# Patient Record
Sex: Male | Born: 1955 | Race: Black or African American | Hispanic: No | Smoking: Current every day smoker
Health system: Southern US, Community
[De-identification: ages and names within clinical notes are randomized; demographics above are authoritative.]

## PROBLEM LIST (undated history)

## (undated) DIAGNOSIS — I5042 Chronic combined systolic (congestive) and diastolic (congestive) heart failure: Secondary | ICD-10-CM

## (undated) DIAGNOSIS — M549 Dorsalgia, unspecified: Secondary | ICD-10-CM

## (undated) DIAGNOSIS — I2510 Atherosclerotic heart disease of native coronary artery without angina pectoris: Secondary | ICD-10-CM

## (undated) DIAGNOSIS — K922 Gastrointestinal hemorrhage, unspecified: Secondary | ICD-10-CM

## (undated) DIAGNOSIS — R001 Bradycardia, unspecified: Secondary | ICD-10-CM

## (undated) DIAGNOSIS — E785 Hyperlipidemia, unspecified: Secondary | ICD-10-CM

## (undated) DIAGNOSIS — K219 Gastro-esophageal reflux disease without esophagitis: Secondary | ICD-10-CM

## (undated) DIAGNOSIS — K2970 Gastritis, unspecified, without bleeding: Secondary | ICD-10-CM

## (undated) DIAGNOSIS — D62 Acute posthemorrhagic anemia: Secondary | ICD-10-CM

## (undated) DIAGNOSIS — S0990XA Unspecified injury of head, initial encounter: Secondary | ICD-10-CM

## (undated) DIAGNOSIS — K2980 Duodenitis without bleeding: Secondary | ICD-10-CM

## (undated) HISTORY — DX: Bradycardia, unspecified: R00.1

## (undated) HISTORY — DX: Gastrointestinal hemorrhage, unspecified: K92.2

## (undated) HISTORY — DX: Unspecified injury of head, initial encounter: S09.90XA

## (undated) HISTORY — DX: Duodenitis without bleeding: K29.80

## (undated) HISTORY — DX: Chronic combined systolic (congestive) and diastolic (congestive) heart failure: I50.42

## (undated) HISTORY — DX: Dorsalgia, unspecified: M54.9

## (undated) HISTORY — DX: Acute posthemorrhagic anemia: D62

## (undated) HISTORY — DX: Gastritis, unspecified, without bleeding: K29.70

---

## 1998-02-10 ENCOUNTER — Encounter: Admission: RE | Admit: 1998-02-10 | Discharge: 1998-02-10 | Payer: Self-pay | Admitting: *Deleted

## 1998-08-17 ENCOUNTER — Encounter: Payer: Self-pay | Admitting: Emergency Medicine

## 1998-08-17 ENCOUNTER — Emergency Department (HOSPITAL_COMMUNITY): Admission: EM | Admit: 1998-08-17 | Discharge: 1998-08-18 | Payer: Self-pay | Admitting: Emergency Medicine

## 1998-09-02 ENCOUNTER — Encounter: Admission: RE | Admit: 1998-09-02 | Discharge: 1998-09-02 | Payer: Self-pay | Admitting: *Deleted

## 2000-11-03 ENCOUNTER — Encounter: Admission: RE | Admit: 2000-11-03 | Discharge: 2000-11-03 | Payer: Self-pay | Admitting: Family Medicine

## 2000-11-03 ENCOUNTER — Encounter: Payer: Self-pay | Admitting: Family Medicine

## 2001-03-06 ENCOUNTER — Emergency Department (HOSPITAL_COMMUNITY): Admission: AC | Admit: 2001-03-06 | Discharge: 2001-03-06 | Payer: Self-pay

## 2001-03-06 ENCOUNTER — Encounter: Payer: Self-pay | Admitting: Emergency Medicine

## 2002-02-28 ENCOUNTER — Encounter: Admission: RE | Admit: 2002-02-28 | Discharge: 2002-02-28 | Payer: Self-pay | Admitting: Family Medicine

## 2002-02-28 ENCOUNTER — Encounter: Payer: Self-pay | Admitting: Family Medicine

## 2002-08-01 HISTORY — PX: FACIAL RECONSTRUCTION SURGERY: SHX631

## 2005-08-01 HISTORY — PX: CARDIAC CATHETERIZATION: SHX172

## 2006-02-22 ENCOUNTER — Encounter: Admission: RE | Admit: 2006-02-22 | Discharge: 2006-02-22 | Payer: Self-pay | Admitting: Family Medicine

## 2006-03-21 ENCOUNTER — Inpatient Hospital Stay (HOSPITAL_BASED_OUTPATIENT_CLINIC_OR_DEPARTMENT_OTHER): Admission: RE | Admit: 2006-03-21 | Discharge: 2006-03-21 | Payer: Self-pay | Admitting: Cardiology

## 2007-08-24 ENCOUNTER — Encounter: Admission: RE | Admit: 2007-08-24 | Discharge: 2007-08-24 | Payer: Self-pay | Admitting: Family Medicine

## 2008-12-04 ENCOUNTER — Emergency Department (HOSPITAL_COMMUNITY): Admission: EM | Admit: 2008-12-04 | Discharge: 2008-12-04 | Payer: Self-pay | Admitting: Emergency Medicine

## 2008-12-08 ENCOUNTER — Emergency Department (HOSPITAL_COMMUNITY): Admission: EM | Admit: 2008-12-08 | Discharge: 2008-12-08 | Payer: Self-pay | Admitting: Orthopaedic Surgery

## 2010-12-14 NOTE — Consult Note (Signed)
NAME:  Cameron Hoover, Cameron Hoover NO.:  [id number]   MEDICAL RECORD NO.:  [id number]          PATIENT TYPE:  EMS   LOCATION:  ED                            FACILITY:  [facility]   PHYSICIAN:  J. Darreld Mclean, M.D. DATE OF BIRTH:  01/18/1956   DATE OF CONSULTATION:  DATE OF DISCHARGE:  12/08/2008                                 CONSULTATION   The patient was seen in the emergency room.   He is a 55 year old male who fell off the ladder on this past Wednesday.  He was seen in the ER on Thursday.  X-ray showed nondisplaced fracture  of the distal ulna on the right distal third junction.  No other  injuries.  He is put in a sugar-tong splint.  He has got more pain and  tenderness with the splint irritating his elbow.  Neurovascular, he is  intact with no other injuries.  I took him out of the splint, he has got  some irritation on the skin on both arms, I think it is from tramadol  that was given from the ER here on Thursday.  He has got pain and  tenderness with supination and pronation.   IMPRESSION:  Fracture, nondisplaced, right ulnar diaphysis, distal  third.   PLAN:  No sugar-tong splint applied.  Prescription, Darvocet-N 100 given  for pain.  I will see him in the office on Thursday of this week.  Precautions given, call if any difficulty.  Keep the cast dry.  Return  if any problem.           ______________________________  Shela Commons. Darreld Mclean, M.D.     JWK/MEDQ  D:  12/08/2008  T:  12/09/2008  Job:  [id number]

## 2010-12-17 NOTE — Op Note (Signed)
[place]. [facility]  Patient:    Cameron Hoover, Cameron Hoover                        MRN: [id number] Proc. Date: 03/06/01 Adm. Date:  [id number] Attending:  Trauma, Md                           Operative Report  PREOPERATIVE DIAGNOSIS: 1. A 2.0 cm complex stellate lower eyelid laceration. 2. A 6.0 cm complex upper and lower lip lacerations. 3. A 5.0 cm complex cheek laceration.  POSTOPERATIVE DIAGNOSIS: 1. A 2.0 cm complex stellate lower eyelid laceration. 2. A 6.0 cm complex upper and lower lip lacerations. 3. A 5.0 cm complex cheek laceration.  OPERATION PERFORMED: 1. A repair of complex stellate 2.0 cm eyelid laceration. 2. A repair of complex 6.0 cm lip laceration, upper and lower lip. 3. A repair of a 5.0 cm complex cheek laceration.  SURGEON:  Teena Irani. Odis Luster, M.D.  ANESTHESIA:  1% Xylocaine with epinephrine plus bicarb.  INDICATIONS FOR PROCEDURE:  This 55 year old man had significant injuries secondary to a saw at work.  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  The saw even went into the gum and damaged teeth.  He was brought to the emergency room where CT scans were performed.  There were no facial fractures noted.  He was given a tetanus shot.  Plastic surgery consultation was obtained for closure of these multiple significant severe stellate facial lacerations.  The nature of the procedure and the risks were understood by the patient.  The potential for further scar revisions later and he has a significant risk of infection. He wished to proceed with the repair.  DESCRIPTION OF PROCEDURE:  The patient was placed in a slight head elevated position.  After satisfactory local anesthesia, he was prepped with Betadine and draped with sterile drapes.  The mucosal closure of the upper lip was performed with 4-0 chromic interrupted chromic sutures leaving the knots to the buccal side.   The wound was then irrigated with copious amounts of saline. Careful inspection failed to reveal any foreign material.  There were several large pulsating blood vessels which had been severed.  These were carefully suture ligated with 4-0 chromic suture in order to avoid later bleeding complications.  Again, thorough irrigation with saline and the layered closure was begun beginning with 4-0 chromic interrupted inverted suture for the orbicularis oris muscle.  The wet vermilion was approximated with 4-0 chromic simple interrupted sutures and the dry vermilion with 6-0 Prolene simple interrupted sutures taking great care to align his vermilion border of his lip.  The eyelid lacerations and cheek lacerations were thoroughly irrigated and there was a layered closure with 4-0 Vicryl interrupted inverted deep sutures for the muscle followed by 4-0 Vicryl interrupted inverted subcutaneous sutures and deep dermal sutures.  There was thorough irrigation with saline in between the closure of each layer.  6-0 Prolene simple interrupted sutures to piece together the stellate laceration up at the lower eyelid.  6-0 Prolene simple running suture to complete the cheek laceration closure.  Lower lip closed 5-0 Prolene simple interrupted sutures after thoroughly irrigating with saline and carefully aligning the vermilion border. Antibiotic ointment was applied and he tolerated the procedure well.  DISPOSITION:  1. He will be given one more gram of Ancef in the  emergency room prior to      discharge.  2. Keep his head elevated.  3. No lifting, no vigorous activities, no working yet.  4. Keflex 500 mg p.o. q.i.d.  5. Percocet total of 15 given 1-2 q.4h. p.r.n. pain.  6. He needs to see his dentist as soon as possible.  Recommendations were     made to the emergency department tonight regarding possibility of     oral surgery consultation tonight but it was decided that a dental     appointment tomorrow  would be sufficient.  7. Soft diet.  8. Rinse with saline rinses seven times a day and after meals.  9. He may shower tomorrow evening, continue to reapply antibiotic ointment     at least twice a day for three days and stop. 10. See him back in the office in one week for recheck or sooner if there     are problems or concerns.  His wife knows to expect a great deal of     swelling and drainage in the meantime. DD:  03/06/01 TD:  03/07/01 Job: [id number]

## 2010-12-17 NOTE — H&P (Signed)
NAME:  Cameron Hoover, Cameron Hoover NO.:  [id number]   MEDICAL RECORD NO.:  [id number]          PATIENT TYPE:  OIB   LOCATION:  NA                           FACILITY:  [facility]   PHYSICIAN:  Peter M. Swaziland, M.D.  DATE OF BIRTH:  02-07-1956   DATE OF ADMISSION:  03/21/2006  DATE OF DISCHARGE:                                HISTORY & PHYSICAL   HISTORY OF PRESENT ILLNESS:  Cameron Hoover is a 55 year old black male who is  seen for evaluation of dyspnea and chest pain.  The patient states he has  had a four to six-week history of pain in the center of his chest.  This may  current any time.  He describes it as a pressure sensation sometimes  associated with shortness of breath and typically improved if he gets up and  drinks something.  It has awoken him from sleep at night.  He got some type  of stomach medicine at [facility] which he states helped.  He does have a  known history of gastroesophageal reflux disease.  He did have a cardiac  catheterization in 1995 which showed 30-40% stenosis in the mid LAD, but  otherwise no significant coronary disease.  Patient's cardiac risk factors  include a history of tobacco abuse and hypercholesterolemia.  On March 09, 2006 the patient had a stress Cardiolite study.  He was able to exercise for  12 minutes on the Rakeen protocol and denied any chest pain.  He had no  significant ST-segment changes.  However, his Cardiolite images demonstrated  a significant reversible defect involving the distal anterior wall and apex  consistent with ischemia and his ejection fraction was 52%.  Because of  these findings he is now admitted for diagnostic cardiac catheterization.   PAST MEDICAL HISTORY:  1. Gastroesophageal reflux disease.  2. History of laceration to his face in 2003.  3. Hypercholesterolemia.   ALLERGIES:  No known allergies.   CURRENT MEDICATIONS:  1. Lipitor 20 mg per day.  2. Protonix 20 mg per day.  3. Fish oil supplement daily.   SOCIAL HISTORY:  Patient works as a Engineer, maintenance.  He is married.  He  has one child and three step-children.  He smokes one and a half packs per  day.  He has been a smoker since 69.  He drinks beer on a daily basis.   FAMILY HISTORY:  Father died of lung cancer and emphysema.  Mother is age 55  and has hypertension and diabetes.  One brother died in his 54s of a heart  attack.  Three other brothers have had coronary disease, two had had heart  attacks, one has had bypass surgery.  One brother has had brain cancer.  One  sister has diabetes.   REVIEW OF SYSTEMS:  Otherwise unremarkable.   PHYSICAL EXAMINATION:  GENERAL:  Patient is a middle-aged black male in no  distress.  VITAL SIGNS:  His weight is 186, blood pressure is 110/60, pulse 72 and  regular.  HEENT:  He is normocephalic, atraumatic.  Pupils equal, round, reactive to  light and accommodation.  Extraocular movements are full.  Oropharynx is  clear.  Neck is without JVD, adenopathy, thyromegaly, or bruits.  LUNGS:  Clear to auscultation, percussion.  CARDIAC:  Regular rate and rhythm with a grade 2/6 systolic murmur heard  best in the aortic area radiating to the carotids and to the sternal border.  There is no S3.  ABDOMEN:  Soft and nontender.  He has no masses or bruits.  EXTREMITIES:  Without edema.  Pulses are 2+ and symmetric.  NEUROLOGIC:  Normal.   LABORATORY DATA:  Chest x-ray showed chronic bronchitic changes without  active disease.  ECG showed nonspecific T-wave abnormalities in the lateral  leads.  Coags were normal.  Glucose 72, BUN 21, creatinine 1.1.  Electrolytes are normal.  CBC is normal.   IMPRESSION:  1. Chest pain with multiple cardiac risk factors.  Abnormal stress      Cardiolite study demonstrating anterior apical ischemia at high work      load.  2. Aortic flow murmur.  3. Tobacco abuse.  4. Hypercholesterolemia.  5. Family history of coronary disease.   PLAN:  Proceed with  diagnostic cardiac catheterization.           ______________________________  Peter M. Swaziland, M.D.     PMJ/MEDQ  D:  03/20/2006  T:  03/20/2006  Job:  [id number]   cc:   Donia Guiles, M.D.

## 2010-12-17 NOTE — Cardiovascular Report (Signed)
NAME:  Cameron Hoover, Cameron Hoover NO.:  [id number]   MEDICAL RECORD NO.:  [id number]          PATIENT TYPE:  OIB   LOCATION:  NA                           FACILITY:  [facility]   PHYSICIAN:  Peter M. Swaziland, M.D.  DATE OF BIRTH:  1955-11-26   DATE OF PROCEDURE:  03/27/2006  DATE OF DISCHARGE:                              CARDIAC CATHETERIZATION   INDICATIONS FOR PROCEDURE:  55 year old black male with a history of  hypercholesterolemia presents with symptoms of chest pain.  On stress  Cardiolite study, he was able to exercise for 12 minutes on the Hudsyn  protocol without ECG changes or chest pain.  However, his Cardiolite study  did demonstrate a reversible anterior apical defect.   PROCEDURE:  Left heart catheterization, coronary angiography.   EQUIPMENT USED:  4-French 4 cm left Judkins catheter, 4-French right  catheter, 4-French pigtail catheter, 4-French arterial sheath   MEDICATIONS:  Local anesthesia with 1% Xylocaine.   CONTRAST:  90 mL of Omnipaque.   HEMODYNAMIC DATA:  Aortic pressure is 123/70 with mean of 91 mmHg. Left  ventricular pressure was 121 with EDP of 21 mmHg.   ANGIOGRAPHIC DATA:  The left coronary arises and distributes normally.   The left main coronary demonstrates a 20-30% narrowing in the distal vessel.  The left anterior descending artery is widely patent throughout with only  minor irregularities in the proximal vessel less than 10%.  There is a  moderate-sized intermediate vessel which has 20% narrowing in the proximal  vessel.   The left circumflex coronary appears normal.   The right coronary arises and distributes normally.  It has a focal 30-40%  narrowing in the mid vessel.   Left ventricular angiography demonstrates normal left ventricular size and  contractility with normal systolic function.  Ejection fraction is estimated  at 55%.  There is no mitral regurgitation prolapse.   FINAL INTERPRETATION:  1. Mild nonobstructive  atherosclerotic coronary artery disease.  2. Normal left ventricular function.   PLAN:  Based on these findings, I would recommend continued risk factor  modification.           ______________________________  Peter M. Swaziland, M.D.     PMJ/MEDQ  D:  03/21/2006  T:  03/21/2006  Job:  [id number]   cc:   Donia Guiles, M.D.

## 2015-05-02 DIAGNOSIS — I219 Acute myocardial infarction, unspecified: Secondary | ICD-10-CM

## 2015-05-02 HISTORY — DX: Acute myocardial infarction, unspecified: I21.9

## 2015-05-28 ENCOUNTER — Inpatient Hospital Stay (HOSPITAL_COMMUNITY)
Admission: EM | Admit: 2015-05-28 | Discharge: 2015-05-30 | DRG: 246 | Disposition: A | Discharge status: Home / Self Care | Payer: BLUE CROSS/BLUE SHIELD | Source: Other Acute Inpatient Hospital | Attending: Internal Medicine | Admitting: Internal Medicine

## 2015-05-28 ENCOUNTER — Encounter (HOSPITAL_COMMUNITY): Payer: Self-pay | Admitting: Emergency Medicine

## 2015-05-28 ENCOUNTER — Encounter (HOSPITAL_COMMUNITY)
Admission: EM | Disposition: A | Discharge status: Home / Self Care | Payer: Self-pay | Source: Other Acute Inpatient Hospital | Attending: Internal Medicine

## 2015-05-28 ENCOUNTER — Emergency Department (HOSPITAL_COMMUNITY): Payer: BLUE CROSS/BLUE SHIELD

## 2015-05-28 ENCOUNTER — Ambulatory Visit (HOSPITAL_COMMUNITY): Payer: BLUE CROSS/BLUE SHIELD

## 2015-05-28 DIAGNOSIS — I110 Hypertensive heart disease with heart failure: Secondary | ICD-10-CM | POA: Diagnosis present

## 2015-05-28 DIAGNOSIS — I214 Non-ST elevation (NSTEMI) myocardial infarction: Principal | ICD-10-CM | POA: Diagnosis present

## 2015-05-28 DIAGNOSIS — J9601 Acute respiratory failure with hypoxia: Secondary | ICD-10-CM | POA: Diagnosis present

## 2015-05-28 DIAGNOSIS — E785 Hyperlipidemia, unspecified: Secondary | ICD-10-CM | POA: Diagnosis present

## 2015-05-28 DIAGNOSIS — Z955 Presence of coronary angioplasty implant and graft: Secondary | ICD-10-CM | POA: Diagnosis present

## 2015-05-28 DIAGNOSIS — R001 Bradycardia, unspecified: Secondary | ICD-10-CM | POA: Diagnosis not present

## 2015-05-28 DIAGNOSIS — Z23 Encounter for immunization: Secondary | ICD-10-CM

## 2015-05-28 DIAGNOSIS — I213 ST elevation (STEMI) myocardial infarction of unspecified site: Secondary | ICD-10-CM

## 2015-05-28 DIAGNOSIS — J189 Pneumonia, unspecified organism: Secondary | ICD-10-CM | POA: Diagnosis not present

## 2015-05-28 DIAGNOSIS — R079 Chest pain, unspecified: Secondary | ICD-10-CM | POA: Diagnosis not present

## 2015-05-28 DIAGNOSIS — R0902 Hypoxemia: Secondary | ICD-10-CM

## 2015-05-28 DIAGNOSIS — R918 Other nonspecific abnormal finding of lung field: Secondary | ICD-10-CM | POA: Diagnosis not present

## 2015-05-28 DIAGNOSIS — F17210 Nicotine dependence, cigarettes, uncomplicated: Secondary | ICD-10-CM | POA: Diagnosis present

## 2015-05-28 DIAGNOSIS — I25119 Atherosclerotic heart disease of native coronary artery with unspecified angina pectoris: Secondary | ICD-10-CM | POA: Diagnosis present

## 2015-05-28 DIAGNOSIS — J810 Acute pulmonary edema: Secondary | ICD-10-CM | POA: Diagnosis present

## 2015-05-28 DIAGNOSIS — R011 Cardiac murmur, unspecified: Secondary | ICD-10-CM | POA: Diagnosis present

## 2015-05-28 DIAGNOSIS — R0602 Shortness of breath: Secondary | ICD-10-CM | POA: Diagnosis not present

## 2015-05-28 DIAGNOSIS — D72829 Elevated white blood cell count, unspecified: Secondary | ICD-10-CM

## 2015-05-28 DIAGNOSIS — I5021 Acute systolic (congestive) heart failure: Secondary | ICD-10-CM | POA: Diagnosis present

## 2015-05-28 DIAGNOSIS — J209 Acute bronchitis, unspecified: Secondary | ICD-10-CM | POA: Diagnosis present

## 2015-05-28 DIAGNOSIS — Z720 Tobacco use: Secondary | ICD-10-CM | POA: Diagnosis present

## 2015-05-28 DIAGNOSIS — I209 Angina pectoris, unspecified: Secondary | ICD-10-CM | POA: Diagnosis present

## 2015-05-28 DIAGNOSIS — I2510 Atherosclerotic heart disease of native coronary artery without angina pectoris: Secondary | ICD-10-CM | POA: Diagnosis not present

## 2015-05-28 DIAGNOSIS — R042 Hemoptysis: Secondary | ICD-10-CM | POA: Diagnosis present

## 2015-05-28 HISTORY — PX: CARDIAC CATHETERIZATION: SHX172

## 2015-05-28 HISTORY — DX: Hyperlipidemia, unspecified: E78.5

## 2015-05-28 HISTORY — DX: Atherosclerotic heart disease of native coronary artery without angina pectoris: I25.10

## 2015-05-28 LAB — COMPREHENSIVE METABOLIC PANEL
ALK PHOS: 113 U/L (ref 38–126)
ALT: 26 U/L (ref 17–63)
ANION GAP: 9 (ref 5–15)
AST: 31 U/L (ref 15–41)
Albumin: 3.1 g/dL — ABNORMAL LOW (ref 3.5–5.0)
BILIRUBIN TOTAL: 0.5 mg/dL (ref 0.3–1.2)
BUN: 13 mg/dL (ref 6–20)
CALCIUM: 8.7 mg/dL — AB (ref 8.9–10.3)
CO2: 22 mmol/L (ref 22–32)
Chloride: 108 mmol/L (ref 101–111)
Creatinine, Ser: 0.88 mg/dL (ref 0.61–1.24)
Glucose, Bld: 110 mg/dL — ABNORMAL HIGH (ref 65–99)
Potassium: 4.3 mmol/L (ref 3.5–5.1)
SODIUM: 139 mmol/L (ref 135–145)
TOTAL PROTEIN: 7.1 g/dL (ref 6.5–8.1)

## 2015-05-28 LAB — I-STAT ARTERIAL BLOOD GAS, ED
ACID-BASE DEFICIT: 3 mmol/L — AB (ref 0.0–2.0)
Bicarbonate: 21.6 mEq/L (ref 20.0–24.0)
O2 Saturation: 87 %
PH ART: 7.364 (ref 7.350–7.450)
Patient temperature: 98.7
TCO2: 23 mmol/L (ref 0–100)
pCO2 arterial: 37.9 mmHg (ref 35.0–45.0)
pO2, Arterial: 55 mmHg — ABNORMAL LOW (ref 80.0–100.0)

## 2015-05-28 LAB — CBC WITH DIFFERENTIAL/PLATELET
Basophils Absolute: 0 10*3/uL (ref 0.0–0.1)
Basophils Relative: 0 %
EOS ABS: 0.1 10*3/uL (ref 0.0–0.7)
Eosinophils Relative: 1 %
HCT: 46.6 % (ref 39.0–52.0)
HEMOGLOBIN: 15.2 g/dL (ref 13.0–17.0)
LYMPHS ABS: 2.1 10*3/uL (ref 0.7–4.0)
Lymphocytes Relative: 16 %
MCH: 30.8 pg (ref 26.0–34.0)
MCHC: 32.6 g/dL (ref 30.0–36.0)
MCV: 94.3 fL (ref 78.0–100.0)
MONOS PCT: 2 %
Monocytes Absolute: 0.3 10*3/uL (ref 0.1–1.0)
NEUTROS PCT: 81 %
Neutro Abs: 10.6 10*3/uL — ABNORMAL HIGH (ref 1.7–7.7)
Platelets: 284 10*3/uL (ref 150–400)
RBC: 4.94 MIL/uL (ref 4.22–5.81)
RDW: 13.9 % (ref 11.5–15.5)
WBC: 13.1 10*3/uL — ABNORMAL HIGH (ref 4.0–10.5)

## 2015-05-28 LAB — TROPONIN I
TROPONIN I: 0.28 ng/mL — AB (ref <0.031)
TROPONIN I: 1.37 ng/mL — AB (ref <0.031)
TROPONIN I: 1.96 ng/mL — AB (ref <0.031)
Troponin I: 2.78 ng/mL (ref <0.031)

## 2015-05-28 LAB — POCT ACTIVATED CLOTTING TIME: Activated Clotting Time: 546 seconds

## 2015-05-28 LAB — MRSA PCR SCREENING: MRSA BY PCR: NEGATIVE

## 2015-05-28 LAB — D-DIMER, QUANTITATIVE (NOT AT ARMC): D DIMER QUANT: 0.82 ug{FEU}/mL — AB (ref 0.00–0.48)

## 2015-05-28 LAB — HEPARIN LEVEL (UNFRACTIONATED): Heparin Unfractionated: 0.22 IU/mL — ABNORMAL LOW (ref 0.30–0.70)

## 2015-05-28 LAB — I-STAT CG4 LACTIC ACID, ED: LACTIC ACID, VENOUS: 1.75 mmol/L (ref 0.5–2.0)

## 2015-05-28 LAB — BRAIN NATRIURETIC PEPTIDE: B NATRIURETIC PEPTIDE 5: 133.1 pg/mL — AB (ref 0.0–100.0)

## 2015-05-28 LAB — PROCALCITONIN: PROCALCITONIN: 0.17 ng/mL

## 2015-05-28 SURGERY — INVASIVE LAB ABORTED CASE

## 2015-05-28 SURGERY — LEFT HEART CATH AND CORS/GRAFTS ANGIOGRAPHY
Anesthesia: LOCAL

## 2015-05-28 MED ORDER — LIDOCAINE HCL (PF) 1 % IJ SOLN
INTRAMUSCULAR | Status: AC
  Filled 2015-05-28: qty 30

## 2015-05-28 MED ORDER — NITROGLYCERIN IN D5W 200-5 MCG/ML-% IV SOLN
5.0000 ug/min | INTRAVENOUS | Status: DC
  Administered 2015-05-28: 10 ug/min via INTRAVENOUS
  Filled 2015-05-28 (×2): qty 250

## 2015-05-28 MED ORDER — FENTANYL CITRATE (PF) 100 MCG/2ML IJ SOLN
INTRAMUSCULAR | Status: DC | PRN
  Administered 2015-05-28: 25 ug via INTRAVENOUS

## 2015-05-28 MED ORDER — SODIUM CHLORIDE 0.9 % IV SOLN: 250.0000 mL | INTRAVENOUS | Status: DC | PRN

## 2015-05-28 MED ORDER — TICAGRELOR 90 MG PO TABS
90.0000 mg | ORAL_TABLET | Freq: Two times a day (BID) | ORAL | Status: DC
  Administered 2015-05-28 – 2015-05-30 (×4): 90 mg via ORAL
  Filled 2015-05-28 (×4): qty 1

## 2015-05-28 MED ORDER — TICAGRELOR 90 MG PO TABS
ORAL_TABLET | ORAL | Status: DC | PRN
  Administered 2015-05-28: 180 mg via ORAL

## 2015-05-28 MED ORDER — SODIUM CHLORIDE 0.9 % WEIGHT BASED INFUSION: 3.0000 mL/kg/h | INTRAVENOUS | Status: DC

## 2015-05-28 MED ORDER — AZITHROMYCIN 500 MG PO TABS
500.0000 mg | ORAL_TABLET | ORAL | Status: DC
  Administered 2015-05-28 – 2015-05-30 (×3): 500 mg via ORAL
  Filled 2015-05-28 (×2): qty 1
  Filled 2015-05-28: qty 2
  Filled 2015-05-28: qty 1

## 2015-05-28 MED ORDER — SODIUM CHLORIDE 0.9 % IV SOLN
INTRAVENOUS | Status: AC
  Administered 2015-05-28: 14:00:00 via INTRAVENOUS

## 2015-05-28 MED ORDER — HEPARIN SODIUM (PORCINE) 1000 UNIT/ML IJ SOLN
INTRAMUSCULAR | Status: DC | PRN
  Administered 2015-05-28: 4000 [IU] via INTRAVENOUS

## 2015-05-28 MED ORDER — DEXTROSE 5 % IV SOLN
1.0000 g | INTRAVENOUS | Status: DC
  Administered 2015-05-28 – 2015-05-30 (×3): 1 g via INTRAVENOUS
  Filled 2015-05-28 (×3): qty 10

## 2015-05-28 MED ORDER — FUROSEMIDE 10 MG/ML IJ SOLN
60.0000 mg | Freq: Once | INTRAMUSCULAR | Status: AC
  Administered 2015-05-28: 60 mg via INTRAVENOUS
  Filled 2015-05-28: qty 6

## 2015-05-28 MED ORDER — PNEUMOCOCCAL VAC POLYVALENT 25 MCG/0.5ML IJ INJ
0.5000 mL | INJECTION | INTRAMUSCULAR | Status: AC
  Administered 2015-05-29: 0.5 mL via INTRAMUSCULAR
  Filled 2015-05-28: qty 0.5

## 2015-05-28 MED ORDER — SODIUM CHLORIDE 0.9 % IJ SOLN: 3.0000 mL | INTRAMUSCULAR | Status: DC | PRN

## 2015-05-28 MED ORDER — HEPARIN SODIUM (PORCINE) 1000 UNIT/ML IJ SOLN
INTRAMUSCULAR | Status: AC
  Filled 2015-05-28: qty 1

## 2015-05-28 MED ORDER — ACETAMINOPHEN 325 MG PO TABS: 650.0000 mg | ORAL_TABLET | ORAL | Status: DC | PRN

## 2015-05-28 MED ORDER — ASPIRIN 81 MG PO CHEW
81.0000 mg | CHEWABLE_TABLET | ORAL | Status: DC
  Filled 2015-05-28: qty 1

## 2015-05-28 MED ORDER — SODIUM CHLORIDE 0.9 % IJ SOLN: 3.0000 mL | Freq: Two times a day (BID) | INTRAMUSCULAR | Status: DC

## 2015-05-28 MED ORDER — NITROGLYCERIN 1 MG/10 ML FOR IR/CATH LAB
INTRA_ARTERIAL | Status: DC | PRN
  Administered 2015-05-28 (×2): 200 ug

## 2015-05-28 MED ORDER — NITROGLYCERIN 1 MG/10 ML FOR IR/CATH LAB
INTRA_ARTERIAL | Status: DC | PRN
  Administered 2015-05-28: 13:00:00

## 2015-05-28 MED ORDER — TICAGRELOR 90 MG PO TABS
ORAL_TABLET | ORAL | Status: AC
  Filled 2015-05-28: qty 1

## 2015-05-28 MED ORDER — NITROGLYCERIN 1 MG/10 ML FOR IR/CATH LAB
INTRA_ARTERIAL | Status: AC
  Filled 2015-05-28: qty 10

## 2015-05-28 MED ORDER — BIVALIRUDIN BOLUS VIA INFUSION - CUPID
INTRAVENOUS | Status: DC | PRN
  Administered 2015-05-28: 55.125 mg via INTRAVENOUS

## 2015-05-28 MED ORDER — IOHEXOL 350 MG/ML SOLN
INTRAVENOUS | Status: DC | PRN
  Administered 2015-05-28 (×2): 100 mL via INTRAVENOUS

## 2015-05-28 MED ORDER — VERAPAMIL HCL 2.5 MG/ML IV SOLN
INTRA_ARTERIAL | Status: DC | PRN
  Administered 2015-05-28: 12:00:00 via INTRA_ARTERIAL

## 2015-05-28 MED ORDER — ATORVASTATIN CALCIUM 80 MG PO TABS
80.0000 mg | ORAL_TABLET | Freq: Every day | ORAL | Status: DC
  Administered 2015-05-29: 80 mg via ORAL
  Filled 2015-05-28: qty 1

## 2015-05-28 MED ORDER — ONDANSETRON HCL 4 MG/2ML IJ SOLN: 4.0000 mg | Freq: Four times a day (QID) | INTRAMUSCULAR | Status: DC | PRN

## 2015-05-28 MED ORDER — MIDAZOLAM HCL 2 MG/2ML IJ SOLN
INTRAMUSCULAR | Status: AC
  Filled 2015-05-28: qty 4

## 2015-05-28 MED ORDER — SODIUM CHLORIDE 0.9 % IV SOLN
250.0000 mg | INTRAVENOUS | Status: DC | PRN
  Administered 2015-05-28: 1.75 mg/kg/h via INTRAVENOUS

## 2015-05-28 MED ORDER — ONDANSETRON HCL 4 MG/2ML IJ SOLN
4.0000 mg | Freq: Four times a day (QID) | INTRAMUSCULAR | Status: DC | PRN
  Administered 2015-05-28: 4 mg via INTRAVENOUS
  Filled 2015-05-28: qty 2

## 2015-05-28 MED ORDER — HEPARIN (PORCINE) IN NACL 100-0.45 UNIT/ML-% IJ SOLN
1000.0000 [IU]/h | INTRAMUSCULAR | Status: DC
  Administered 2015-05-28: 1000 [IU]/h via INTRAVENOUS
  Filled 2015-05-28 (×2): qty 250

## 2015-05-28 MED ORDER — SODIUM CHLORIDE 0.9 % WEIGHT BASED INFUSION: 1.0000 mL/kg/h | INTRAVENOUS | Status: DC

## 2015-05-28 MED ORDER — ASPIRIN EC 81 MG PO TBEC: 81.0000 mg | DELAYED_RELEASE_TABLET | Freq: Every day | ORAL | Status: DC

## 2015-05-28 MED ORDER — FENTANYL CITRATE (PF) 100 MCG/2ML IJ SOLN
INTRAMUSCULAR | Status: AC
  Filled 2015-05-28: qty 4

## 2015-05-28 MED ORDER — HEPARIN (PORCINE) IN NACL 2-0.9 UNIT/ML-% IJ SOLN
INTRAMUSCULAR | Status: AC
  Filled 2015-05-28: qty 1500

## 2015-05-28 MED ORDER — LIDOCAINE HCL (PF) 1 % IJ SOLN
INTRAMUSCULAR | Status: DC | PRN
  Administered 2015-05-28: 3 mL

## 2015-05-28 MED ORDER — MIDAZOLAM HCL 2 MG/2ML IJ SOLN
INTRAMUSCULAR | Status: DC | PRN
  Administered 2015-05-28: 2 mg via INTRAVENOUS

## 2015-05-28 MED ORDER — ASPIRIN EC 81 MG PO TBEC
81.0000 mg | DELAYED_RELEASE_TABLET | Freq: Every day | ORAL | Status: DC
  Administered 2015-05-29 – 2015-05-30 (×2): 81 mg via ORAL
  Filled 2015-05-28 (×2): qty 1

## 2015-05-28 MED ORDER — ANGIOPLASTY BOOK
Freq: Once | Status: AC
  Administered 2015-05-28: 20:00:00
  Filled 2015-05-28: qty 1

## 2015-05-28 MED ORDER — ASPIRIN 81 MG PO CHEW
81.0000 mg | CHEWABLE_TABLET | ORAL | Status: AC
  Administered 2015-05-28: 81 mg via ORAL

## 2015-05-28 MED ORDER — INFLUENZA VAC SPLIT QUAD 0.5 ML IM SUSY
0.5000 mL | PREFILLED_SYRINGE | INTRAMUSCULAR | Status: AC
  Administered 2015-05-29: 15:00:00 0.5 mL via INTRAMUSCULAR
  Filled 2015-05-28: qty 0.5

## 2015-05-28 MED ORDER — SODIUM CHLORIDE 0.9 % IJ SOLN
3.0000 mL | Freq: Two times a day (BID) | INTRAMUSCULAR | Status: DC
  Administered 2015-05-28: 3 mL via INTRAVENOUS

## 2015-05-28 MED ORDER — BIVALIRUDIN 250 MG IV SOLR
INTRAVENOUS | Status: AC
  Filled 2015-05-28: qty 250

## 2015-05-28 MED ORDER — VERAPAMIL HCL 2.5 MG/ML IV SOLN
INTRAVENOUS | Status: AC
  Filled 2015-05-28: qty 2

## 2015-05-28 SURGICAL SUPPLY — 19 items
BALLN EUPHORA RX 2.5X12 (BALLOONS) ×3
BALLN ~~LOC~~ EUPHORA RX 3.25X15 (BALLOONS) ×3
BALLOON EUPHORA RX 2.5X12 (BALLOONS) IMPLANT
BALLOON ~~LOC~~ EUPHORA RX 3.25X15 (BALLOONS) IMPLANT
CATH INFINITI 5 FR JL3.5 (CATHETERS) ×3 IMPLANT
CATH INFINITI 5FR ANG PIGTAIL (CATHETERS) ×2 IMPLANT
CATH OPTITORQUE TIG 4.0 5F (CATHETERS) ×2 IMPLANT
CATH VISTA GUIDE 6FR XBLAD3.5 (CATHETERS) ×2 IMPLANT
DEVICE RAD COMP TR BAND LRG (VASCULAR PRODUCTS) ×3 IMPLANT
GLIDESHEATH SLEND A-KIT 6F 22G (SHEATH) ×3 IMPLANT
KIT ENCORE 26 ADVANTAGE (KITS) ×2 IMPLANT
KIT HEART LEFT (KITS) ×3 IMPLANT
PACK CARDIAC CATHETERIZATION (CUSTOM PROCEDURE TRAY) ×3 IMPLANT
STENT RESOLUTE INTEG 3.0X26 (Permanent Stent) ×2 IMPLANT
SYR MEDRAD MARK V 150ML (SYRINGE) ×2 IMPLANT
TRANSDUCER W/STOPCOCK (MISCELLANEOUS) ×3 IMPLANT
TUBING CIL FLEX 10 FLL-RA (TUBING) ×3 IMPLANT
WIRE ASAHI PROWATER 180CM (WIRE) ×2 IMPLANT
WIRE SAFE-T 1.5MM-J .035X260CM (WIRE) ×3 IMPLANT

## 2015-05-28 NOTE — Progress Notes (Signed)
Utilization review completed. Kanisha Duba, RN, BSN. 

## 2015-05-28 NOTE — ED Notes (Signed)
Pt arrived by EMS who originally picked him up from work and took him to [facility]. [facility] called STEMI, started heparin and sent here. EMS states [facility] refused to send EKG to [facility] prior to calling STEMI and sending patient. EMS states pt said he has had chest pain for 3 months, had 9 teeth pulled yesterday and started coughing up blood tonight. Last vitals BP 132/79, P 84, RR 36

## 2015-05-28 NOTE — Progress Notes (Signed)
Eating turkey sandwich 

## 2015-05-28 NOTE — Progress Notes (Signed)
D-dimer .82, MD notified. Pt resting comfortably

## 2015-05-28 NOTE — Progress Notes (Signed)
Interval coverage note: Please see overnight fellow's note for more information  Pt with PMH of tobacco abuse, nonobstructive CAD on cath ('95 and '07) who were last seen by Dr. Swaziland in 2007 presented with severe dyspnea, inability to lie flat, recent hemoptysis and minimal chest discomfort. Initially transferred as anterior STEMI, STEMI cancelled on further review.   Subjective:   Continue to have mild CP and SOB at the same time intermittently. Currently CP 2/10, better than before. Has been coughing, but could not tell me how long. Recently had teeth pulled  Physical exam: Lung: Bibasilar rale.  Heart: RRR, 2/6 systolic murmur General: NAD Neuro: alert and oriented x4  Plan:  Continue to trend trop. Obtain echo this morning, noted 2/6 systolic murmur which is known to the pt Low suspicion for PE as HR 70-80s overnight despite not on any AV nodal blocking agent. Will order d-dimer, but if has PNA, d-dimer may mildly elevated.  Will give single dose of lasix to see if help with SOB Either way, given severe TWI in lateral leads, will need cardiac catheterization, likely tomorrow if no recurrent hemoptysis on IV heparin.  ?hemoptysis related to teeth pull vs true hemoptysis.   Ramond Dial PA Pager: [phone]  The patient was seen, examined and discussed with Azalee Course, PA-C and I agree with the above.   The patient was admitted ad STEMI that was cancelled. He continues to have chest pain. His Troponin is elevated 0.28, on iv Heparin, D-dimer is negative, we will schedule for a cardiac cath today.  Lars Masson 05/28/2015

## 2015-05-28 NOTE — ED Provider Notes (Signed)
CSN: [id number]     Arrival date & time 05/28/15  0350 History   First MD Initiated Contact with Patient 05/28/15 0413     Chief Complaint  Patient presents with  . Chest Pain     (Consider location/radiation/quality/duration/timing/severity/associated sxs/prior Treatment) HPI Patient presents as transfer from [facility] emergency department as code STEMI. Dr. Katrinka Blazing at bedside to evaluate patient. Patient was at work this evening developed left-sided chest pain with shortness of breath, nausea and diaphoresis. Take to the emergency department where he had ST segment elevations in leads V3, V4. Chest x-ray showed bilateral pulmonary infiltrates. Was given aspirin, heparin bolus and started on heparin drip. Patient now describes only very mild substernal discomfort. He continues to have difficulty breathing requiring supplement oxygen. Denies lower extremity swelling or pain. Denies fever or chills. States he's had increasing shortness of breath over the last few months. Has not seen a physician for the last 6 years. Strong family history cornea artery disease. One pack per day smoker for the past 46 years. History reviewed. No pertinent past medical history. History reviewed. No pertinent past surgical history. Family History  Problem Relation Age of Onset  . Cancer Mother   . Diabetes Mother   . Cancer Father    Social History  Substance Use Topics  . Smoking status: Current Every Day Smoker -- 1.00 packs/day for 46 years  . Smokeless tobacco: Never Used  . Alcohol Use: 8.4 oz/week    14 Cans of beer per week    Review of Systems  Constitutional: Positive for diaphoresis. Negative for fever and chills.  Respiratory: Positive for cough and shortness of breath.   Cardiovascular: Positive for chest pain. Negative for palpitations and leg swelling.  Gastrointestinal: Positive for nausea. Negative for vomiting, abdominal pain and diarrhea.  Musculoskeletal: Negative for back pain, neck  pain and neck stiffness.  Skin: Negative for rash and wound.  Neurological: Negative for dizziness, weakness, light-headedness, numbness and headaches.  All other systems reviewed and are negative.     Allergies  Review of patient's allergies indicates no known allergies.  Home Medications   Prior to Admission medications   Medication Sig Start Date End Date Taking? Authorizing Provider  HYDROcodone-acetaminophen (NORCO/VICODIN) 5-325 MG tablet Take 1 tablet by mouth every 6 (six) hours as needed for moderate pain (dental pain).   Yes Historical Provider, MD   BP 133/68 mmHg  Pulse 73  Temp(Src) 98.7 F (37.1 C) (Oral)  Resp 28  SpO2 95% Physical Exam  Constitutional: He is oriented to person, place, and time. He appears well-developed and well-nourished. No distress.  HENT:  Head: Normocephalic and atraumatic.  Mouth/Throat: Oropharynx is clear and moist. No oropharyngeal exudate.  Eyes: EOM are normal. Pupils are equal, round, and reactive to light.  Neck: Normal range of motion. Neck supple.  Cardiovascular: Normal rate and regular rhythm.  Exam reveals no gallop and no friction rub.   No murmur heard. Pulmonary/Chest: No respiratory distress. He has no wheezes. He has rales. He exhibits no tenderness.  Increased respiratory effort. Crackles throughout all lung fields.  Abdominal: Soft. Bowel sounds are normal. He exhibits no distension and no mass. There is no tenderness. There is no rebound and no guarding.  Musculoskeletal: Normal range of motion. He exhibits no edema or tenderness.  No lower extremity swelling or pain.  Neurological: He is alert and oriented to person, place, and time.  Patient states speaking in full sentences. Moves all extremities without deficit. Sensation is  grossly intact.  Skin: Skin is warm and dry. No rash noted. No erythema.  Psychiatric: He has a normal mood and affect. His behavior is normal.  Nursing note and vitals reviewed.   ED  Course  Procedures (including critical care time) Labs Review Labs Reviewed  TROPONIN I - Abnormal; Notable for the following:    Troponin I 0.28 (*)    All other components within normal limits  CBC WITH DIFFERENTIAL/PLATELET - Abnormal; Notable for the following:    WBC 13.1 (*)    Neutro Abs 10.6 (*)    All other components within normal limits  COMPREHENSIVE METABOLIC PANEL - Abnormal; Notable for the following:    Glucose, Bld 110 (*)    Calcium 8.7 (*)    Albumin 3.1 (*)    All other components within normal limits  I-STAT ARTERIAL BLOOD GAS, ED - Abnormal; Notable for the following:    pO2, Arterial 55.0 (*)    Acid-base deficit 3.0 (*)    All other components within normal limits  CULTURE, BLOOD (ROUTINE X 2)  CULTURE, BLOOD (ROUTINE X 2)  BRAIN NATRIURETIC PEPTIDE  I-STAT CG4 LACTIC ACID, ED    Imaging Review Dg Chest Port 1 View  05/28/2015  CLINICAL DATA:  Acute onset of shortness of breath. Initial encounter. EXAM: PORTABLE CHEST 1 VIEW COMPARISON:  Chest radiograph performed earlier today at 2:52 a.m. FINDINGS: The lungs are well-aerated. Persistent bibasilar airspace opacities are perhaps slightly improved from the prior study, and may reflect pulmonary edema or pneumonia. There is no evidence of pleural effusion or pneumothorax. The cardiomediastinal silhouette is within normal limits. No acute osseous abnormalities are seen. IMPRESSION: Persistent bibasilar airspace opacities are perhaps slightly improved from the prior study, and may reflect pulmonary edema or pneumonia. Electronically Signed   By: Roanna Raider M.D.   On: 05/28/2015 04:44   I have personally reviewed and evaluated these images and lab results as part of my medical decision-making.   EKG Interpretation   Date/Time:  Thursday May 28 2015 03:59:49 EDT Ventricular Rate:  84 PR Interval:  170 QRS Duration: 90 QT Interval:  404 QTC Calculation: 478 R Axis:   95 Text Interpretation:  Sinus  rhythm Left atrial enlargement Borderline  right axis deviation Probable LVH with secondary repol abnrm Abnormal T,  probable ischemia, anterior leads Confirmed by Rankin Coolman  MD, Phyillis Dascoli  ([id number]) on 05/28/2015 4:35:28 AM      MDM   Final diagnoses:  Chest pain, unspecified chest pain type  Acute pulmonary edema (HCC)   Repeat EKG with T-wave inversions in V6, V5, V4 and biphasic T-wave in V3. Given concern for pulmonary edema patient initiated on IV nitroglycerin and given dose of IV Lasix. Started on BiPAP.    Patient has improved shortness of breath on BiPAP. Mild elevation in troponin. Discussed with cardiology. They do not believe patient meets STEMI criteria. Asked to have medicine admit. Discussed with Dr. Maryfrances Bunnell. Will admit to step down bed.  Loren Racer, MD 05/28/15 [phone]

## 2015-05-28 NOTE — Progress Notes (Signed)
Risk and benefit of procedure explained to the patient who display clear understanding and agree to proceed.  Discussed with patient possible procedural risk include bleeding, vascular injury, renal injury, arrythmia, MI, stroke related complications.  Signed, Tenita Cue PA Pager: [phone]  

## 2015-05-28 NOTE — Interval H&P Note (Signed)
Cath Lab Visit (complete for each Cath Lab visit)  Clinical Evaluation Leading to the Procedure:   ACS: Yes.    Non-ACS:    Anginal Classification: CCS IV  Anti-ischemic medical therapy: Minimal Therapy (1 class of medications)  Non-Invasive Test Results: No non-invasive testing performed  Prior CABG: No previous CABG      History and Physical Interval Note:  05/28/2015 12:00 PM  Cameron Hoover  has presented today for surgery, with the diagnosis of CP  The various methods of treatment have been discussed with the patient and family. After consideration of risks, benefits and other options for treatment, the patient has consented to  Procedure(s): Left Heart Cath and Coronary Angiography (N/A) as a surgical intervention .  The patient's history has been reviewed, patient examined, no change in status, stable for surgery.  I have reviewed the patient's chart and labs.  Questions were answered to the patient's satisfaction.     KELLY,THOMAS A

## 2015-05-28 NOTE — Progress Notes (Signed)
PROGRESS NOTE  Cameron Hoover:[id number] DOB: 02/22/1956 DOA: 05/28/2015 PCP: No PCP Per Patient  Brief history 59 year old male with a history of hyperlipidemia, nonobstructive CAD, and tobacco abuse presented with exertional chest discomfort on and off for the past several weeks. However the patient experienced worsening chest discomfort, shortness of breath, diaphoresis while at work on the evening of 05/27/2015. The patient initially presented to the emergency department at [facility].  Code STEMI was initially called, but his ECG demonstrated early repolarization. He was subsequently transferred to [facility]. EKG in the emergency department revealed T-wave inversion in the precordial leads, V3-V6. The patient also had elevated troponin of 0.28. There was concern for NSTEMI. The patient will start on heparin drip and nitroglycerin drip. He is currently pain-free. Cardiology was consulted to assist with management.   Assessment/Plan: Acute respiratory failure with hypoxia -Suspect pulmonary edema, but cannot rule out PNA -PCT 0.17, however patient had leukocytosis with a BBC 13.1  -Continue ceftriaxone and azithromycin  -Presently stable on 3 L nasal cannula without increased work of breathing  Elevated troponin with abnormal EKG/Angina -Concern for NSTEMI -Continue to cycle troponins  -appreciate cardiology consult -Continue heparin and nitroglycerin drip  -continue ASA -start atorvastatin 80mg  daily -Echo -check D-dimer with R-axis deviation on EKG Pulmonary Infiltrates -Repeat chest x-ray in a.m. 05/29/2015  -Hold off on additional furosemide doses on this patient develops worsening respiratory symptoms  -Continue empiric antibiotics for now  -Furosemide 60 mg IV 1 given at 4:30 AM 05/28/2015 Tobacco abuse  -Cessation discussed  -NicoDerm patch  Questionable hemoptysis -The patient recently had dental extraction 9 on 05/27/2015 -Suspect the patient likely  had some bleeding from his dental extractions   Family Communication:   Ex-wife updated at beside Disposition Plan:   Home when medically stable       Procedures/Studies: Dg Chest Port 1 View  05/28/2015  CLINICAL DATA:  Acute onset of shortness of breath. Initial encounter. EXAM: PORTABLE CHEST 1 VIEW COMPARISON:  Chest radiograph performed earlier today at 2:52 a.m. FINDINGS: The lungs are well-aerated. Persistent bibasilar airspace opacities are perhaps slightly improved from the prior study, and may reflect pulmonary edema or pneumonia. There is no evidence of pleural effusion or pneumothorax. The cardiomediastinal silhouette is within normal limits. No acute osseous abnormalities are seen. IMPRESSION: Persistent bibasilar airspace opacities are perhaps slightly improved from the prior study, and may reflect pulmonary edema or pneumonia. Electronically Signed   By: Roanna Raider M.D.   On: 05/28/2015 04:44         Subjective: Patient states that he is breathing better. No chest discomfort presently. Denies any fevers, chills, nausea, vomiting, diarrhea, and, pain. No dysuria or hematuria. No further hemoptysis presently.   Objective: Filed Vitals:   05/28/15 0530 05/28/15 0600 05/28/15 0615 05/28/15 0653  BP: 133/68  122/72 131/70  Pulse: 73  74 89  Temp:    98.3 F (36.8 C)  TempSrc:    Oral  Resp: 28  31 28   Height:  5\' 6"  (1.676 m)  5\' 6"  (1.676 m)  Weight:    73.5 kg (162 lb 0.6 oz)  SpO2: 95%  98% 100%    Intake/Output Summary (Last 24 hours) at 05/28/15 0828 Last data filed at 05/28/15 0730  Gross per 24 hour  Intake      0 ml  Output   1800 ml  Net  -1800 ml   Weight change:  Exam:   General:  Pt is alert, follows commands appropriately, not in acute distress  HEENT: No icterus, No thrush, No neck mass, [place]/AT  Cardiovascular: RRR, S1/S2, no rubs, no gallops  Respiratory: Bibasilar crackles. No wheezing. Good air movement   Abdomen: Soft/+BS, non  tender, non distended, no guarding  Extremities: No edema, No lymphangitis, No petechiae, No rashes, no synovitis  Data Reviewed: Basic Metabolic Panel:  Recent Labs Lab 05/28/15 0400  NA 139  K 4.3  CL 108  CO2 22  GLUCOSE 110*  BUN 13  CREATININE 0.88  CALCIUM 8.7*   Liver Function Tests:  Recent Labs Lab 05/28/15 0400  AST 31  ALT 26  ALKPHOS 113  BILITOT 0.5  PROT 7.1  ALBUMIN 3.1*   No results for input(s): LIPASE, AMYLASE in the last 168 hours. No results for input(s): AMMONIA in the last 168 hours. CBC:  Recent Labs Lab 05/28/15 0400  WBC 13.1*  NEUTROABS 10.6*  HGB 15.2  HCT 46.6  MCV 94.3  PLT 284   Cardiac Enzymes:  Recent Labs Lab 05/28/15 0400  TROPONINI 0.28*   BNP: Invalid input(s): POCBNP CBG: No results for input(s): GLUCAP in the last 168 hours.  No results found for this or any previous visit (from the past 240 hour(s)).   Scheduled Meds: . [START ON 05/29/2015] aspirin EC  81 mg Oral Daily  . atorvastatin  80 mg Oral q1800  . azithromycin  500 mg Oral Q24H  . cefTRIAXone (ROCEPHIN)  IV  1 g Intravenous Q24H   Continuous Infusions: . heparin 1,000 Units/hr (05/28/15 0703)  . nitroGLYCERIN 10 mcg/min (05/28/15 0431)     Cameron Chanda, DO  [facility] Pager [phone]  If 7PM-7AM, please contact night-coverage [email or web address] Password [email or web address] 05/28/2015, 8:28 AM   LOS: 0 days

## 2015-05-28 NOTE — Progress Notes (Signed)
ANTICOAGULATION CONSULT NOTE - Initial Consult  Pharmacy Consult for heparin Indication: chest pain/ACS  No Known Allergies  Patient Measurements: Height: 5\' 6"  (167.6 cm) Weight: 174 lb (78.926 kg) IBW/kg (Calculated) : 63.8  Vital Signs: Temp: 98.7 F (37.1 C) (10/27 0407) Temp Source: Oral (10/27 0407) BP: 133/68 mmHg (10/27 0530) Pulse Rate: 73 (10/27 0530)  Labs:  Recent Labs  05/28/15 0400  HGB 15.2  HCT 46.6  PLT 284  CREATININE 0.88  TROPONINI 0.28*    Estimated Creatinine Clearance: 90.3 mL/min (by C-G formula based on Cr of 0.88).   Medical History: Past Medical History  Diagnosis Date  . Coronary artery disease     Nonobstructive by LHC in 2007     Assessment: 59yo male was called as code STEMI at OSH, code canceled on arrival to [facility], troponin found elevated but DDx broad, noted hemoptysis which may be related to recent extraction of nine teeth, Hgb wnl, to begin heparin.  Goal of Therapy:  Heparin level 0.3-0.7 units/ml Monitor platelets by anticoagulation protocol: Yes   Plan:  Will begin heparin gtt at 1000 units/hr (no bolus given hemoptysis) and monitor heparin levels and CBC.  Vernard Gambles, PharmD, BCPS  05/28/2015,6:22 AM

## 2015-05-28 NOTE — Consult Note (Signed)
Interventional cardiology   Cameron Hoover was accepted from [facility] with the history "59 year old smoker acute onset chest pain and ST elevation on EKG suggesting anterior MI". Subsequent history on arrival reveal severe dyspnea, inability to lie flat, recent hemoptysis, and minimal chest discomfort. EKG revealed more repolarization abnormalities and ST elevation. Chest x-ray demonstrated bilateral airspace disease. PO2 was 55.  STEMI was canceled.  ECG with right axis deviation and prominent precordial T-wave abnormality.  Agree with echo. Consider pulmonary emboli with infarction. Consider cor pulmonale related to chronic lung disease. Likely has coronary disease and should have coronary angiography once other clinical features are better controlled. IV heparin should be given.

## 2015-05-28 NOTE — H&P (Signed)
History and Physical  Patient Name: Cameron Hoover     JIR:[id number]    DOB: 04/11/1956    DOA: 05/28/2015 Referring physician: Loren Racer, MD PCP: No PCP Per Patient      Chief Complaint: Chest pain  HPI: Cameron Hoover is a 59 y.o. male with a past medical history significant for hyperlipidemia, smoking and non-obstructive CAD by [facility] in 2007 who presents with acute chest pain.  History is collected from the patient and also from his ex-wife who is present at the bedside. The patient has been working long hours at a stressful and physically active new job without a break for the last 2 weeks. She still notes that he has been complaining of chest discomfort off and on for the last several days, and then tonight during a break at work the patient felt acute chest discomfort, dyspnea, and diaphoresis. EMS was called and the patient initially had anterolateral ST elevations and so he was transferred to [facility] for STEMI.    On arrival, the patient's ECG showed T-wave inversions in V3 through V6, his previous ECG was reinterpreted as early repolarization, and the patient had no continuing chest pain.  A repeat troponin was elevated. The patient was also hypoxic, with a chest x-ray showing bilateral infiltrates read as pulmonary edema versus pneumonia, leukocytosis and requiring BiPAP.    Of note, the patient had a bronchitis 3 weeks ago that his wife have been resolving. He also had 9 teeth removed on the right side one day ago and reportedly coughed up some blood at work.   Review of Systems:  Patient seen 6:21 AM on 05/28/2015. Pt complains of shortness of breath, shoulder pain, back pain. Pt denies any continuing chest pain, fever, recent sputum production.  All other systems negative except as just noted or noted in the history of present illness.  No Known Allergies  Medications: None   Past medical history:  1. Non-obstructive CAD, with positive stress test in 2007 2. Smoking  3.  Hyperlipidemia   Past surgical history:  1. LHC 2007 2. Facial surgery after saw accident 2002   Family history: There is a family history of death from myocardial infarction and 2 brothers. There is a cousin who had an myocardial infarction in his 85s. Father had emphysema and lung cancer. Mother had diabetes and hypertension. Sister has diabetes.  Social History: Patient lives alone. His ex-wife lives next door and is a support person. He recently got attempt a permanent job. He is an active smoker. He lives in [place].      Physical Exam: BP 133/68 mmHg  Pulse 73  Temp(Src) 98.7 F (37.1 C) (Oral)  Resp 28  SpO2 95% General appearance: Well-developed, adult male, alert and in moderate distress from dyspnea on Bipap.   Eyes: Anicteric, conjunctiva pink, lids and lashes normal.     ENT: No nasal deformity, discharge, or epistaxis.  OP moist without lesions.  There are healing right sided tooth sockets, without bleeding. Skin: Warm and dry.   Cardiac: RRR, nl S1-S2, no murmurs appreciated.  Capillary refill is brisk.  No JVD.  No LE edema.  Radial and DP pulses 2+ and symmetric. Respiratory: On BiPAP, dependent crackles bilaterally. Abdomen: Abdomen soft without rigidity.  No TTP. No ascites, distension.   MSK: No deformities or effusions. Neuro: Sensorium intact and responding to questions, attention normal.  Speech is fluent.  Moves all extremities equally and with normal coordination.    Psych: Behavior appropriate.  Affect normal.  No evidence of aural or visual hallucinations or delusions.       Labs on Admission:  The metabolic panel shows normal sodium, potassium, bicarbonate, and renal function. The complete blood count shows leukocytosis to 13.1 K per UL. Normal hemoglobin and platelets. Transaminases and bilirubin are normal. The BNP is pending. Lactic acid is normal. The initial troponin is 0.88 ng per mL.  Radiological Exams on Admission: Personally  reviewed: Dg Chest Port 1 View 05/28/2015 Bilateral infiltrates, favor pulmonary edema.   EKG: Independently reviewed. TWI in V3-V6.    Assessment/Plan  1. NSTEMI: The patient has chest discomfort with elevated troponin and T-wave changes. He is hypoxic and requiring Lasix and BiPAP.  -Restart heparin -Nitroglycerin gtt. -Serial troponins are ordered -Telemetry -Echocardiogram is ordered -Consult to cardiology, appreciate recommendations -Atorvastatin 80 mg daily -Aspirin -Strict ins and outs -Continue BiPAP as needed -Repeat furosemide if needed -Smoking cessation counseling ordered  2. Possible community acquired pneumonia:  This is new.  We will cover empirically. -Ceftriaxone and azithromycin -Follow blood cultures -Procalcitonin is ordered     DVT PPx: Heparin per pharmacy for ACS Diet: NPO Consultants: Cardiology Code Status: Full Family Communication: The patient's diagnosis, workup, and treatment plan were discussed with his wife at the bedside. All questions were answered.  Disposition Plan:  Admit to stepdown for NSTEMI and empiric treatment of pneumonia. Heparin and nitroglycerin drip. BiPAP as needed.         Alberteen Sam [facility] Pager [phone]

## 2015-05-28 NOTE — Progress Notes (Signed)
  Echocardiogram 2D Echocardiogram has been performed.  Cameron Hoover 05/28/2015, 11:27 AM

## 2015-05-28 NOTE — ED Notes (Signed)
Attempted report, was advised charge nurse has not approved the bed assignment and they will call back.

## 2015-05-28 NOTE — Progress Notes (Signed)
TR BAND REMOVAL  LOCATION:    right radial  DEFLATED PER PROTOCOL:    Yes.    TIME BAND OFF / DRESSING APPLIED:    1915   SITE UPON ARRIVAL:    Level 0  SITE AFTER BAND REMOVAL:    Level 0  REVERSE ALLEN'S TEST:     negative  CIRCULATION SENSATION AND MOVEMENT:    Within Normal Limits   Yes.    COMMENTS:   Post tr band instructions given. Pt verbalized understanding.

## 2015-05-28 NOTE — H&P (View-Only) (Signed)
Risk and benefit of procedure explained to the patient who display clear understanding and agree to proceed.  Discussed with patient possible procedural risk include bleeding, vascular injury, renal injury, arrythmia, MI, stroke related complications.  Ramond Dial PA Pager: [phone]

## 2015-05-28 NOTE — Consult Note (Signed)
Reason for Consult: code STEMI called from [facility], actually shortness of breath and hemoptysis Referring Physician: code STEMI [facility] Primary Cardiologist: Dr. [place] 2007 Cameron Hoover is an 59 y.o. male.  HPI: Cameron Hoover is a 59  yo man with PMH of 1ppd tobacco use x35 years, GERD, prior nonobstructive CAD on caths '95, '07, unemployed for several years until 6 months ago when he started working lifting boxes and lost 25 lbs. He works the [employer] shift and became short of breath at break. He frequently gets short of breath lifting boxes all night (6pm-4am)5-6 days per week. He has some chest discomfort with coughing. He notices spitting up some red tinged phlegm. He presented to [facility] in [place] and a code STEMI was called however his ECG demonstrated early repolarization and on arrival to [facility] he had SOB but no continuing chest pain. ECG with sinus rhythm and ST depression in precordial leads but no ST elevation so code stemi cancelled and further medical evaluation decided initially.   [place] with Chest x-ray report with diffuse bilateral airspace disease, sparing lung apices with suggestion of vascular congestion and small right pleural effusion.   Of note, he had 9 teeth pulled per the primary RN yesterday.   He received heparin bolus 4000 units and asa 324 mg.   He denied fever/chills/nausea/voming/diarrhea.   History reviewed. No pertinent past medical history.  History reviewed. No pertinent past surgical history.  Family history: father/brother with previous MIs, father with lung cancer, mother with T2DM/hypertension lived to 63+  Social History: 1ppd x35+ years, lives alone, lives near ex-wife, works in [employer] Allergies: Allergies no known allergies  Medications: I have reviewed the patient's current medications. Prior to Admission:  (Not in a hospital admission) Scheduled: Continuous:  No results found for this or any previous visit (from the past 48  hour(s)).  No results found.  Review of Systems  Constitutional: Positive for weight loss and malaise/fatigue. Negative for fever and chills.  HENT: Negative for ear discharge and ear pain.   Eyes: Negative for double vision and photophobia.  Respiratory: Positive for cough, hemoptysis, sputum production, shortness of breath and wheezing.   Cardiovascular: Positive for chest pain. Negative for palpitations, orthopnea, claudication and leg swelling.  Gastrointestinal: Positive for heartburn. Negative for abdominal pain, diarrhea and constipation.  Genitourinary: Negative for dysuria, urgency and hematuria.  Musculoskeletal: Negative for myalgias, back pain and neck pain.  Skin: Negative for rash.  Neurological: Negative for dizziness, tingling, tremors and headaches.  Endo/Heme/Allergies: Negative for polydipsia. Does not bruise/bleed easily.  Psychiatric/Behavioral: Negative for suicidal ideas, hallucinations and substance abuse.   Blood pressure 157/72, pulse 90, temperature 98.7 F (37.1 C), temperature source Oral, resp. rate 29, SpO2 80 %. Physical Exam  Nursing note and vitals reviewed. Constitutional: He is oriented to person, place, and time. He appears well-developed and well-nourished. No distress.  HENT:  Head: Normocephalic and atraumatic.  Nose: Nose normal.  Mouth/Throat: Oropharynx is clear and moist. No oropharyngeal exudate.  Eyes: Conjunctivae and EOM are normal. Pupils are equal, round, and reactive to light. No scleral icterus.  Neck: Normal range of motion. Neck supple. JVD present. No tracheal deviation present.  JVD midneck with slight HJR  Cardiovascular: Normal rate, regular rhythm and intact distal pulses.   No murmur heard. Respiratory: He is in respiratory distress. He has wheezes. He has rales.  GI: Soft. Bowel sounds are normal. He exhibits no distension. There is no tenderness. There is no rebound.  Musculoskeletal: Normal range of motion. He exhibits  no edema or tenderness.  Neurological: He is alert and oriented to person, place, and time. No cranial nerve deficit. Coordination normal.  Skin: Skin is warm and dry. No rash noted. He is not diaphoretic. No erythema.  Psychiatric: He has a normal mood and affect. His behavior is normal. Thought content normal.   Wbc 12.6, h/h 15. 7/48.1, plt 315, troponin negative at OSH; 0.28 here  Assessment/Plan: Cameron Hoover is a 59  yo man with PMH of 1ppd tobacco use x35 years, GERD, prior nonobstructive CAD on caths '95, '07, unemployed for several years until 6 months ago when he started working lifting boxes and lost 25 lbs who presents with worsening shortness of breath. He also mentioned some chest discomfort with coughing. Differential diagnosis is broad including pneumonia, pulmonary edema/flash pulmonary edema related to hypertension, pulmonary alveolar hemorrhage. He has just arrived to the ER so multiple labs and diagnostic studies including repeat ECGs, chest x-ray and ABG are pending. He currently has no chest pain. ECG is concerning for underlying CAD. He is improving with BIPAP, lasix and NTG. He currently isn't able to lie flat. For now would favor evaluating his comorbidities, recent teeth pull? And other potential infectious symptoms before likely need to pursue cardiac catheterization. Would keep NPO for now.   Problem List Shortness of Breath Hypertension ? Pulmonary edema based on written report Leukocytosis ECG with ST depression in precordium Tobacco abuse Plan: 1. Obtain chest x-ray, ABG, blood cultures x2, start antibiotics for CAP 2. Start NTG gtt 3. Already received aspirin 4. Reassess after more diagnostic information - evaluate for infectious symptoms 5. Tobacco and alcohol counseling 6. Trend cardiac biomarkers, telemetry, admit to medicine 7. If no signs of bleeding, would continue/restart heparin gtt given elevated troponin 8. Atorvastatin 80 mg qHS first dose now, daily  asa 81 mg 9. Echocardiogram this AM 10. Likely coronary angiogram once can lie closer to flat to tolerate procedure Eliab Closson 05/28/2015, 4:21 AM

## 2015-05-29 ENCOUNTER — Other Ambulatory Visit: Payer: Self-pay

## 2015-05-29 ENCOUNTER — Encounter (HOSPITAL_COMMUNITY): Payer: Self-pay | Admitting: Cardiovascular Disease

## 2015-05-29 ENCOUNTER — Inpatient Hospital Stay (HOSPITAL_COMMUNITY): Payer: BLUE CROSS/BLUE SHIELD

## 2015-05-29 DIAGNOSIS — Z955 Presence of coronary angioplasty implant and graft: Secondary | ICD-10-CM | POA: Diagnosis present

## 2015-05-29 DIAGNOSIS — I10 Essential (primary) hypertension: Secondary | ICD-10-CM

## 2015-05-29 LAB — BASIC METABOLIC PANEL
ANION GAP: 11 (ref 5–15)
BUN: 11 mg/dL (ref 6–20)
CHLORIDE: 102 mmol/L (ref 101–111)
CO2: 22 mmol/L (ref 22–32)
Calcium: 9.1 mg/dL (ref 8.9–10.3)
Creatinine, Ser: 0.9 mg/dL (ref 0.61–1.24)
Glucose, Bld: 120 mg/dL — ABNORMAL HIGH (ref 65–99)
POTASSIUM: 3.2 mmol/L — AB (ref 3.5–5.1)
SODIUM: 135 mmol/L (ref 135–145)

## 2015-05-29 LAB — CBC
HEMATOCRIT: 40.6 % (ref 39.0–52.0)
Hemoglobin: 13.8 g/dL (ref 13.0–17.0)
MCH: 31.1 pg (ref 26.0–34.0)
MCHC: 34 g/dL (ref 30.0–36.0)
MCV: 91.4 fL (ref 78.0–100.0)
Platelets: 249 10*3/uL (ref 150–400)
RBC: 4.44 MIL/uL (ref 4.22–5.81)
RDW: 13.6 % (ref 11.5–15.5)
WBC: 9.6 10*3/uL (ref 4.0–10.5)

## 2015-05-29 MED ORDER — FUROSEMIDE 10 MG/ML IJ SOLN
20.0000 mg | Freq: Two times a day (BID) | INTRAMUSCULAR | Status: DC
  Administered 2015-05-29 – 2015-05-30 (×2): 20 mg via INTRAVENOUS
  Filled 2015-05-29 (×2): qty 2

## 2015-05-29 MED ORDER — POTASSIUM CHLORIDE CRYS ER 20 MEQ PO TBCR
40.0000 meq | EXTENDED_RELEASE_TABLET | Freq: Every day | ORAL | Status: DC
  Administered 2015-05-30: 10:00:00 40 meq via ORAL
  Filled 2015-05-29: qty 2

## 2015-05-29 MED ORDER — IOHEXOL 350 MG/ML SOLN
100.0000 mL | Freq: Once | INTRAVENOUS | Status: AC | PRN
  Administered 2015-05-29: 100 mL via INTRAVENOUS

## 2015-05-29 MED ORDER — POTASSIUM CHLORIDE CRYS ER 20 MEQ PO TBCR
40.0000 meq | EXTENDED_RELEASE_TABLET | Freq: Two times a day (BID) | ORAL | Status: AC
  Administered 2015-05-29 (×2): 40 meq via ORAL
  Filled 2015-05-29 (×2): qty 2

## 2015-05-29 MED ORDER — POTASSIUM CHLORIDE CRYS ER 20 MEQ PO TBCR: 40.0000 meq | EXTENDED_RELEASE_TABLET | Freq: Every day | ORAL | Status: DC

## 2015-05-29 NOTE — Progress Notes (Signed)
CARDIAC REHAB PHASE I   PRE:  Rate/Rhythm: 68 SR    BP: sitting 139/53    SaO2:   MODE:  Ambulation: 460 ft   POST:  Rate/Rhythm: 71 SR    BP: sitting 148/85     SaO2:   Pt sts he feels a mild feeling in his chest, like something was there. Seemed to be better with walking. He would not rate this feeling on the pain number scale. Tolerated walk well without c/o. Ed completed with good reception (teach back). Will send referral to [place] [facility]. Understands importance of Brilinta/ASA and will get card.  [phone]   Elissa Lovett [place] CES, ACSM 05/29/2015 9:21 AM

## 2015-05-29 NOTE — Progress Notes (Signed)
PROGRESS NOTE  PANFILO KETCHUM ZOX:[id number] DOB: 12-21-1955 DOA: 05/28/2015 PCP: No PCP Per Patient  Brief history 59 year old male with a history of hyperlipidemia, nonobstructive CAD, and tobacco abuse presented with exertional chest discomfort on and off for the past several weeks. However the patient experienced worsening chest discomfort, shortness of breath, diaphoresis while at work on the evening of 05/27/2015. The patient initially presented to the emergency department at [facility]. Code STEMI was initially called, but his ECG demonstrated early repolarization. He was subsequently transferred to [facility]. EKG in the emergency department revealed T-wave inversion in the precordial leads, V3-V6. The patient also had elevated troponin of 0.28. There was concern for NSTEMI. The patient will start on heparin drip and nitroglycerin drip. He is currently pain-free. Cardiology was consulted to assist with management.  Assessment/Plan: Acute respiratory failure with hypoxia -Suspect pulmonary edema, but cannot rule out PNA -PCT 0.17, however patient had leukocytosis with a BBC 13.1  -Continue ceftriaxone and azithromycin  -Presently stable on 3 L nasal cannula without increased work of breathing  NSTEMI -Continue to cycle troponins--peaked at 2.78 -appreciate cardiology consult -05/28/2015 cardiac catheterization--80% mid LAD status post DES -continue ASA and Brillinta -Continue nitroglycerin per cardiology -start atorvastatin  daily -Echo--EF 40-45 percent, grade 1 DD, inferolateral, anterolateral, apical hypokinesis Elevated d-dimer  -the patient continues to have some vague chest discomfort despite cardiac intervention -PERC score 2 -CTA chest Pulmonary Infiltrates/Pulmonary edema -Repeat chest x-ray in a.m. 05/29/2015  -Hold off on additional furosemide doses on this patient develops worsening respiratory symptoms  -Continue empiric antibiotics for now   -Furosemide 60 mg IV 1 given at 4:30 AM 05/28/2015 -furosemide continued Tobacco abuse  -Cessation discussed  -NicoDerm patch  Questionable hemoptysis -The patient recently had dental extraction 9 on 05/27/2015 -Suspect the patient likely had some bleeding from his dental extractions   Family Communication: Ex-wife updated at beside Disposition Plan: Home when medically stable    Procedures/Studies: Dg Chest Port 1 View  05/28/2015  CLINICAL DATA:  Acute onset of shortness of breath. Initial encounter. EXAM: PORTABLE CHEST 1 VIEW COMPARISON:  Chest radiograph performed earlier today at 2:52 a.m. FINDINGS: The lungs are well-aerated. Persistent bibasilar airspace opacities are perhaps slightly improved from the prior study, and may reflect pulmonary edema or pneumonia. There is no evidence of pleural effusion or pneumothorax. The cardiomediastinal silhouette is within normal limits. No acute osseous abnormalities are seen. IMPRESSION: Persistent bibasilar airspace opacities are perhaps slightly improved from the prior study, and may reflect pulmonary edema or pneumonia. Electronically Signed   By: Roanna Raider M.D.   On: 05/28/2015 04:44         Subjective:  patient continues to complain of vague substernal chest discomfort. Denies any fevers, chills. Denies any further hemoptysis. Breathing more comfortably than yesterday. Denies any nausea, vomiting, diarrhea, abdominal pain. No hematemesis. No hematuria, hematochezia, melena.  Objective: Filed Vitals:   05/28/15 1926 05/28/15 2000 05/29/15 0437 05/29/15 0900  BP: 137/60 129/59 129/61 139/53  Pulse: 73  55 63  Temp: 97.4 F (36.3 C)  98.4 F (36.9 C) 98 F (36.7 C)  TempSrc: Oral  Oral Oral  Resp: Height:      Weight:   76 kg (167 lb 8.8 oz)   SpO2: 99% 99% 94% 95%    Intake/Output Summary (Last 24 hours) at 05/29/15 1332 Last data filed at 05/29/15 8119  Gross per 24  hour  Intake 666.45 ml   Output   2020 ml  Net -1353.55 ml   Weight change: 2.5 kg (5 lb 8.2 oz) Exam:   General:  Pt is alert, follows commands appropriately, not in acute distress  HEENT: No icterus, No thrush, No neck mass, [place]/AT  Cardiovascular: RRR, S1/S2, no rubs, no gallops+ JVD   Respiratory: bibasilar crackles, right greater than left--no wheeze   Abdomen: Soft/+BS, non tender, non distended, no guarding  Extremities: No edema, No lymphangitis, No petechiae, No rashes, no synovitis  Data Reviewed: Basic Metabolic Panel:  Recent Labs Lab 05/28/15 0400 05/29/15 0605  NA 139 135  K 4.3 3.2*  CL 108 102  CO2 22 22  GLUCOSE 110* 120*  BUN 13 11  CREATININE 0.88 0.90  CALCIUM 8.7* 9.1   Liver Function Tests:  Recent Labs Lab 05/28/15 0400  AST 31  ALT 26  ALKPHOS 113  BILITOT 0.5  PROT 7.1  ALBUMIN 3.1*   No results for input(s): LIPASE, AMYLASE in the last 168 hours. No results for input(s): AMMONIA in the last 168 hours. CBC:  Recent Labs Lab 05/28/15 0400 05/29/15 0605  WBC 13.1* 9.6  NEUTROABS 10.6*  --   HGB 15.2 13.8  HCT 46.6 40.6  MCV 94.3 91.4  PLT 284 249   Cardiac Enzymes:  Recent Labs Lab 05/28/15 0400 05/28/15 0718 05/28/15 1355 05/28/15 1946  TROPONINI 0.28* 1.37* 2.78* 1.96*   BNP: Invalid input(s): POCBNP CBG: No results for input(s): GLUCAP in the last 168 hours.  Recent Results (from the past 240 hour(s))  Culture, blood (routine x 2)     Status: None (Preliminary result)   Collection Time: 05/28/15  4:58 AM  Result Value Ref Range Status   Specimen Description BLOOD FOREARM RIGHT  Final   Special Requests BOTTLES DRAWN AEROBIC ONLY 10CC  Final   Culture NO GROWTH 1 DAY  Final   Report Status PENDING  Incomplete  Culture, blood (routine x 2)     Status: None (Preliminary result)   Collection Time: 05/28/15  5:03 AM  Result Value Ref Range Status   Specimen Description BLOOD HAND RIGHT  Final   Special Requests BOTTLES DRAWN  AEROBIC ONLY 10CC  Final   Culture NO GROWTH 1 DAY  Final   Report Status PENDING  Incomplete  MRSA PCR Screening     Status: None   Collection Time: 05/28/15  6:58 AM  Result Value Ref Range Status   MRSA by PCR NEGATIVE NEGATIVE Final    Comment:        The GeneXpert MRSA Assay (FDA approved for NASAL specimens only), is one component of a comprehensive MRSA colonization surveillance program. It is not intended to diagnose MRSA infection nor to guide or monitor treatment for MRSA infections.      Scheduled Meds: . aspirin EC  81 mg Oral Daily  . atorvastatin  80 mg Oral q1800  . azithromycin  500 mg Oral Q24H  . cefTRIAXone (ROCEPHIN)  IV  1 g Intravenous Q24H  . furosemide  20 mg Intravenous BID  . Influenza vac split quadrivalent PF  0.5 mL Intramuscular Tomorrow-1000  . pneumococcal 23 valent vaccine  0.5 mL Intramuscular Tomorrow-1000  . potassium chloride  40 mEq Oral BID  . [START ON 05/30/2015] potassium chloride  40 mEq Oral Daily  . sodium chloride  3 mL Intravenous Q12H  . ticagrelor  90 mg Oral BID   Continuous Infusions: . nitroGLYCERIN Stopped (05/29/15  4540)     Gerron Guidotti, DO  [facility] Pager [phone]  If 7PM-7AM, please contact night-coverage [email or web address] Password [email or web address] 05/29/2015, 1:32 PM   LOS: 1 day

## 2015-05-29 NOTE — Progress Notes (Signed)
CM provided pt with Brilinta booket with 30 day freecard and copay card enclosed. CM explained card usage with pt verbally stating understanding of how to use both cards.  [facility] called by CM and confirmed medication is in stock and pt made aware. No other needs identified @ present time.  Gae Gallop RN,BSN,CM [phone]

## 2015-05-29 NOTE — Progress Notes (Signed)
Patient Name: Cameron Hoover Date of Encounter: 05/29/2015  Primary Cardiologist: Dr. Delton See (previously seen by Dr. [place] in 2007)   Principal Problem:   NSTEMI (non-ST elevated myocardial infarction) [facility]) Active Problems:   Hyperlipidemia   Acute respiratory failure with hypoxia (HCC)   Tobacco abuse   Leukocytosis   Angina pectoris (HCC)   Acute pulmonary edema (HCC)    SUBJECTIVE  Still has intermittent burning sensation across the chest overnight, continue to cough, but no true hemoptysis, some brown tinged sputum only.   CURRENT MEDS . aspirin EC  81 mg Oral Daily  . aspirin EC  81 mg Oral Daily  . atorvastatin  80 mg Oral q1800  . azithromycin  500 mg Oral Q24H  . cefTRIAXone (ROCEPHIN)  IV  1 g Intravenous Q24H  . Influenza vac split quadrivalent PF  0.5 mL Intramuscular Tomorrow-1000  . pneumococcal 23 valent vaccine  0.5 mL Intramuscular Tomorrow-1000  . sodium chloride  3 mL Intravenous Q12H  . ticagrelor  90 mg Oral BID    OBJECTIVE  Filed Vitals:   05/28/15 1811 05/28/15 1926 05/28/15 2000 05/29/15 0437  BP: 159/61 137/60 129/59 129/61  Pulse: 61 73  55  Temp: 99 F (37.2 C) 97.4 F (36.3 C)  98.4 F (36.9 C)  TempSrc: Oral Oral  Oral  Resp: 19 19 19 22   Height:      Weight:    167 lb 8.8 oz (76 kg)  SpO2: 99% 99% 99% 94%    Intake/Output Summary (Last 24 hours) at 05/29/15 0754 Last data filed at 05/29/15 6226  Gross per 24 hour  Intake 725.95 ml  Output   2520 ml  Net -1794.05 ml   Filed Weights   05/28/15 0653 05/29/15 0437  Weight: 162 lb 0.6 oz (73.5 kg) 167 lb 8.8 oz (76 kg)    PHYSICAL EXAM  General: Pleasant, NAD. Neuro: Alert and oriented X 3. Moves all extremities spontaneously. Psych: Normal affect. HEENT:  Normal  Neck: Supple without bruits or JVD. Lungs:  Resp regular and unlabored, CTA. Heart: RRR no s3, s4. R radial cath site stable with 2+ distal pulse. 3/6 systolic murmur at apex Abdomen: Soft, non-tender,  non-distended, BS + x 4.  Extremities: No clubbing, cyanosis or edema. DP/PT/Radials 2+ and equal bilaterally.  Accessory Clinical Findings  CBC  Recent Labs  05/28/15 0400 05/29/15 0605  WBC 13.1* 9.6  NEUTROABS 10.6*  --   HGB 15.2 13.8  HCT 46.6 40.6  MCV 94.3 91.4  PLT 284 249   Basic Metabolic Panel  Recent Labs  05/28/15 0400 05/29/15 0605  NA 139 135  K 4.3 3.2*  CL 108 102  CO2 22 22  GLUCOSE 110* 120*  BUN 13 11  CREATININE 0.88 0.90  CALCIUM 8.7* 9.1   Liver Function Tests  Recent Labs  05/28/15 0400  AST 31  ALT 26  ALKPHOS 113  BILITOT 0.5  PROT 7.1  ALBUMIN 3.1*   Cardiac Enzymes  Recent Labs  05/28/15 0718 05/28/15 1355 05/28/15 1946  TROPONINI 1.37* 2.78* 1.96*   BNP Invalid input(s): POCBNP D-Dimer  Recent Labs  05/28/15 0852  DDIMER 0.82*   TELE NSR with HR 40-60s    ECG  NSR with deep TWI in precordial leads  Echocardiogram 05/28/2015  LV EF: 40% -  45%  ------------------------------------------------------------------- Indications:   Chest pain 786.51.  ------------------------------------------------------------------- History:  PMH: Acute respiratory failure with hypoxia. Acute myocardial infarction. Risk factors: Current tobacco  use. Dyslipidemia.  ------------------------------------------------------------------- Study Conclusions  - Left ventricle: The cavity size was normal. Wall thickness was increased in a pattern of mild LVH. Systolic function was mildly to moderately reduced. The estimated ejection fraction was in the range of 40% to 45%. Inferolateral, anterolateral, and apical hypokinesis. Doppler parameters are consistent with abnormal left ventricular relaxation (grade 1 diastolic dysfunction). - Aortic valve: There was no stenosis. - Mitral valve: Mildly calcified annulus. There was no significant regurgitation. - Right ventricle: The cavity size was normal. Systolic  function was normal. - Pulmonary arteries: No complete TR doppler jet so unable to estimate PA systolic pressure. - Inferior vena cava: The vessel was normal in size. The respirophasic diameter changes were in the normal range (>= 50%), consistent with normal central venous pressure.  Impressions:  - Normal LV size with mild LV hypertrophy. EF 40-45% with wall motion abnormalities as noted above. Normal RV size and systolic function. No significant valvular abnormalities.     Radiology/Studies  Dg Chest Port 1 View  05/28/2015  CLINICAL DATA:  Acute onset of shortness of breath. Initial encounter. EXAM: PORTABLE CHEST 1 VIEW COMPARISON:  Chest radiograph performed earlier today at 2:52 a.m. FINDINGS: The lungs are well-aerated. Persistent bibasilar airspace opacities are perhaps slightly improved from the prior study, and may reflect pulmonary edema or pneumonia. There is no evidence of pleural effusion or pneumothorax. The cardiomediastinal silhouette is within normal limits. No acute osseous abnormalities are seen. IMPRESSION: Persistent bibasilar airspace opacities are perhaps slightly improved from the prior study, and may reflect pulmonary edema or pneumonia. Electronically Signed   By: Roanna Raider M.D.   On: 05/28/2015 04:44    ASSESSMENT AND PLAN  Pt with PMH of tobacco abuse, nonobstructive CAD on cath ('95 and '07) who were last seen by Dr. [place] in 2007 presented with severe dyspnea, inability to lie flat, recent hemoptysis and minimal chest discomfort. Initially transferred as anterior STEMI, STEMI cancelled on further review.  1. NSTEMI  - initially transferred as anterior STEMI, code STEMI cancelled given lack of true ST elevation.   - cath 05/28/2015 80% mid LAD treated with 3.026 mm Resolute DES stent postdilated to 3.26 mm, 25% LM lesion, 10% prox RCA  - continue ASA and Brilinta, no BB due to bradycardia. R radial cath site stable  - echo 05/28/2015 EF  40-45%, grade 1 diastolic dysfunction, no significant valvular abnormalities.  2. Positive d-dimer: 0.8. However relatively lower suspicion given bradycardia in 40-60s overnight. Per IM.   3. Productive cough: on rocephin  4. ?hemoptysis: likely bleeding source was from teeth pulled on Tue 10/25 instead of intrapulm source  5. Pronounced 3/6 systolic murmur at apex location: however no obvious valvular abnormality need on Echo, unclear source  6. HTN  Signed, Azalee Course PA-C Pager: [phone]   The patient was seen, examined and discussed with Azalee Course, PA-C and I agree with the above.   59 year old male admitted as STEMI that was cancelled. His Troponin was elevated 0.28 --> 2.8 --> 1.9, off iv Heparin, underwent left cardiac cath yesterday that showed 80% mid LAD treated with 3.026 mm Resolute DES stent postdilated to 3.26 mm, 25% LM lesion, 10% prox RCA. We will continue ASA and Brilinta, no BB due to bradycardia. R radial cath site stable. Echo 05/28/2015 EF 40-45%, grade 1 diastolic dysfunction, no significant valvular abnormalities. We will replace potassium. He is being treated for pneumonia with iv Ceftriaxone. Significant crackles at the basis, I will give  him low dose lasix.   Lars Masson 05/29/2015

## 2015-05-30 DIAGNOSIS — I5021 Acute systolic (congestive) heart failure: Secondary | ICD-10-CM | POA: Diagnosis present

## 2015-05-30 DIAGNOSIS — Z955 Presence of coronary angioplasty implant and graft: Secondary | ICD-10-CM

## 2015-05-30 LAB — PROCALCITONIN

## 2015-05-30 LAB — BASIC METABOLIC PANEL
Anion gap: 9 (ref 5–15)
BUN: 13 mg/dL (ref 6–20)
CALCIUM: 9.3 mg/dL (ref 8.9–10.3)
CHLORIDE: 98 mmol/L — AB (ref 101–111)
CO2: 22 mmol/L (ref 22–32)
CREATININE: 0.87 mg/dL (ref 0.61–1.24)
Glucose, Bld: 107 mg/dL — ABNORMAL HIGH (ref 65–99)
POTASSIUM: 4.1 mmol/L (ref 3.5–5.1)
SODIUM: 129 mmol/L — AB (ref 135–145)

## 2015-05-30 LAB — CBC
HEMATOCRIT: 45.4 % (ref 39.0–52.0)
HEMOGLOBIN: 15.5 g/dL (ref 13.0–17.0)
MCH: 30.9 pg (ref 26.0–34.0)
MCHC: 34.1 g/dL (ref 30.0–36.0)
MCV: 90.6 fL (ref 78.0–100.0)
Platelets: 291 10*3/uL (ref 150–400)
RBC: 5.01 MIL/uL (ref 4.22–5.81)
RDW: 13.4 % (ref 11.5–15.5)
WBC: 10.3 10*3/uL (ref 4.0–10.5)

## 2015-05-30 MED ORDER — TICAGRELOR 90 MG PO TABS
90.0000 mg | ORAL_TABLET | Freq: Two times a day (BID) | ORAL | Status: DC
Start: 2015-05-30 — End: 2015-07-07

## 2015-05-30 MED ORDER — AZITHROMYCIN 500 MG PO TABS: 500.0000 mg | ORAL_TABLET | ORAL | Status: DC

## 2015-05-30 MED ORDER — FUROSEMIDE 20 MG PO TABS: 20.0000 mg | ORAL_TABLET | Freq: Every day | ORAL | Status: DC

## 2015-05-30 MED ORDER — FUROSEMIDE 20 MG PO TABS
20.0000 mg | ORAL_TABLET | Freq: Every day | ORAL | Status: DC
  Filled 2015-05-30: qty 1

## 2015-05-30 MED ORDER — ATORVASTATIN CALCIUM 80 MG PO TABS: 80.0000 mg | ORAL_TABLET | Freq: Every day | ORAL | Status: DC

## 2015-05-30 MED ORDER — ASPIRIN 81 MG PO TBEC: 81.0000 mg | DELAYED_RELEASE_TABLET | Freq: Every day | ORAL | Status: DC

## 2015-05-30 NOTE — Progress Notes (Signed)
Patient Name: Cameron Hoover Date of Encounter: 05/30/2015  Primary Cardiologist: Dr. Delton See (previously seen by Dr. [place] in 2007)   Principal Problem:   NSTEMI (non-ST elevated myocardial infarction) [facility]) Active Problems:   Hyperlipidemia   Acute respiratory failure with hypoxia (HCC)   Tobacco abuse   Leukocytosis   Angina pectoris (HCC)   Acute pulmonary edema (HCC)   Stented coronary artery    SUBJECTIVE     CURRENT MEDS . aspirin EC  81 mg Oral Daily  . atorvastatin  80 mg Oral q1800  . azithromycin  500 mg Oral Q24H  . cefTRIAXone (ROCEPHIN)  IV  1 g Intravenous Q24H  . furosemide  20 mg Intravenous BID  . potassium chloride  40 mEq Oral Daily  . sodium chloride  3 mL Intravenous Q12H  . ticagrelor  90 mg Oral BID    OBJECTIVE  Filed Vitals:   05/29/15 1900 05/29/15 1936 05/30/15 0423 05/30/15 0726  BP:  150/71 161/77 120/77  Pulse:  68 63 75  Temp:  97.8 F (36.6 C) 97 F (36.1 C)   TempSrc:  Oral Oral Oral  Resp: Height:      Weight:   164 lb 0.4 oz (74.4 kg)   SpO2: 96% 99% 98% 99%    Intake/Output Summary (Last 24 hours) at 05/30/15 0829 Last data filed at 05/30/15 0727  Gross per 24 hour  Intake  810.5 ml  Output   2925 ml  Net -2114.5 ml   Filed Weights   05/28/15 0653 05/29/15 0437 05/30/15 0423  Weight: 162 lb 0.6 oz (73.5 kg) 167 lb 8.8 oz (76 kg) 164 lb 0.4 oz (74.4 kg)    PHYSICAL EXAM  General: Pleasant, NAD. Neuro: Alert and oriented X 3. Moves all extremities spontaneously. Psych: Normal affect. HEENT:  Normal  Neck: Supple without bruits or JVD. Lungs:  Resp regular and unlabored, CTA. Heart: RRR no s3, s4. R radial cath site stable with 2+ distal pulse. 3/6 systolic murmur at apex Abdomen: Soft, non-tender, non-distended, BS + x 4.  Extremities: No clubbing, cyanosis or edema. DP/PT/Radials 2+ and equal bilaterally.  Accessory Clinical Findings  CBC  Recent Labs  05/28/15 0400 05/29/15 0605  05/30/15 0439  WBC 13.1* 9.6 10.3  NEUTROABS 10.6*  --   --   HGB 15.2 13.8 15.5  HCT 46.6 40.6 45.4  MCV 94.3 91.4 90.6  PLT 284 249 291   Basic Metabolic Panel  Recent Labs  05/29/15 0605 05/30/15 0439  NA 135 129*  K 3.2* 4.1  CL 102 98*  CO2 22 22  GLUCOSE 120* 107*  BUN 11 13  CREATININE 0.90 0.87  CALCIUM 9.1 9.3   Liver Function Tests  Recent Labs  05/28/15 0400  AST 31  ALT 26  ALKPHOS 113  BILITOT 0.5  PROT 7.1  ALBUMIN 3.1*   Cardiac Enzymes  Recent Labs  05/28/15 0718 05/28/15 1355 05/28/15 1946  TROPONINI 1.37* 2.78* 1.96*   BNP Invalid input(s): POCBNP D-Dimer  Recent Labs  05/28/15 0852  DDIMER 0.82*   TELE NSR with HR 40-60s    ECG  NSR with deep TWI in precordial leads  Echocardiogram 05/28/2015  LV EF: 40% -  45%  ------------------------------------------------------------------- Indications:   Chest pain 786.51.  ------------------------------------------------------------------- History:  PMH: Acute respiratory failure with hypoxia. Acute myocardial infarction. Risk factors: Current tobacco use. Dyslipidemia.  ------------------------------------------------------------------- Study Conclusions  - Left ventricle: The cavity size was  normal. Wall thickness was increased in a pattern of mild LVH. Systolic function was mildly to moderately reduced. The estimated ejection fraction was in the range of 40% to 45%. Inferolateral, anterolateral, and apical hypokinesis. Doppler parameters are consistent with abnormal left ventricular relaxation (grade 1 diastolic dysfunction). - Aortic valve: There was no stenosis. - Mitral valve: Mildly calcified annulus. There was no significant regurgitation. - Right ventricle: The cavity size was normal. Systolic function was normal. - Pulmonary arteries: No complete TR doppler jet so unable to estimate PA systolic pressure. - Inferior vena cava: The  vessel was normal in size. The respirophasic diameter changes were in the normal range (>= 50%), consistent with normal central venous pressure.  Impressions:  - Normal LV size with mild LV hypertrophy. EF 40-45% with wall motion abnormalities as noted above. Normal RV size and systolic function. No significant valvular abnormalities.     Radiology/Studies  Ct Angio Chest Pe W/cm &/or Wo Cm  05/29/2015  CLINICAL DATA:  Patient with recent cardiac catheterization and stent placement. Chest tightness. Elevated D-dimer. EXAM: CT ANGIOGRAPHY CHEST WITH CONTRAST TECHNIQUE: Multidetector CT imaging of the chest was performed using the standard protocol during bolus administration of intravenous contrast. Multiplanar CT image reconstructions and MIPs were obtained to evaluate the vascular anatomy. CONTRAST:  OMNIPAQUE IOHEXOL 350 MG/ML SOLN COMPARISON:  Chest radiograph 05/28/2015 FINDINGS: Mediastinum/Nodes: Normal heart size. No pericardial effusion. Aorta and main pulmonary artery are normal in caliber. Visualized thyroid is unremarkable. Multiple prominent and enlarged mediastinal and bilateral hilar lymph nodes are demonstrated including a 10 mm prevascular lymph node (image 29; series 4), a 13 mm left peritracheal lymph node (image 30; series 2) and a 20 mm right hilar lymph node (image 38; series 4). Small hiatal hernia. Adequate opacification of the pulmonary arterial system. No centralized filling defects are demonstrated to suggest acute pulmonary embolism. Lungs/Pleura: Central airways are patent. Smooth interlobular septal thickening predominantly within the upper lobes. Scattered bilateral ground-glass pulmonary opacities. Centrilobular and paraseptal emphysematous change. No large area of pulmonary consolidation. Trace bilateral pleural effusions. No pneumothorax. Upper abdomen: Unremarkable. Musculoskeletal: No evidence for pulmonary embolism. Review of the MIP images confirms  the above findings. IMPRESSION: No evidence for pulmonary embolism. Enlarged mediastinal and bilateral hilar lymphadenopathy which is nonspecific in etiology. This may potentially be infectious or inflammatory. Sarcoidosis is an additional consideration. Recommend clinical and laboratory correlation. Additionally followup chest CT in 3 months is recommended to ensure stability. Diffuse bilateral ground-glass pulmonary opacities as well as smooth interlobular septal thickening may be secondary to pulmonary edema. Atypical infectious process or more chronic pulmonary process such as interstitial lung disease is not excluded. Recommend attention on followup. Centrilobular and paraseptal emphysematous change. Electronically Signed   By: Annia Belt M.D.   On: 05/29/2015 16:41   Dg Chest Port 1 View  05/28/2015  CLINICAL DATA:  Acute onset of shortness of breath. Initial encounter. EXAM: PORTABLE CHEST 1 VIEW COMPARISON:  Chest radiograph performed earlier today at 2:52 a.m. FINDINGS: The lungs are well-aerated. Persistent bibasilar airspace opacities are perhaps slightly improved from the prior study, and may reflect pulmonary edema or pneumonia. There is no evidence of pleural effusion or pneumothorax. The cardiomediastinal silhouette is within normal limits. No acute osseous abnormalities are seen. IMPRESSION: Persistent bibasilar airspace opacities are perhaps slightly improved from the prior study, and may reflect pulmonary edema or pneumonia. Electronically Signed   By: Roanna Raider M.D.   On: 05/28/2015 04:44    ASSESSMENT  AND PLAN  Pt with PMH of tobacco abuse, nonobstructive CAD on cath ('95 and '07) who were last seen by Dr. [place] in 2007 presented with severe dyspnea, inability to lie flat, recent hemoptysis and minimal chest discomfort. Initially transferred as anterior STEMI, STEMI cancelled on further review.  1. NSTEMI  - initially transferred as anterior STEMI, code STEMI cancelled given  lack of true ST elevation.   - cath 05/28/2015 80% mid LAD treated with 3.026 mm Resolute DES stent postdilated to 3.26 mm, 25% LM lesion, 10% prox RCA  - continue ASA and Brilinta, no BB due to bradycardia. R radial cath site stable  - echo 05/28/2015 EF 40-45%, grade 1 diastolic dysfunction, no significant valvular abnormalities.  2. Positive d-dimer: 0.8. However relatively lower suspicion for PE - no further testing necessary.   3. Productive cough: on rocephin per IM  4. ?hemoptysis: likely bleeding source was from teeth pulled on Tue 10/25 instead of intrapulm source  5. HTN - controlled  6. Acute systolic congestive heart failure-  2L urine output overnight - now with low sodium and chloride. Does not appear volume overloaded. Weight almost back to baseline. Will switch to po diuretics, starting tomorrow - allow kidneys to rest today.  Dispo - per IM, but suspect he can be discharged home today. Follow-up with Dr. Delton See.  Chrystie Nose, MD, [facility] Attending Cardiologist [facility]  Lisette Abu [facility] 05/30/2015

## 2015-05-30 NOTE — Progress Notes (Signed)
CARDIAC REHAB PHASE I   PRE:  Rate/Rhythm: 71 NSR  BP:  Supine: 113/49 Sitting:   Standing:    SaO2: 96% RA  MODE:  Ambulation: 600 ft   POST:  Rate/Rhythm: 81  BP:  Supine:   Sitting: 96/48  Standing:    SaO2: 99% RA  [id number] Pt tolerated ambulation well with assist x1, no c/o, denies CP. BP low after ambulation, asymptomatic, otherwise vital signs stable.   Artist Pais, MS, ACSM CCEP

## 2015-05-30 NOTE — Discharge Summary (Signed)
Physician Discharge Summary  Cameron Hoover:[id number] DOB: 27-Apr-1956 DOA: 05/28/2015  PCP: No PCP Per Patient  Admit date: 05/28/2015 Discharge date: 05/30/2015  Recommendations for Outpatient Follow-up:  1. Pt will need to follow up with PCP in 2 weeks post discharge 2. BMP in one week   Discharge Diagnoses:  Acute respiratory failure with hypoxia -due to pulmonary edema -PCT 0.17, however patient had leukocytosis with a WBC 13.1  -Continue ceftriaxone and azithromycin  -Presently stable on 3 L nasal cannula without increased work of breathing  -pt weaned to RA and remained stable prior to d/c NSTEMI -Continue to cycle troponins--peaked at 2.78 -appreciate cardiology consult -05/28/2015 cardiac catheterization--80% mid LAD status post DES;25% LM lesion, 10% prox RCA -continue ASA and Brillinta -Continue nitroglycerin per cardiology -start atorvastatin  daily -Echo--EF 40-45 percent, grade 1 DD, inferolateral, anterolateral, apical hypokinesis -case discussed with Dr. Rennis Golden on day of d/c who stated pt can return to work one week after catheterization without restriction -[facility] to arrange outpt follow up  Elevated d-dimer  -the patient continues to have some vague chest discomfort despite cardiac intervention -PERC score 2 -CTA chest--neg for PE with prominent hilar and mediastinal lymphadenopathy--while this may be reactive to the patient's acute bronchitis, he will need outpt followup to ensure resolution or further workup Pulmonary Infiltrates/Pulmonary edema/Acute systolic and diastolic CHF -Repeat chest x-ray in a.m. 05/29/2015  -Hold off on additional furosemide doses on this patient develops worsening respiratory symptoms  -Continue empiric antibiotics for now  -Furosemide 60 mg IV 1 given at 4:30 AM 05/28/2015 -furosemide continued-->weaned to RA -home with furosemide  po daily starting 05/31/15 Acute bronchitis -Home with 2 additional  days of azithromycin which will finish 5 days of therapy -WBC 10.3 on day of d/c Tobacco abuse  -Cessation discussed  -NicoDerm patch  Questionable hemoptysis -The patient recently had dental extraction 9 on 05/27/2015 -Suspect the patient likely had some bleeding from his dental extractions   Discharge Condition: stable  Disposition: home Follow-up Information    Follow up with Lars Masson, MD In 1 week.   Specialty:  Cardiology   Contact information:   [address] [place] [address] [phone]       Diet:heart healthy Wt Readings from Last 3 Encounters:  05/30/15 74.4 kg (164 lb 0.4 oz)    History of present illness:  59 year old male with a history of hyperlipidemia, nonobstructive CAD, and tobacco abuse presented with exertional chest discomfort on and off for the past several weeks. However the patient experienced worsening chest discomfort, shortness of breath, diaphoresis while at work on the evening of 05/27/2015. The patient initially presented to the emergency department at [facility]. Code STEMI was initially called, but his ECG demonstrated early repolarization. He was subsequently transferred to [facility]. EKG in the emergency department revealed T-wave inversion in the precordial leads, V3-V6. The patient also had elevated troponin of 0.28. There was concern for NSTEMI. The patient will start on heparin drip and nitroglycerin drip. He is currently pain-free. Cardiology was consulted to assist with management.The patient was taken to heart catheterization 05/28/2015. Drug-eluting stent was placed in the mid LAD. The patient was started on aspirin and Brillinta. The patient did develop coronary edema during the hospitalization secondary to his NSTEMI. He was started on each fetus furosemide with good clinical effect. The patient will be discharged home with furosemide 20 mg daily.   Consultants: Cardiology  Discharge  Exam: Filed Vitals:  Cameron Dibbles65m  effusions. No pneumothorax. Upper abdomen: Unremarkable. Musculoskeletal: No evidence for pulmonary embolism. Review of the MIP images confirms the above findings. IMPRESSION: No evidence for pulmonary embolism. Enlarged mediastinal and bilateral hilar lymphadenopathy which is nonspecific in etiology. This may potentially be infectious or inflammatory. Sarcoidosis is an additional consideration. Recommend clinical and laboratory correlation. Additionally followup chest CT in 3 months is recommended to ensure  stability. Diffuse bilateral ground-glass pulmonary opacities as well as smooth interlobular septal thickening may be secondary to pulmonary edema. Atypical infectious process or more chronic pulmonary process such as interstitial lung disease is not excluded. Recommend attention on followup. Centrilobular and paraseptal emphysematous change. Electronically Signed   By: Annia Belt M.D.   On: 05/29/2015 16:41   Dg Chest Port 1 View  05/28/2015  CLINICAL DATA:  Acute onset of shortness of breath. Initial encounter. EXAM: PORTABLE CHEST 1 VIEW COMPARISON:  Chest radiograph performed earlier today at 2:52 a.m. FINDINGS: The lungs are well-aerated. Persistent bibasilar airspace opacities are perhaps slightly improved from the prior study, and may reflect pulmonary edema or pneumonia. There is no evidence of pleural effusion or pneumothorax. The cardiomediastinal silhouette is within normal limits. No acute osseous abnormalities are seen. IMPRESSION: Persistent bibasilar airspace opacities are perhaps slightly improved from the prior study, and may reflect pulmonary edema or pneumonia. Electronically Signed   By: Roanna Raider M.D.   On: 05/28/2015 04:44     Microbiology: Recent Results (from the past 240 hour(s))  Culture, blood (routine x 2)     Status: None (Preliminary result)   Collection Time: 05/28/15  4:58 AM  Result Value Ref Range Status   Specimen Description BLOOD FOREARM RIGHT  Final   Special Requests BOTTLES DRAWN AEROBIC ONLY 10CC  Final   Culture NO GROWTH 1 DAY  Final   Report Status PENDING  Incomplete  Culture, blood (routine x 2)     Status: None (Preliminary result)   Collection Time: 05/28/15  5:03 AM  Result Value Ref Range Status   Specimen Description BLOOD HAND RIGHT  Final   Special Requests BOTTLES DRAWN AEROBIC ONLY 10CC  Final   Culture NO GROWTH 1 DAY  Final   Report Status PENDING  Incomplete  MRSA PCR Screening     Status: None   Collection Time: 05/28/15  6:58  AM  Result Value Ref Range Status   MRSA by PCR NEGATIVE NEGATIVE Final    Comment:        The GeneXpert MRSA Assay (FDA approved for NASAL specimens only), is one component of a comprehensive MRSA colonization surveillance program. It is not intended to diagnose MRSA infection nor to guide or monitor treatment for MRSA infections.      Labs: Basic Metabolic Panel:  Recent Labs Lab 05/28/15 0400 05/29/15 0605 05/30/15 0439  NA 139 135 129*  K 4.3 3.2* 4.1  CL 108 102 98*  CO2 GLUCOSE 110* 120* 107*  BUN CREATININE 0.88 0.90 0.87  CALCIUM 8.7* 9.1 9.3   Liver Function Tests:  Recent Labs Lab 05/28/15 0400  AST 31  ALT 26  ALKPHOS 113  BILITOT 0.5  PROT 7.1  ALBUMIN 3.1*   No results for input(s): LIPASE, AMYLASE in the last 168 hours. No results for input(s): AMMONIA in the last 168 hours. CBC:  Recent Labs Lab 05/28/15 0400 05/29/15 0605 05/30/15 0439  WBC 13.1* 9.6 10.3  NEUTROABS 10.6*  --   --   HGB  15.2 13.8 15.5  HCT 46.6 40.6 45.4  MCV 94.3 91.4 90.6  PLT 284 249 291   Cardiac Enzymes:  Recent Labs Lab 05/28/15 0400 05/28/15 0718 05/28/15 1355 05/28/15 1946  TROPONINI 0.28* 1.37* 2.78* 1.96*   BNP: Invalid input(s): POCBNP CBG: No results for input(s): GLUCAP in the last 168 hours.  Time coordinating discharge:  Greater than 30 minutes  Signed:  Bani Gianfrancesco, DO [facility] Pager: [phone] 05/30/2015, 10:48 AM

## 2015-06-02 LAB — CULTURE, BLOOD (ROUTINE X 2)
CULTURE: NO GROWTH
Culture: NO GROWTH

## 2015-06-11 ENCOUNTER — Encounter: Payer: Self-pay | Admitting: Cardiology

## 2015-06-11 ENCOUNTER — Ambulatory Visit (INDEPENDENT_AMBULATORY_CARE_PROVIDER_SITE_OTHER): Payer: BLUE CROSS/BLUE SHIELD | Admitting: Cardiology

## 2015-06-11 ENCOUNTER — Encounter: Payer: Self-pay | Admitting: *Deleted

## 2015-06-11 VITALS — BP 124/58 | HR 65 | Ht 66.0 in | Wt 164.0 lb

## 2015-06-11 DIAGNOSIS — I5021 Acute systolic (congestive) heart failure: Secondary | ICD-10-CM

## 2015-06-11 DIAGNOSIS — E785 Hyperlipidemia, unspecified: Secondary | ICD-10-CM | POA: Diagnosis not present

## 2015-06-11 DIAGNOSIS — I214 Non-ST elevation (NSTEMI) myocardial infarction: Secondary | ICD-10-CM | POA: Diagnosis not present

## 2015-06-11 MED ORDER — LISINOPRIL 2.5 MG PO TABS: 2.5000 mg | ORAL_TABLET | Freq: Every day | ORAL | Status: DC

## 2015-06-11 NOTE — Progress Notes (Signed)
Patient ID: Cameron Hoover, male   DOB: 1956/02/22, 59 y.o.   MRN: [id number]      Cardiology Office Note   Date:  06/11/2015   ID:  Cameron Hoover, DOB 1955-11-01, MRN [id number]  PCP:  No PCP Per Patient  Cardiologist:  Lars Masson, MD   Chief complain: Post hospital follow up   History of Present Illness: Cameron Hoover is a 59 y.o. male with PMH of tobacco abuse, nonobstructive CAD on cath ('95 and '07) who were last seen by Dr. Swaziland in 2007 presented with severe dyspnea, inability to lie flat, recent hemoptysis and minimal chest discomfort. Initially transferred as anterior STEMI, STEMI cancelled on further review. Underwent cardiac cath on 05/28/15 with 80% mid LAD treated with 3.026 mm Resolute DES stent postdilated to 3.26 mm, 25% LM lesion, 10% prox RCA, started on ASA and Brilinta, no BB due to bradycardia. R radial cath site stable, echo on 05/28/2015 showed EF 40-45%, grade 1 diastolic dysfunction, no significant valvular abnormalities. He presented with an acute CHF and was diuresed in the hospital.  Today he denies CP, improved SOB he has residual fatigue, no LE edema, no orthopnea, PND, no bleeding, no muscle pain.  Past Medical History  Diagnosis Date  . Coronary artery disease     Nonobstructive by LHC in 2007  . Hyperlipidemia     Past Surgical History  Procedure Laterality Date  . Cardiac catheterization  2007  . Cardiac catheterization N/A 05/28/2015    Procedure: Left Heart Cath and Cors/Grafts Angiography;  Surgeon: Lennette Bihari, MD;  Location[facility] INVASIVE CV LAB;  Service: Cardiovascular;  Laterality: N/A;  . Cardiac catheterization N/A 05/28/2015    Procedure: Coronary Stent Intervention;  Surgeon: Lennette Bihari, MD;  Location: [facility] INVASIVE CV LAB;  Service: Cardiovascular;  Laterality: N/A;     Current Outpatient Prescriptions  Medication Sig Dispense Refill  . aspirin EC 81 MG EC tablet Take 1 tablet (81 mg total) by mouth daily. 30 tablet 0  .  atorvastatin (LIPITOR) 80 MG tablet Take 1 tablet (80 mg total) by mouth daily at 6 PM. 30 tablet 0  . furosemide (LASIX) 20 MG tablet Take 1 tablet (20 mg total) by mouth daily. 30 tablet 0  . ticagrelor (BRILINTA) 90 MG TABS tablet Take 1 tablet (90 mg total) by mouth 2 (two) times daily. 60 tablet 0   No current facility-administered medications for this visit.    Allergies:   Review of patient's allergies indicates no known allergies.    Social History:  The patient  reports that he has been smoking Cigarettes.  He has a 46 pack-year smoking history. He has never used smokeless tobacco. He reports that he drinks about 8.4 oz of alcohol per week. He reports that he does not use illicit drugs.   Family History:  The patient's family history includes Cancer in his father and mother; Diabetes in his mother; Emphysema in his father; Heart attack in his brother and brother; Heart attack (age of onset: 2) in his cousin.    ROS:  Please see the history of present illness.   Otherwise, review of systems are positive for none.   All other systems are reviewed and negative.    PHYSICAL EXAM: VS:  BP 124/58 mmHg  Pulse 65  Ht  (1.676 m)  Wt 164 lb (74.39 kg)  BMI 26.48 kg/m2  SpO2 97% , BMI Body mass index is 26.48 kg/(m^2). GEN: Well  nourished, well developed, in no acute distress HEENT: normal Neck: no JVD, carotid bruits, or masses Cardiac: RRR; no murmurs, rubs, or gallops,no edema  Respiratory:  clear to auscultation bilaterally, normal work of breathing GI: soft, nontender, nondistended, + BS MS: no deformity or atrophy Skin: warm and dry, no rash Neuro:  Strength and sensation are intact Psych: euthymic mood, full affect  EKG:  EKG is not ordered today.  Recent Labs: 05/28/2015: ALT 26; B Natriuretic Peptide 133.1* 05/30/2015: BUN 13; Creatinine, Ser 0.87; Hemoglobin 15.5; Platelets 291; Potassium 4.1; Sodium 129*    Lipid Panel No results found for: CHOL, TRIG, HDL,  CHOLHDL, VLDL, LDLCALC, LDLDIRECT    Wt Readings from Last 3 Encounters:  06/11/15 164 lb (74.39 kg)  05/30/15 164 lb 0.4 oz (74.4 kg)     Left cardiac cath: 05/28/2015  LM lesion, 25% stenosed.  Prox RCA lesion, 10% stenosed.  Mid LAD lesion, 80% stenosed. Post intervention, there is a 0% residual stenosis.  There is mild left ventricular systolic dysfunction.  Mild LV dysfunction with an ejection fraction of 50% with suggestion of a small region of mid anterolateral and mid inferior hypokinesis.  Coronary obstructive disease with mild 25% stenosis in the distal left main, 80% eccentric stenosis in the proximal to mid LAD, normal ramus intermediate, normal left circumflex, and dominant RCA with smooth 10% proximal narrowing.   Successful PCI to the LAD with ultimate insertion of a 3.026 mm Resolute DES stent postdilated to 3.26 mm with the 80% eccentric stenosis being reduced to 0%. There was TIMI-3 flow and no evidence for dissection.  RECOMMENDATION: The patient should maintain on dual antiplatelet therapy . ideally for up to a year. The patient had a mildly positive d-dimer and if there are still concerns for PE, further evaluation with CT angiogram or a VQ scan should be obtained.  TTE: 05/28/2015 Left ventricle: The cavity size was normal. Wall thickness was increased in a pattern of mild LVH. Systolic function was mildly to moderately reduced. The estimated ejection fraction was in the range of 40% to 45%. Inferolateral, anterolateral, and apical hypokinesis. Doppler parameters are consistent with abnormal left ventricular relaxation (grade 1 diastolic dysfunction). - Aortic valve: There was no stenosis. - Mitral valve: Mildly calcified annulus. There was no significant regurgitation. - Right ventricle: The cavity size was normal. Systolic function was normal. - Pulmonary arteries: No complete TR doppler jet so unable to estimate PA systolic  pressure. - Inferior vena cava: The vessel was normal in size. The respirophasic diameter changes were in the normal range (>= 50%), consistent with normal central venous pressure.  Impressions: - Normal LV size with mild LV hypertrophy. EF 40-45% with wall motion abnormalities as noted above. Normal RV size and systolic function. No significant valvular abnormalities.   ASSESSMENT AND PLAN:  Pt with PMH of tobacco abuse, nonobstructive CAD on cath ('95 and '07) who were last seen by Dr. Sw[place]and in 2007 presented with severe dyspnea, inability to lie flat, recent hemoptysis and minimal chest discomfort. Initially transferred as anterior STEMI, STEMI cancelled on further review.  1. NSTEMI - initially transferred as anterior STEMI, code STEMI cancelled given lack of true ST elevation.  - cath 05/28/2015 80% mid LAD treated with 3.026 mm Resolute DES stent postdilated to 3.26 mm, 25% LM lesion, 10% prox RCA - continue ASA and Brilinta, no BB due to bradycardia. R radial cath site stable - echo 05/28/2015 EF 40-45%, grade 1 diastolic dysfunction, no significant valvular abnormalities.  2. Positive d-dimer: 0.8. However relatively lower suspicion for PE - no further testing necessary.   3. HTN - controlled  4. Acute systolic congestive heart failure- continue lasix 20 mg po daily Add lisinopril 2.5 mg po daily  He will need a letter to work that  He should stay at home for another 2 weeks as he carries 60-8- lbs objects.  Follow up in 6 weeks with echo and CMP< CBC prior to the appointment.   Signed, Lars Masson, MD  06/11/2015 1:57 PM    [facility] [address] [place], [place], [place]  [address] Phone: [phone]; Fax: [phone]

## 2015-06-11 NOTE — Patient Instructions (Signed)
Medication Instructions:   START TAKING LISINOPRIL 2.5 MG ONCE DAILY    Labwork:  PRIOR TO YOUR 2 MONTH FOLLOW-UP APPOINTMENT WITH DR Delton See TO CHECK A ---CMET AND CBC W DIFF    Testing/Procedures:  Your physician has requested that you have an echocardiogram. Echocardiography is a painless test that uses sound waves to create images of your heart. It provides your doctor with information about the size and shape of your heart and how well your heart's chambers and valves are working. This procedure takes approximately one hour. There are no restrictions for this procedure.  PER DR NELSON THIS ECHO NEEDS TO BE SCHEDULED FOR 2 MONTHS OUT     Follow-Up:  2 MONTHS WITH DR NELSON---PLEASE HAVE YOUR ECHO AND LABS DONE PRIOR TO THIS APPOINTMENT.     If you need a refill on your cardiac medications before your next appointment, please call your pharmacy.

## 2015-07-01 ENCOUNTER — Other Ambulatory Visit: Payer: Self-pay | Admitting: *Deleted

## 2015-07-01 ENCOUNTER — Telehealth: Payer: Self-pay | Admitting: Cardiology

## 2015-07-01 MED ORDER — FUROSEMIDE 20 MG PO TABS: 20.0000 mg | ORAL_TABLET | Freq: Every day | ORAL | Status: DC

## 2015-07-01 MED ORDER — ATORVASTATIN CALCIUM 80 MG PO TABS: 80.0000 mg | ORAL_TABLET | Freq: Every day | ORAL | Status: DC

## 2015-07-01 NOTE — Telephone Encounter (Signed)
Yes please refill these meds as noted in Dr Lindaann Slough last OV note in November 2016.

## 2015-07-01 NOTE — Telephone Encounter (Signed)
Patient was started on requested medications by Dr Tat. Ok to refill? Please advise. Thanks, MI

## 2015-07-01 NOTE — Telephone Encounter (Signed)
New message   *STAT* If patient is at the pharmacy, call can be transferred to refill team.   1. Which medications need to be refilled? (please list name of each medication and dose if known) furosemide 20mg  & atorvastatin 80mg    2. Which pharmacy/location (including street and city if local pharmacy) is medication to be sent to? [facility] [address] [place]  3. Do they need a 30 day or 90 day supply? 90 day

## 2015-07-01 NOTE — Telephone Encounter (Signed)
Rx's sent in. °

## 2015-07-06 ENCOUNTER — Telehealth: Payer: Self-pay | Admitting: Cardiology

## 2015-07-06 NOTE — Telephone Encounter (Signed)
[employer] Claim form has been completed by Dr.Nelson. I called # [phone] and it says " it has been changed or disconnected"  I am unavailable to leave message for patient letting him know paperwork is ready for his pick up.

## 2015-07-07 ENCOUNTER — Other Ambulatory Visit: Payer: Self-pay | Admitting: *Deleted

## 2015-07-07 MED ORDER — TICAGRELOR 90 MG PO TABS: 90.0000 mg | ORAL_TABLET | Freq: Two times a day (BID) | ORAL | Status: DC

## 2015-07-10 ENCOUNTER — Telehealth: Payer: Self-pay | Admitting: Cardiology

## 2015-07-10 NOTE — Telephone Encounter (Signed)
New message     Need note to return to work.  Patient returned to work on dec 1 but did not have a note.  Please call his job at [phone] to get their fax number to fax note

## 2015-07-10 NOTE — Telephone Encounter (Signed)
Yes, he can return to work, I will in the reading room if you want me to sign it

## 2015-07-10 NOTE — Telephone Encounter (Signed)
Unable to get patient's work fax number from number patient supplied. Informed patient. Patient will call on Monday with fax number.

## 2015-07-10 NOTE — Telephone Encounter (Signed)
Spoke with patient he is aware [employer] paper is ready for pick up.

## 2015-07-10 NOTE — Telephone Encounter (Signed)
According to Dr. Lindaann Slough office note on 06/11/15 " He will need a letter to work that He should stay at home for another 2 weeks as he carries 60-8- lbs objects." Patient had a letter stating he needed to be out of work for the rest of November. Will forward to Dr. Delton See for the okay for patient to return to work.

## 2015-07-13 NOTE — Telephone Encounter (Signed)
Foloow Up   Cameron Hoover with Cameron Hoover,  The pt's employer called states that she has received the letter via fax. However, she states that she has received the disability form that list his limitations and not so much that he can return back to work. Please call back to clarify if he can in fact return to full duty.

## 2015-07-13 NOTE — Telephone Encounter (Signed)
Work note and letter faxed to Attn: Judith Blonder [phone]

## 2015-07-13 NOTE — Telephone Encounter (Signed)
[phone] att: [place] fax number for return to work note

## 2015-07-14 ENCOUNTER — Encounter: Payer: Self-pay | Admitting: *Deleted

## 2015-07-14 NOTE — Telephone Encounter (Signed)
Follow up     Employer got note to return to work, however, on patients discharge instructions from hosp, it had restrictions.  Employer want to make sure there are no restrictions for pt to return to work.  If there are restrictions, please refax note with restrictions to [phone] attn Judith Blonder

## 2015-07-14 NOTE — Telephone Encounter (Signed)
Will route this message to Dr Delton See to advise on if the pt should be on restrictions when he returns to work or not.  Return to work letter written by Elita Quick RN and Dr Delton See signed on 07/10/15.  Will follow-up with employer after Dr Delton See advises on work restrictions or no work restrictions.

## 2015-07-14 NOTE — Telephone Encounter (Signed)
Return to work letter re-written for the pts employer, that states per Dr Delton See, the pt may return to work as of 07/02/15 with lifting restriction of no more than 15 lbs x 2 months, then he can proceed with normal lifting duties thereafter.  We will refax this letter to [phone] attention Judith Blonder

## 2015-08-11 ENCOUNTER — Other Ambulatory Visit (HOSPITAL_COMMUNITY): Payer: BLUE CROSS/BLUE SHIELD

## 2015-08-11 ENCOUNTER — Other Ambulatory Visit: Payer: BLUE CROSS/BLUE SHIELD

## 2015-08-19 ENCOUNTER — Ambulatory Visit (HOSPITAL_COMMUNITY): Payer: BLUE CROSS/BLUE SHIELD | Attending: Cardiovascular Disease

## 2015-08-19 ENCOUNTER — Other Ambulatory Visit (INDEPENDENT_AMBULATORY_CARE_PROVIDER_SITE_OTHER): Payer: BLUE CROSS/BLUE SHIELD | Admitting: *Deleted

## 2015-08-19 ENCOUNTER — Other Ambulatory Visit: Payer: Self-pay

## 2015-08-19 DIAGNOSIS — I110 Hypertensive heart disease with heart failure: Secondary | ICD-10-CM | POA: Diagnosis not present

## 2015-08-19 DIAGNOSIS — I252 Old myocardial infarction: Secondary | ICD-10-CM | POA: Diagnosis not present

## 2015-08-19 DIAGNOSIS — I214 Non-ST elevation (NSTEMI) myocardial infarction: Secondary | ICD-10-CM

## 2015-08-19 DIAGNOSIS — E785 Hyperlipidemia, unspecified: Secondary | ICD-10-CM | POA: Insufficient documentation

## 2015-08-19 DIAGNOSIS — I5021 Acute systolic (congestive) heart failure: Secondary | ICD-10-CM | POA: Diagnosis not present

## 2015-08-19 DIAGNOSIS — I2510 Atherosclerotic heart disease of native coronary artery without angina pectoris: Secondary | ICD-10-CM | POA: Diagnosis not present

## 2015-08-19 LAB — COMPREHENSIVE METABOLIC PANEL
ALT: 35 U/L (ref 9–46)
AST: 26 U/L (ref 10–35)
Albumin: 4.4 g/dL (ref 3.6–5.1)
Alkaline Phosphatase: 151 U/L — ABNORMAL HIGH (ref 40–115)
BUN: 16 mg/dL (ref 7–25)
CO2: 24 mmol/L (ref 20–31)
Calcium: 9.9 mg/dL (ref 8.6–10.3)
Chloride: 101 mmol/L (ref 98–110)
Creat: 1.02 mg/dL (ref 0.70–1.33)
Glucose, Bld: 95 mg/dL (ref 65–99)
Potassium: 3.7 mmol/L (ref 3.5–5.3)
Sodium: 137 mmol/L (ref 135–146)
Total Bilirubin: 0.3 mg/dL (ref 0.2–1.2)
Total Protein: 8.2 g/dL — ABNORMAL HIGH (ref 6.1–8.1)

## 2015-08-19 LAB — CBC WITH DIFFERENTIAL/PLATELET
Basophils Absolute: 0 10*3/uL (ref 0.0–0.1)
Basophils Relative: 0 % (ref 0–1)
Eosinophils Absolute: 0.2 10*3/uL (ref 0.0–0.7)
Eosinophils Relative: 3 % (ref 0–5)
HCT: 43.4 % (ref 39.0–52.0)
Hemoglobin: 14.9 g/dL (ref 13.0–17.0)
Lymphocytes Relative: 37 % (ref 12–46)
Lymphs Abs: 2.8 10*3/uL (ref 0.7–4.0)
MCH: 31.3 pg (ref 26.0–34.0)
MCHC: 34.3 g/dL (ref 30.0–36.0)
MCV: 91.2 fL (ref 78.0–100.0)
MPV: 9 fL (ref 8.6–12.4)
Monocytes Absolute: 0.4 10*3/uL (ref 0.1–1.0)
Monocytes Relative: 5 % (ref 3–12)
Neutro Abs: 4.2 10*3/uL (ref 1.7–7.7)
Neutrophils Relative %: 55 % (ref 43–77)
Platelets: 372 10*3/uL (ref 150–400)
RBC: 4.76 MIL/uL (ref 4.22–5.81)
RDW: 13.7 % (ref 11.5–15.5)
WBC: 7.6 10*3/uL (ref 4.0–10.5)

## 2015-08-31 ENCOUNTER — Ambulatory Visit (INDEPENDENT_AMBULATORY_CARE_PROVIDER_SITE_OTHER): Payer: BLUE CROSS/BLUE SHIELD | Admitting: Cardiology

## 2015-08-31 ENCOUNTER — Encounter: Payer: Self-pay | Admitting: Cardiology

## 2015-08-31 ENCOUNTER — Encounter (INDEPENDENT_AMBULATORY_CARE_PROVIDER_SITE_OTHER): Payer: Self-pay

## 2015-08-31 VITALS — BP 126/62 | HR 79 | Ht 66.0 in | Wt 169.0 lb

## 2015-08-31 DIAGNOSIS — Z720 Tobacco use: Secondary | ICD-10-CM | POA: Diagnosis not present

## 2015-08-31 DIAGNOSIS — Z955 Presence of coronary angioplasty implant and graft: Secondary | ICD-10-CM | POA: Diagnosis not present

## 2015-08-31 DIAGNOSIS — E785 Hyperlipidemia, unspecified: Secondary | ICD-10-CM | POA: Diagnosis not present

## 2015-08-31 DIAGNOSIS — I5021 Acute systolic (congestive) heart failure: Secondary | ICD-10-CM

## 2015-08-31 DIAGNOSIS — I214 Non-ST elevation (NSTEMI) myocardial infarction: Secondary | ICD-10-CM

## 2015-08-31 DIAGNOSIS — I209 Angina pectoris, unspecified: Secondary | ICD-10-CM

## 2015-08-31 MED ORDER — METOPROLOL SUCCINATE ER 25 MG PO TB24: 25.0000 mg | ORAL_TABLET | Freq: Every day | ORAL | Status: DC

## 2015-08-31 NOTE — Progress Notes (Signed)
Patient ID: Cameron Hoover, male   DOB: Nov 20, 1955, 60 y.o.   MRN: [id number]      Cardiology Office Note  Date:  08/31/2015   ID:  Cameron Hoover, DOB 1956/02/19, MRN [id number]  PCP:  No PCP Per Patient  Cardiologist:  Lars Masson, MD   Chief complain: Post hospital follow up   History of Present Illness: Cameron Hoover is a 60 y.o. male with PMH of tobacco abuse, nonobstructive CAD on cath ('95 and '07) who were last seen by Dr. Swaziland in 2007 presented with severe dyspnea, inability to lie flat, recent hemoptysis and minimal chest discomfort. Initially transferred as anterior STEMI, STEMI cancelled on further review. Underwent cardiac cath on 05/28/15 with 80% mid LAD treated with 3.026 mm Resolute DES stent postdilated to 3.26 mm, 25% LM lesion, 10% prox RCA, started on ASA and Brilinta, no BB due to bradycardia. R radial cath site stable, echo on 05/28/2015 showed EF 40-45%, grade 1 diastolic dysfunction, no significant valvular abnormalities. He presented with an acute CHF and was diuresed in the hospital.  Today he denies CP, improved SOB he has residual fatigue, no LE edema, no orthopnea, PND, no bleeding, no muscle pain.  08/31/2015 - 2 months follow up, no chest pain, SOB, he has chronic muscle cramping, not related to lipitor as it started before he was started on it. No bleeding. No orthopnea, paroxysmal nocturnal dyspnea or lower extremity edema.  Past Medical History  Diagnosis Date  . Coronary artery disease     Nonobstructive by LHC in 2007  . Hyperlipidemia     Past Surgical History  Procedure Laterality Date  . Cardiac catheterization  2007  . Cardiac catheterization N/A 05/28/2015    Procedure: Left Heart Cath and Cors/Grafts Angiography;  Surgeon: Lennette Bihari, MD;  Location: [facility] INVASIVE CV LAB;  Service: Cardiovascular;  Laterality: N/A;  . Cardiac catheterization N/A 05/28/2015    Procedure: Coronary Stent Intervention;  Surgeon: Lennette Bihari, MD;  Location:  [facility] INVASIVE CV LAB;  Service: Cardiovascular;  Laterality: N/A;   Current Outpatient Prescriptions  Medication Sig Dispense Refill  . aspirin EC 81 MG EC tablet Take 1 tablet (81 mg total) by mouth daily. 30 tablet 0  . atorvastatin (LIPITOR) 80 MG tablet Take 1 tablet (80 mg total) by mouth daily at 6 PM. 90 tablet 3  . furosemide (LASIX) 20 MG tablet Take 1 tablet (20 mg total) by mouth daily. 90 tablet 3  . lisinopril (PRINIVIL,ZESTRIL) 2.5 MG tablet Take 1 tablet (2.5 mg total) by mouth daily. 90 tablet 3  . ticagrelor (BRILINTA) 90 MG TABS tablet Take 1 tablet (90 mg total) by mouth 2 (two) times daily. 60 tablet 10   No current facility-administered medications for this visit.   Allergies:   Review of patient's allergies indicates no known allergies.   Social History:  The patient  reports that he has been smoking Cigarettes.  He has a 46 pack-year smoking history. He has never used smokeless tobacco. He reports that he drinks about 8.4 oz of alcohol per week. He reports that he does not use illicit drugs.   Family History:  The patient's family history includes Cancer in his father and mother; Diabetes in his mother; Emphysema in his father; Heart attack in his brother and brother; Heart attack (age of onset: 74) in his cousin.   ROS:  Please see the history of present illness.   Otherwise, review of systems are positive  for none.   All other systems are reviewed and negative.   PHYSICAL EXAM: VS:  BP 126/62 mmHg  Pulse 79  Ht  (1.676 m)  Wt 169 lb (76.658 kg)  BMI 27.29 kg/m2 , BMI Body mass index is 27.29 kg/(m^2). GEN: Well nourished, well developed, in no acute distress HEENT: normal Neck: no JVD, carotid bruits, or masses Cardiac: RRR; no murmurs, rubs, or gallops,no edema  Respiratory:  clear to auscultation bilaterally, normal work of breathing GI: soft, nontender, nondistended, + BS MS: no deformity or atrophy Skin: warm and dry, no rash Neuro:  Strength and  sensation are intact Psych: euthymic mood, full affect  EKG:  EKG is not ordered today.  Recent Labs: 05/28/2015: B Natriuretic Peptide 133.1* 08/19/2015: ALT 35; BUN 16; Creat 1.02; Hemoglobin 14.9; Platelets 372; Potassium 3.7; Sodium 137   Lipid Panel No results found for: CHOL, TRIG, HDL, CHOLHDL, VLDL, LDLCALC, LDLDIRECT   Wt Readings from Last 3 Encounters:  08/31/15 169 lb (76.658 kg)  06/11/15 164 lb (74.39 kg)  05/30/15 164 lb 0.4 oz (74.4 kg)    Left cardiac cath: 05/28/2015  LM lesion, 25% stenosed.  Prox RCA lesion, 10% stenosed.  Mid LAD lesion, 80% stenosed. Post intervention, there is a 0% residual stenosis.  There is mild left ventricular systolic dysfunction.  Mild LV dysfunction with an ejection fraction of 50% with suggestion of a small region of mid anterolateral and mid inferior hypokinesis.  Coronary obstructive disease with mild 25% stenosis in the distal left main, 80% eccentric stenosis in the proximal to mid LAD, normal ramus intermediate, normal left circumflex, and dominant RCA with smooth 10% proximal narrowing.   Successful PCI to the LAD with ultimate insertion of a 3.026 mm Resolute DES stent postdilated to 3.26 mm with the 80% eccentric stenosis being reduced to 0%. There was TIMI-3 flow and no evidence for dissection.  RECOMMENDATION: The patient should maintain on dual antiplatelet therapy . ideally for up to a year. The patient had a mildly positive d-dimer and if there are still concerns for PE, further evaluation with CT angiogram or a VQ scan should be obtained.  TTE: 05/28/2015 Left ventricle: The cavity size was normal. Wall thickness was increased in a pattern of mild LVH. Systolic function was mildly to moderately reduced. The estimated ejection fraction was in the range of 40% to 45%. Inferolateral, anterolateral, and apical hypokinesis. Doppler parameters are consistent with abnormal left ventricular relaxation  (grade 1 diastolic dysfunction). - Aortic valve: There was no stenosis. - Mitral valve: Mildly calcified annulus. There was no significant regurgitation. - Right ventricle: The cavity size was normal. Systolic function was normal. - Pulmonary arteries: No complete TR doppler jet so unable to estimate PA systolic pressure. - Inferior vena cava: The vessel was normal in size. The respirophasic diameter changes were in the normal range (>= 50%), consistent with normal central venous pressure.  Impressions: - Normal LV size with mild LV hypertrophy. EF 40-45% with wall motion abnormalities as noted above. Normal RV size and systolic function. No significant valvular abnormalities.   ASSESSMENT AND PLAN:  Pt with PMH of tobacco abuse, nonobstructive CAD on cath ('95 and '07) who were last seen by Dr. Swaziland in 2007 presented with severe dyspnea, inability to lie flat, recent hemoptysis and minimal chest discomfort. Initially transferred as anterior STEMI, STEMI cancelled on further review.  1. NSTEMI - initially transferred as anterior STEMI, code STEMI cancelled given lack of true ST elevation.  -  cath 05/28/2015 80% mid LAD treated with 3.026 mm Resolute DES stent postdilated to 3.26 mm, 25% LM lesion, 10% prox RCA - continue ASA and Brilinta, no BB due to bradycardia. R radial cath site stable - echo 05/28/2015 EF 40-45%, grade 1 diastolic dysfunction, no significant valvular abnormalities. His repeat echocardiogram showed LVEF of 35-40%. We are adding Toprol-XL 25 mg daily to his regimen.  2. Positive d-dimer: 0.8. However relatively lower suspicion for PE - no further testing necessary.   3. HTN - controlled  4. Acute  on chronic systolic congestive heart failure- continue lasix 20 mg po daily, he appears euvolemic.   continue lisinopril  2.5 mg po daily, add Toprol-XL 25 mg daily.   We will be filling out paperwork  today that he is able to return back to work, also feel short-term disability papers from December 9 until 5/31.  Follow up in 3 months.  Signed, Lars Masson, MD  08/31/2015 1:31 PM    [facility] [address] [place], [place], [place]  [address] Phone: [phone]; Fax: [phone]

## 2015-08-31 NOTE — Patient Instructions (Signed)
**Note De-Identified Cameron Hoover Obfuscation** Your physician wants you to follow-up in: 3 MONTHS  You will receive a reminder letter in the mail two months in advance. If you don't receive a letter, please call our office to schedule the follow-up appointment.   You will start Metoprol XL (Toprol) 25 mg once daily.

## 2015-11-23 ENCOUNTER — Ambulatory Visit (INDEPENDENT_AMBULATORY_CARE_PROVIDER_SITE_OTHER): Payer: BLUE CROSS/BLUE SHIELD | Admitting: Cardiology

## 2015-11-23 VITALS — BP 118/68 | HR 76 | Ht 66.0 in | Wt 162.0 lb

## 2015-11-23 DIAGNOSIS — I10 Essential (primary) hypertension: Secondary | ICD-10-CM | POA: Diagnosis not present

## 2015-11-23 DIAGNOSIS — I5021 Acute systolic (congestive) heart failure: Secondary | ICD-10-CM | POA: Diagnosis not present

## 2015-11-23 DIAGNOSIS — E785 Hyperlipidemia, unspecified: Secondary | ICD-10-CM | POA: Diagnosis not present

## 2015-11-23 DIAGNOSIS — Z955 Presence of coronary angioplasty implant and graft: Secondary | ICD-10-CM

## 2015-11-23 DIAGNOSIS — I214 Non-ST elevation (NSTEMI) myocardial infarction: Secondary | ICD-10-CM | POA: Diagnosis not present

## 2015-11-23 DIAGNOSIS — Z720 Tobacco use: Secondary | ICD-10-CM

## 2015-11-23 DIAGNOSIS — I255 Ischemic cardiomyopathy: Secondary | ICD-10-CM | POA: Insufficient documentation

## 2015-11-23 MED ORDER — LISINOPRIL 5 MG PO TABS: 5.0000 mg | ORAL_TABLET | Freq: Every day | ORAL | Status: DC

## 2015-11-23 NOTE — Addendum Note (Signed)
Addended by: Dossie Arbour on: 11/23/2015 02:48 PM   Modules accepted: Orders

## 2015-11-23 NOTE — Progress Notes (Signed)
Patient ID: Cameron Hoover, male   DOB: [birth year], 60 y.o.   MRN: [id number]      Cardiology Office Note  Date:  11/23/2015   ID:  Cameron Hoover, DOB Jan 28, 1956, MRN [id number]  PCP:  Pcp Not In System  Cardiologist:  Lars Masson, MD   Chief complain: Post hospital follow up   History of Present Illness: Cameron Hoover is a 60 y.o. male with PMH of tobacco abuse, nonobstructive CAD on cath ('95 and '07) who were last seen by Dr. Swaziland in 2007 presented with severe dyspnea, inability to lie flat, recent hemoptysis and minimal chest discomfort. Initially transferred as anterior STEMI, STEMI cancelled on further review. Underwent cardiac cath on 05/28/15 with 80% mid LAD treated with 3.026 mm Resolute DES stent postdilated to 3.26 mm, 25% LM lesion, 10% prox RCA, started on ASA and Brilinta, no BB due to bradycardia. R radial cath site stable, echo on 05/28/2015 showed EF 40-45%, grade 1 diastolic dysfunction, no significant valvular abnormalities. He presented with an acute CHF and was diuresed in the hospital.  Today he denies CP, improved SOB he has residual fatigue, no LE edema, no orthopnea, PND, no bleeding, no muscle pain.  11/23/2015 - 3 months follow-up the patient states he is compliant with his medicines and has no side effects other than blood when he blows his nose no overt nosebleeds. He denies any chest pain. He has stable dyspnea on exertion, but is able to work full time for his caring heavy loads. He denies any muscular pain, no lower extremity edema, no personal nocturnal dyspnea.  Past Medical History  Diagnosis Date  . Coronary artery disease     Nonobstructive by LHC in 2007  . Hyperlipidemia     Past Surgical History  Procedure Laterality Date  . Cardiac catheterization  2007  . Cardiac catheterization N/A 05/28/2015    Procedure: Left Heart Cath and Cors/Grafts Angiography;  Surgeon: Lennette Bihari, MD;  Location[facility] INVASIVE CV LAB;  Service: Cardiovascular;   Laterality: N/A;  . Cardiac catheterization N/A 05/28/2015    Procedure: Coronary Stent Intervention;  Surgeon: Lennette Bihari, MD;  Location: [facility] INVASIVE CV LAB;  Service: Cardiovascular;  Laterality: N/A;   Current Outpatient Prescriptions  Medication Sig Dispense Refill  . aspirin EC 81 MG EC tablet Take 1 tablet (81 mg total) by mouth daily. 30 tablet 0  . furosemide (LASIX) 20 MG tablet Take 1 tablet (20 mg total) by mouth daily. 90 tablet 3  . lisinopril (PRINIVIL,ZESTRIL) 2.5 MG tablet Take 1 tablet (2.5 mg total) by mouth daily. 90 tablet 3  . metoprolol succinate (TOPROL XL) 25 MG 24 hr tablet Take 1 tablet (25 mg total) by mouth daily. 30 tablet 6  . ticagrelor (BRILINTA) 90 MG TABS tablet Take 1 tablet (90 mg total) by mouth 2 (two) times daily. 60 tablet 10  . Vitamin D, Ergocalciferol, (DRISDOL) 50000 units CAPS capsule Take 50,000 Units by mouth once a week.  0  . atorvastatin (LIPITOR) 80 MG tablet Take 1 tablet (80 mg total) by mouth daily at 6 PM. 90 tablet 3   No current facility-administered medications for this visit.   Allergies:   Review of patient's allergies indicates no known allergies.   Social History:  The patient  reports that he has been smoking Cigarettes.  He has a 46 pack-year smoking history. He has never used smokeless tobacco. He reports that he drinks about 8.4 oz of alcohol  per week. He reports that he does not use illicit drugs.   Family History:  The patient's family history includes Cancer in his father and mother; Diabetes in his mother; Emphysema in his father; Heart attack in his brother and brother; Heart attack (age of onset: 82) in his cousin.   ROS:  Please see the history of present illness.   Otherwise, review of systems are positive for none.   All other systems are reviewed and negative.   PHYSICAL EXAM: VS:  BP 118/68 mmHg  Pulse 76  Ht  (1.676 m)  Wt 162 lb (73.483 kg)  BMI 26.16 kg/m2 , BMI Body mass index is 26.16  kg/(m^2). GEN: Well nourished, well developed, in no acute distress HEENT: normal Neck: no JVD, carotid bruits, or masses Cardiac: RRR; no murmurs, rubs, or gallops,no edema  Respiratory:  clear to auscultation bilaterally, normal work of breathing GI: soft, nontender, nondistended, + BS MS: no deformity or atrophy Skin: warm and dry, no rash Neuro:  Strength and sensation are intact Psych: euthymic mood, full affect  EKG:  EKG is not ordered today.  Recent Labs: 05/28/2015: B Natriuretic Peptide 133.1* 08/19/2015: ALT 35; BUN 16; Creat 1.02; Hemoglobin 14.9; Platelets 372; Potassium 3.7; Sodium 137   Lipid Panel No results found for: CHOL, TRIG, HDL, CHOLHDL, VLDL, LDLCALC, LDLDIRECT   Wt Readings from Last 3 Encounters:  11/23/15 162 lb (73.483 kg)  08/31/15 169 lb (76.658 kg)  06/11/15 164 lb (74.39 kg)    Left cardiac cath: 05/28/2015  LM lesion, 25% stenosed.  Prox RCA lesion, 10% stenosed.  Mid LAD lesion, 80% stenosed. Post intervention, there is a 0% residual stenosis.  There is mild left ventricular systolic dysfunction.  Mild LV dysfunction with an ejection fraction of 50% with suggestion of a small region of mid anterolateral and mid inferior hypokinesis.  Coronary obstructive disease with mild 25% stenosis in the distal left main, 80% eccentric stenosis in the proximal to mid LAD, normal ramus intermediate, normal left circumflex, and dominant RCA with smooth 10% proximal narrowing.   Successful PCI to the LAD with ultimate insertion of a 3.026 mm Resolute DES stent postdilated to 3.26 mm with the 80% eccentric stenosis being reduced to 0%. There was TIMI-3 flow and no evidence for dissection.  RECOMMENDATION: The patient should maintain on dual antiplatelet therapy . ideally for up to a year. The patient had a mildly positive d-dimer and if there are still concerns for PE, further evaluation with CT angiogram or a VQ scan should be obtained.  TTE:  05/28/2015 Left ventricle: The cavity size was normal. Wall thickness was increased in a pattern of mild LVH. Systolic function was mildly to moderately reduced. The estimated ejection fraction was in the range of 40% to 45%. Inferolateral, anterolateral, and apical hypokinesis. Doppler parameters are consistent with abnormal left ventricular relaxation (grade 1 diastolic dysfunction). - Aortic valve: There was no stenosis. - Mitral valve: Mildly calcified annulus. There was no significant regurgitation. - Right ventricle: The cavity size was normal. Systolic function was normal. - Pulmonary arteries: No complete TR doppler jet so unable to estimate PA systolic pressure. - Inferior vena cava: The vessel was normal in size. The respirophasic diameter changes were in the normal range (>= 50%), consistent with normal central venous pressure.  Impressions: - Normal LV size with mild LV hypertrophy. EF 40-45% with wall motion abnormalities as noted above. Normal RV size and systolic function. No significant valvular abnormalities.   ASSESSMENT  AND PLAN:  Pt with PMH of tobacco abuse, nonobstructive CAD on cath ('95 and '07) who were last seen by Dr. Sw[place] in 2007 presented with severe dyspnea, inability to lie flat, recent hemoptysis and minimal chest discomfort. Initially transferred as anterior STEMI, STEMI cancelled on further review.  1. NSTEMI - cath 05/28/2015 80% mid LAD treated with 3.026 mm Resolute DES stent postdilated to 3.26 mm, 25% LM lesion, 10% prox RCA - continue ASA and Brilinta, Metoprolol and lisinopril as well as atorvastatin high dose.  2. Ischemic cardiomyopathy -  - echo 05/28/2015 EF 40-45%, grade 1 diastolic dysfunction, no significant valvular abnormalities. - His repeat echocardiogram showed LVEF of 35-40%. We added Toprol-XL we'll recheck his echocardiogram prior to hiss next appointment.  3. HTN  - controlled  4. Acute  on chronic systolic congestive heart failure- discontinues Lasix 20 as his blood pressures borderline and hip is euvolemic and the need to uptitrate his lisinopril considering worsening LVEF. - I will increase lisinopril to 5 mg daily.  5. Hyperlipidemia - On high dose atorvastatin 80 mg daily, we will recheck prior to next appointment.  6. Additional paperwork for insurance coverage for hospital stay feel today total time extra time spent paperwork 15 minutes.  Follow-up in 6 months, CMP, CBC, lipids and repeat echocardiogram prior to next appointment.  Signed, Lars Masson, MD  11/23/2015 1:43 PM    [facility] [address] [place], [place], [place]  [address] Phone: [phone]; Fax: [phone]

## 2015-11-23 NOTE — Patient Instructions (Addendum)
Medication Instructions:  Your physician has recommended you make the following change in your medication:  Stop furosemide Increase lisinopril to 5 mg by mouth daily   Labwork: Your physician recommends that you return for fasting lab work in: about 6 months--week or so prior to appt with Dr. Claudell Kyle, CBC,Lipid,LFT's   Testing/Procedures: Your physician has requested that you have an echocardiogram. Echocardiography is a painless test that uses sound waves to create images of your heart. It provides your doctor with information about the size and shape of your heart and how well your heart's chambers and valves are working. This procedure takes approximately one hour. There are no restrictions for this procedure. To be done in late October 2017.  --week or so prior to appt with Dr. Delton See    Follow-Up: Your physician wants you to follow-up in: 6 months.  You will receive a reminder letter in the mail two months in advance. If you don't receive a letter, please call our office to schedule the follow-up appointment.   Any Other Special Instructions Will Be Listed Below (If Applicable).     If you need a refill on your cardiac medications before your next appointment, please call your pharmacy.

## 2015-11-23 NOTE — Addendum Note (Signed)
Addended by: Dossie Arbour on: 11/23/2015 02:25 PM   Modules accepted: Orders, Medications

## 2016-03-01 DIAGNOSIS — D62 Acute posthemorrhagic anemia: Secondary | ICD-10-CM

## 2016-03-01 DIAGNOSIS — K2970 Gastritis, unspecified, without bleeding: Secondary | ICD-10-CM

## 2016-03-01 DIAGNOSIS — K2980 Duodenitis without bleeding: Secondary | ICD-10-CM

## 2016-03-01 HISTORY — DX: Acute posthemorrhagic anemia: D62

## 2016-03-01 HISTORY — DX: Gastritis, unspecified, without bleeding: K29.70

## 2016-03-01 HISTORY — DX: Duodenitis without bleeding: K29.80

## 2016-03-16 ENCOUNTER — Telehealth: Payer: Self-pay | Admitting: Cardiology

## 2016-03-16 NOTE — Telephone Encounter (Signed)
New Message  Pt expressed he needs a sooner appt due to him having surgery within a few days.  Pt expressed he needs some forms to be filled out prior to his surgery.  Advised pt Delton See is booked up until 11.1 pt is wanting something sooner.  Please follow up with pt. Thanks!

## 2016-03-16 NOTE — Telephone Encounter (Signed)
Pt was simply calling to get an appt with Dr Delton See or a PA that works with her for follow-up and to possibly needing clearance for back surgery in the near future.  Pt states he had an MRI done Monday and it showed some issues with his back which will require for him to see a specialist.  Pt has not seen his back MD yet, and does not have a surgery date set yet.  Pt is scheduled to see Ronie Spies PA-C on 03/30/16, and would like to keep that appt, for follow-up.  Pt also had questions about he insurance company wanting his medical records from our office, about his recent heart condition.  Advised the pt that he must legally sign a release form, before our medical records dept can release his information to his insurance company.  Pt states he will come by this week to sign that release form, and is aware to ask for Selena Batten in medical records.  Will inform Selena Batten of this.  Also advised the pt that when he see's his back MD and they decide the course of treatment for him, if clearance will be required by Cardiology, he should have them fax this information to [phone] attention it to Dr Delton See.  Pt verbalized understanding and agrees with this plan.

## 2016-03-19 ENCOUNTER — Inpatient Hospital Stay (HOSPITAL_COMMUNITY)
Admission: EM | Admit: 2016-03-19 | Discharge: 2016-03-21 | DRG: 378 | Disposition: A | Discharge status: Home / Self Care | Payer: BLUE CROSS/BLUE SHIELD | Attending: Internal Medicine | Admitting: Internal Medicine

## 2016-03-19 ENCOUNTER — Emergency Department (HOSPITAL_COMMUNITY): Payer: BLUE CROSS/BLUE SHIELD

## 2016-03-19 ENCOUNTER — Encounter (HOSPITAL_COMMUNITY): Payer: Self-pay

## 2016-03-19 DIAGNOSIS — K921 Melena: Principal | ICD-10-CM | POA: Diagnosis present

## 2016-03-19 DIAGNOSIS — D62 Acute posthemorrhagic anemia: Secondary | ICD-10-CM | POA: Diagnosis not present

## 2016-03-19 DIAGNOSIS — I10 Essential (primary) hypertension: Secondary | ICD-10-CM | POA: Diagnosis not present

## 2016-03-19 DIAGNOSIS — I5022 Chronic systolic (congestive) heart failure: Secondary | ICD-10-CM

## 2016-03-19 DIAGNOSIS — F17210 Nicotine dependence, cigarettes, uncomplicated: Secondary | ICD-10-CM | POA: Diagnosis present

## 2016-03-19 DIAGNOSIS — Z833 Family history of diabetes mellitus: Secondary | ICD-10-CM

## 2016-03-19 DIAGNOSIS — Z7982 Long term (current) use of aspirin: Secondary | ICD-10-CM

## 2016-03-19 DIAGNOSIS — I5042 Chronic combined systolic (congestive) and diastolic (congestive) heart failure: Secondary | ICD-10-CM

## 2016-03-19 DIAGNOSIS — I252 Old myocardial infarction: Secondary | ICD-10-CM

## 2016-03-19 DIAGNOSIS — I110 Hypertensive heart disease with heart failure: Secondary | ICD-10-CM | POA: Diagnosis present

## 2016-03-19 DIAGNOSIS — Z809 Family history of malignant neoplasm, unspecified: Secondary | ICD-10-CM

## 2016-03-19 DIAGNOSIS — K922 Gastrointestinal hemorrhage, unspecified: Secondary | ICD-10-CM

## 2016-03-19 DIAGNOSIS — E785 Hyperlipidemia, unspecified: Secondary | ICD-10-CM

## 2016-03-19 DIAGNOSIS — Z825 Family history of asthma and other chronic lower respiratory diseases: Secondary | ICD-10-CM

## 2016-03-19 DIAGNOSIS — R1013 Epigastric pain: Secondary | ICD-10-CM | POA: Diagnosis not present

## 2016-03-19 DIAGNOSIS — I255 Ischemic cardiomyopathy: Secondary | ICD-10-CM | POA: Diagnosis present

## 2016-03-19 DIAGNOSIS — K259 Gastric ulcer, unspecified as acute or chronic, without hemorrhage or perforation: Secondary | ICD-10-CM | POA: Diagnosis present

## 2016-03-19 DIAGNOSIS — K2980 Duodenitis without bleeding: Secondary | ICD-10-CM | POA: Diagnosis present

## 2016-03-19 DIAGNOSIS — Z955 Presence of coronary angioplasty implant and graft: Secondary | ICD-10-CM

## 2016-03-19 DIAGNOSIS — I2510 Atherosclerotic heart disease of native coronary artery without angina pectoris: Secondary | ICD-10-CM | POA: Diagnosis present

## 2016-03-19 DIAGNOSIS — K2960 Other gastritis without bleeding: Secondary | ICD-10-CM | POA: Diagnosis present

## 2016-03-19 DIAGNOSIS — Z8249 Family history of ischemic heart disease and other diseases of the circulatory system: Secondary | ICD-10-CM

## 2016-03-19 LAB — CBC
HCT: 30 % — ABNORMAL LOW (ref 39.0–52.0)
HEMOGLOBIN: 10 g/dL — AB (ref 13.0–17.0)
MCH: 32.2 pg (ref 26.0–34.0)
MCHC: 33.3 g/dL (ref 30.0–36.0)
MCV: 96.5 fL (ref 78.0–100.0)
PLATELETS: 441 10*3/uL — AB (ref 150–400)
RBC: 3.11 MIL/uL — AB (ref 4.22–5.81)
RDW: 15.8 % — ABNORMAL HIGH (ref 11.5–15.5)
WBC: 10 10*3/uL (ref 4.0–10.5)

## 2016-03-19 LAB — DIFFERENTIAL
Basophils Absolute: 0 10*3/uL (ref 0.0–0.1)
Basophils Relative: 0 %
Eosinophils Absolute: 0.2 10*3/uL (ref 0.0–0.7)
Eosinophils Relative: 2 %
LYMPHS PCT: 34 %
Lymphs Abs: 3.4 10*3/uL (ref 0.7–4.0)
MONO ABS: 0.5 10*3/uL (ref 0.1–1.0)
MONOS PCT: 5 %
NEUTROS ABS: 5.9 10*3/uL (ref 1.7–7.7)
Neutrophils Relative %: 59 %

## 2016-03-19 LAB — BASIC METABOLIC PANEL
ANION GAP: 5 (ref 5–15)
BUN: 18 mg/dL (ref 6–20)
CALCIUM: 8.6 mg/dL — AB (ref 8.9–10.3)
CO2: 27 mmol/L (ref 22–32)
CREATININE: 0.95 mg/dL (ref 0.61–1.24)
Chloride: 106 mmol/L (ref 101–111)
GLUCOSE: 103 mg/dL — AB (ref 65–99)
Potassium: 4.2 mmol/L (ref 3.5–5.1)
Sodium: 138 mmol/L (ref 135–145)

## 2016-03-19 LAB — HEPATIC FUNCTION PANEL
ALBUMIN: 3.7 g/dL (ref 3.5–5.0)
ALT: 33 U/L (ref 17–63)
AST: 22 U/L (ref 15–41)
Alkaline Phosphatase: 89 U/L (ref 38–126)
Bilirubin, Direct: <0.1 mg/dL — ABNORMAL LOW (ref 0.1–0.5)
TOTAL PROTEIN: 6.9 g/dL (ref 6.5–8.1)
Total Bilirubin: 0.2 mg/dL — ABNORMAL LOW (ref 0.3–1.2)

## 2016-03-19 LAB — TROPONIN I
Troponin I: <0.03 ng/mL (ref <0.03)
Troponin I: <0.03 ng/mL (ref <0.03)

## 2016-03-19 LAB — POC OCCULT BLOOD, ED: FECAL OCCULT BLD: POSITIVE — AB

## 2016-03-19 LAB — HEMOGLOBIN AND HEMATOCRIT, BLOOD
HEMATOCRIT: 29 % — AB (ref 39.0–52.0)
Hemoglobin: 9.7 g/dL — ABNORMAL LOW (ref 13.0–17.0)

## 2016-03-19 LAB — I-STAT TROPONIN, ED: TROPONIN I, POC: 0.01 ng/mL (ref 0.00–0.08)

## 2016-03-19 LAB — LIPASE, BLOOD: Lipase: 33 U/L (ref 11–51)

## 2016-03-19 LAB — HEMOGLOBIN: Hemoglobin: 9.1 g/dL — ABNORMAL LOW (ref 13.0–17.0)

## 2016-03-19 LAB — ABO/RH: ABO/RH(D): B POS

## 2016-03-19 MED ORDER — OXYCODONE-ACETAMINOPHEN 5-325 MG PO TABS
1.0000 | ORAL_TABLET | Freq: Four times a day (QID) | ORAL | Status: DC | PRN
Start: 2016-03-19 — End: 2016-03-21
  Administered 2016-03-19 – 2016-03-20 (×3): 2 via ORAL
  Filled 2016-03-19 (×3): qty 2

## 2016-03-19 MED ORDER — PANTOPRAZOLE SODIUM 40 MG PO TBEC: 40.0000 mg | DELAYED_RELEASE_TABLET | Freq: Two times a day (BID) | ORAL | Status: DC

## 2016-03-19 MED ORDER — ONDANSETRON HCL 4 MG PO TABS: 4.0000 mg | ORAL_TABLET | Freq: Four times a day (QID) | ORAL | Status: DC | PRN

## 2016-03-19 MED ORDER — ASPIRIN 81 MG PO CHEW
CHEWABLE_TABLET | ORAL | Status: AC
  Administered 2016-03-19: 324 mg
  Filled 2016-03-19: qty 4

## 2016-03-19 MED ORDER — GI COCKTAIL ~~LOC~~
ORAL | Status: AC
  Filled 2016-03-19: qty 30

## 2016-03-19 MED ORDER — SODIUM CHLORIDE 0.9 % IV SOLN: INTRAVENOUS | Status: DC

## 2016-03-19 MED ORDER — METOPROLOL SUCCINATE ER 25 MG PO TB24
25.0000 mg | ORAL_TABLET | Freq: Every day | ORAL | Status: DC
  Filled 2016-03-19: qty 1

## 2016-03-19 MED ORDER — LISINOPRIL 5 MG PO TABS
5.0000 mg | ORAL_TABLET | Freq: Every day | ORAL | Status: DC
  Filled 2016-03-19: qty 1

## 2016-03-19 MED ORDER — SODIUM CHLORIDE 0.9 % IV SOLN
Freq: Once | INTRAVENOUS | Status: AC
  Administered 2016-03-19: 15:00:00 via INTRAVENOUS

## 2016-03-19 MED ORDER — SODIUM CHLORIDE 0.9 % IV SOLN
80.0000 mg | Freq: Once | INTRAVENOUS | Status: AC
  Administered 2016-03-19: 80 mg via INTRAVENOUS
  Filled 2016-03-19: qty 80

## 2016-03-19 MED ORDER — ATORVASTATIN CALCIUM 40 MG PO TABS
80.0000 mg | ORAL_TABLET | Freq: Every day | ORAL | Status: DC
  Administered 2016-03-19 – 2016-03-20 (×2): 80 mg via ORAL
  Filled 2016-03-19 (×2): qty 2

## 2016-03-19 MED ORDER — GI COCKTAIL ~~LOC~~
30.0000 mL | Freq: Once | ORAL | Status: AC
  Administered 2016-03-19: 30 mL via ORAL
  Filled 2016-03-19: qty 30

## 2016-03-19 MED ORDER — ONDANSETRON HCL 4 MG/2ML IJ SOLN: 4.0000 mg | Freq: Four times a day (QID) | INTRAMUSCULAR | Status: DC | PRN

## 2016-03-19 MED ORDER — METOPROLOL TARTRATE 5 MG/5ML IV SOLN
5.0000 mg | Freq: Four times a day (QID) | INTRAVENOUS | Status: DC
  Administered 2016-03-19: 5 mg via INTRAVENOUS
  Filled 2016-03-19: qty 5

## 2016-03-19 MED ORDER — MORPHINE SULFATE (PF) 2 MG/ML IV SOLN: 2.0000 mg | INTRAVENOUS | Status: DC | PRN

## 2016-03-19 MED ORDER — SODIUM CHLORIDE 0.9% FLUSH
3.0000 mL | Freq: Two times a day (BID) | INTRAVENOUS | Status: DC
  Administered 2016-03-19 – 2016-03-21 (×3): 3 mL via INTRAVENOUS

## 2016-03-19 MED ORDER — SODIUM CHLORIDE 0.9 % IV SOLN
8.0000 mg/h | INTRAVENOUS | Status: DC
  Administered 2016-03-19 – 2016-03-20 (×4): 8 mg/h via INTRAVENOUS
  Filled 2016-03-19 (×6): qty 80

## 2016-03-19 MED ORDER — GI COCKTAIL ~~LOC~~: 30.0000 mL | Freq: Four times a day (QID) | ORAL | Status: DC | PRN

## 2016-03-19 MED ORDER — OXYCODONE-ACETAMINOPHEN 5-325 MG PO TABS: 1.0000 | ORAL_TABLET | Freq: Four times a day (QID) | ORAL | Status: DC | PRN

## 2016-03-19 MED ORDER — ACETAMINOPHEN 325 MG PO TABS: 650.0000 mg | ORAL_TABLET | Freq: Four times a day (QID) | ORAL | Status: DC | PRN

## 2016-03-19 MED ORDER — PANTOPRAZOLE SODIUM 40 MG PO TBEC
40.0000 mg | DELAYED_RELEASE_TABLET | Freq: Once | ORAL | Status: AC
  Administered 2016-03-19: 40 mg via ORAL
  Filled 2016-03-19: qty 1

## 2016-03-19 MED ORDER — DEXTROSE-NACL 5-0.9 % IV SOLN
INTRAVENOUS | Status: DC
  Administered 2016-03-19 – 2016-03-20 (×3): via INTRAVENOUS

## 2016-03-19 MED ORDER — ACETAMINOPHEN 650 MG RE SUPP: 650.0000 mg | Freq: Four times a day (QID) | RECTAL | Status: DC | PRN

## 2016-03-19 MED ORDER — ENALAPRILAT 1.25 MG/ML IV SOLN
0.6250 mg | Freq: Four times a day (QID) | INTRAVENOUS | Status: DC
  Administered 2016-03-19: 0.625 mg via INTRAVENOUS
  Filled 2016-03-19: qty 2

## 2016-03-19 MED ORDER — NITROGLYCERIN 0.4 MG SL SUBL
SUBLINGUAL_TABLET | SUBLINGUAL | Status: AC
  Filled 2016-03-19: qty 1

## 2016-03-19 NOTE — Consult Note (Addendum)
Referring Provider: No ref. provider found Primary Care Physician:  Pcp Not In System Primary Gastroenterologist:  Jonette EvaSandi Aubreigh Fuerte  Reason for Consultation:  ANEMIA/MELENA   Impression: ADMITTED WITH MELENA/EPIGASTRIC PAIN AND DROP IN Hb WHILE TAKING ASA/BRILINTA. DIFFERENTIAL DIAGNOSIS INCLUDES: PUD, AVMs, DIEULAFOY'S LESION, LESS LIKELY, GE JUNCTION TUMOR, GASTRIC CA.  Plan: 1. SPOKE WITH PHARMACY(LAURA). BRILINTA SHOULD BE HELD FOR  5 DAYS FOR SURGERY. NO DEFINITE GUIDELINES FOR ENDOSCOPY. FELT 48 HR WAITING PERIOD AFTER LAST DOSE BRILINTA SHOULD BE SUFFICIENT TO PERFORM ENDOSCOPY. 2. HOLD BRILINTA-RISK OF BLEEDING OUTWEIGH RISKS OF STENT OCCLUSION AFTER 6 MOS. WEIGHT RISK V. BENEFIT OF CONTINUING ASA AFTER EGD.DISCUSSED WITH DR. ALLRED.  3. PROTONIX GTT 4. SUPPORTIVE CARE-TRANSFUSE PRN 5. FULL LIQUID DIET. PLAN EGD MON @ 0730 UNLESS PT DEVELOPS SIGNS OR SYMPTOMS OF ONGOING GI BLEED. DISCUSSED PROCEDURE, BENEFITS, & RISKS: < 1% chance of medication reaction, perforartion, OR bleeding.  GREATER THAN 50% WAS SPENT IN COUNSELING & COORDINATION OF CARE WITH THE PATIENT: DISCUSSED THE CHALLENGES, DIFFERENTIAL DIAGNOSIS, PROCEDURE, BENEFITS, RISKS, AND MANAGEMENT/CARE PLAN OF GI BLEED IN PT WITH CAD ON ANTIPLATELET THERAPY. TOTAL ENCOUNTER TIME: 80 MINS.     HPI:  HAD AMI/DES STENT PLACED OCT 2016.  PLACED ON ASA/BRILINTA. DOING OK. LAST SAW HEART JUL 2017. NOT TAKING A PPI. STARTED SEEING BLACK STOOLS LAST WEEK. TCS 2 WEEKS-AUG 7-DR. MAGOD. STARTED BACK ON THE BRILINTA THE EVENING OF TCS. POLYPS WERE REMOVED. TROUBLE WITH NAUSEA-EVERY DAY, BUT NO VOMITING. STOMCH HURTING AT THE TOP AND IN THE CENTER FOR PAST WEEK. SHARP AT TIMES. LAST BLACK STOOL: YESTERDAY. LAST TIME ASA/BRILINTA YESTERDAY MORNING. BEFORE TCS N PROBLEMS CONSTIPATION OR DIARRHEA. TROUBLE SWALLOWING PILLS A COUPLE DAYS AGO. WEIGHT LOSS: ~30 LBS SINCE FEB 2017 WHEN HE WENT BACK TO WORK. LIFTS HEAVY CASES DAILY. BEEN FEELING MORE  SOB FOR PAST WEEK OR TWO. NOT HARD TO SEDATE. NO POSTANESTHESIA NV. Last BLACK STOOL YESTERDAY MORNING.   PT DENIES FEVER, CHILLS, HEMATOCHEZIA, HEMATEMESIS, melena, diarrhea, CHEST PAIN, SHORTNESS OF BREATH, CHANGE IN BOWEL IN HABITS, constipation, problems swallowing, problems with sedation, OR heartburn or indigestion.   Past Medical History:  Diagnosis Date  . Coronary artery disease    Nonobstructive by LHC in 2007  . Hyperlipidemia    Past Surgical History:  Procedure Laterality Date  . CARDIAC CATHETERIZATION  2007  . CARDIAC CATHETERIZATION N/A 05/28/2015   Procedure: Left Heart Cath and Cors/Grafts Angiography;  Surgeon: Lennette Biharihomas A Kelly, MD;  [facility] INVASIVE CV LAB;  Service: Cardiovascular;  Laterality: N/A;  . CARDIAC CATHETERIZATION N/A 05/28/2015   Procedure: Coronary Stent Intervention;  Surgeon: Lennette Biharihomas A Kelly, MD;  Location: [facility] INVASIVE CV LAB;  Service: Cardiovascular;  Laterality: N/A;    Prior to Admission medications   Medication Sig Start Date End Date Taking? Authorizing Provider  aspirin EC 81 MG EC tablet Take 1 tablet (81 mg total) by mouth daily.      atorvastatin (LIPITOR) 80 MG tablet Take 1 tablet (80 mg total) by mouth daily at 6 PM.      lisinopril (PRINIVIL,ZESTRIL) 5 MG tablet Take 1 tablet (5 mg total) by mouth daily.      metoprolol succinate (TOPROL XL) 25 MG 24 hr tablet Take 1 tablet (25 mg total) by mouth daily.      oxyCODONE-acetaminophen (PERCOCET/ROXICET) 5-325 MG tablet Take by mouth every 4 (four) hours as needed for severe pain.      ticagrelor (BRILINTA) 90 MG TABS tablet Take 1 tablet (90 mg total)  by mouth 2 (two) times daily.      Vitamin D, Ergocalciferol, (DRISDOL) 50000 units CAPS capsule Take 50,000 Units by mouth once a week. Takes on Monday        Current Facility-Administered Medications  Medication Dose Route Frequency Provider Last Rate Last Dose  . 0.9 %  sodium chloride infusion   Intravenous STAT Dione Booze, MD       . acetaminophen (TYLENOL) tablet 650 mg  650 mg Oral Q6H PRN Jerald Kief, MD       Or  . acetaminophen (TYLENOL) suppository 650 mg  650 mg Rectal Q6H PRN Jerald Kief, MD      . dextrose 5 %-0.9 % sodium chloride infusion   Intravenous Continuous Jerald Kief, MD      . enalaprilat (VASOTEC) injection 0.625 mg  0.625 mg Intravenous Q6H Jerald Kief, MD      . gi cocktail (Maalox,Lidocaine,Donnatal)  30 mL Oral QID PRN Jerald Kief, MD      . gi cocktail suspension           . metoprolol (LOPRESSOR) injection 5 mg  5 mg Intravenous Q6H Jerald Kief, MD      . nitroGLYCERIN (NITROSTAT) 0.4 MG SL tablet           . ondansetron (ZOFRAN) tablet 4 mg  4 mg Oral Q6H PRN Jerald Kief, MD       Or  . ondansetron [facility]) injection 4 mg  4 mg Intravenous Q6H PRN Jerald Kief, MD      . pantoprazole (PROTONIX) 80 mg in sodium chloride 0.9 % 250 mL (0.32 mg/mL) infusion  8 mg/hr Intravenous Continuous Jerald Kief, MD    . sodium chloride flush (NS) 0.9 % injection 3 mL  3 mL Intravenous Q12H Jerald Kief, MD      Allergies as of 03/19/2016  . (No Known Allergies)    Family History  Problem Relation Age of Onset  . Cancer Father   . Emphysema Father   . Heart attack Brother   . Heart attack Brother   . Cancer Mother   . Diabetes Mother   . Heart attack Cousin 40     Social History   Social History  . Marital status: Legally Separated    Spouse name: N/A  . Number of children: N/A  . Years of education: N/A   Occupational History  . Not on file.   Social History Main Topics  . Smoking status: Current Every Day Smoker    Packs/day: 1.00    Years: 46.00    Types: Cigarettes  . Smokeless tobacco: Never Used  . Alcohol use 8.4 oz/week    14 Cans of beer per week  . Drug use: No  . Sexual activity: Not on file   Other Topics Concern  . Not on file   Social History Narrative  . No narrative on file    Review of Systems: PER HPI OTHERWISE ALL SYSTEMS  ARE NEGATIVE.  Vitals: Blood pressure 125/61, pulse 65, temperature 98.3 F (36.8 C), temperature source Oral, resp. rate 18, height 5\' 6"  (1.676 m), weight 158 lb (71.7 kg), SpO2 99 %.  Physical Exam: General:   Alert,  Well-developed, well-nourished, pleasant and cooperative in NAD Head:  Normocephalic and atraumatic. Eyes:  Sclera clear, no icterus.   Conjunctiva pink. Mouth:  No lesions, dentition ABnormal. Neck:  Supple; no masses. Lungs:  Clear throughout to auscultation.  No wheezes. No acute distress. Heart:  Regular rate and rhythm; no murmurs. Abdomen:  Soft, MILDLY tender INEPIGASTRIUM and nondistended. No masses noted. Normal bowel sounds, without guarding, and without rebound.   Msk:  Symmetrical without gross deformities. Normal posture. Extremities:  Without edema. Neurologic:  Alert and  oriented x4;  grossly normal neurologically. Cervical Nodes:  No significant cervical adenopathy. Psych:  Alert and cooperative. Normal mood and FLAT affect.   Lab Results:  Recent Labs  03/19/16 0550 03/19/16 1000  WBC 10.0  --   HGB 10.0* 9.1*  HCT 30.0*  --   PLT 441*  --    BMET  Recent Labs  03/19/16 0550  NA 138  K 4.2  CL 106  CO2 27  GLUCOSE 103*  BUN 18  CREATININE 0.95  CALCIUM 8.6*   LFT  Recent Labs  03/19/16 0550  PROT 6.9  ALBUMIN 3.7  AST 22  ALT 33  ALKPHOS 89  BILITOT 0.2*  BILIDIR <0.1*  IBILI NOT CALCULATED     Studies/Results: AUG 19 CXR PA/LAT-BRONCHITIS   LOS: 0 days   Key Cen  03/19/2016, 11:10 AM

## 2016-03-19 NOTE — ED Notes (Signed)
Pt states his stomach is hurting real bad.  Pt refused GI cocktail.  Getting protonix drip from pharmacy.

## 2016-03-19 NOTE — ED Triage Notes (Signed)
Pt states he awoke this am with pain to the whole left side of his chest.

## 2016-03-19 NOTE — ED Provider Notes (Signed)
[facility]-EMERGENCY DEPT Provider Note   CSN: [id number] Arrival date & time: 03/19/16  0544     History   Chief Complaint Chief Complaint  Patient presents with  . Chest Pain    HPI Cameron Hoover is a 60 y.o. male.  The history is provided by the patient.  He has a history of coronary artery disease with stent placement, hypertension, ischemic cardiomyopathy he was awakened around 45 minutes ago my pain in the left subcostal area which radiated up to the chest and into the shoulder. He is a difficult historian and cannot really explain the pain or give it a number. It is subsiding. He denies dyspnea, nausea, diaphoresis. Nothing made it better nothing made it worse. He did have a stent placed last October and is on dual antiplatelet therapy and claims compliance with that regimen.  Past Medical History:  Diagnosis Date  . Coronary artery disease    Nonobstructive by LHC in 2007  . Hyperlipidemia     Patient Active Problem List   Diagnosis Date Noted  . Essential hypertension 11/23/2015  . Cardiomyopathy, ischemic 11/23/2015  . Acute systolic congestive heart failure, NYHA class 2 (HCC) 05/30/2015  . Stented coronary artery   . Hyperlipidemia 05/28/2015  . NSTEMI (non-ST elevated myocardial infarction) (HCC) 05/28/2015  . Acute respiratory failure with hypoxia (HCC) 05/28/2015  . Tobacco abuse 05/28/2015  . Leukocytosis 05/28/2015  . Angina pectoris (HCC) 05/28/2015  . Acute pulmonary edema [facility])     Past Surgical History:  Procedure Laterality Date  . CARDIAC CATHETERIZATION  2007  . CARDIAC CATHETERIZATION N/A 05/28/2015   Procedure: Left Heart Cath and Cors/Grafts Angiography;  Surgeon: Lennette Bihari, MD;  Location: [facility] INVASIVE CV LAB;  Service: Cardiovascular;  Laterality: N/A;  . CARDIAC CATHETERIZATION N/A 05/28/2015   Procedure: Coronary Stent Intervention;  Surgeon: Lennette Bihari, MD;  Location: [facility] INVASIVE CV LAB;  Service: Cardiovascular;  Laterality: N/A;        Home Medications    Prior to Admission medications   Medication Sig Start Date End Date Taking? Authorizing Provider  aspirin EC 81 MG EC tablet Take 1 tablet (81 mg total) by mouth daily. 05/30/15   Catarina Hartshorn, MD  atorvastatin (LIPITOR) 80 MG tablet Take 1 tablet (80 mg total) by mouth daily at 6 PM. 07/01/15   Lars Masson, MD  lisinopril (PRINIVIL,ZESTRIL) 5 MG tablet Take 1 tablet (5 mg total) by mouth daily. 11/23/15   Lars Masson, MD  metoprolol succinate (TOPROL XL) 25 MG 24 hr tablet Take 1 tablet (25 mg total) by mouth daily. 08/31/15   Lars Masson, MD  ticagrelor (BRILINTA) 90 MG TABS tablet Take 1 tablet (90 mg total) by mouth 2 (two) times daily. 07/07/15   Lars Masson, MD  Vitamin D, Ergocalciferol, (DRISDOL) 50000 units CAPS capsule Take 50,000 Units by mouth once a week. 11/13/15   Historical Provider, MD    Family History Family History  Problem Relation Age of Onset  . Cancer Mother   . Diabetes Mother   . Cancer Father   . Emphysema Father   . Heart attack Brother   . Heart attack Brother   . Heart attack Cousin 61    Social History Social History  Substance Use Topics  . Smoking status: Current Every Day Smoker    Packs/day: 1.00    Years: 46.00    Types: Cigarettes  . Smokeless tobacco: Never Used  . Alcohol use 8.4 oz/week  14 Cans of beer per week     Allergies   Review of patient's allergies indicates no known allergies.   Review of Systems Review of Systems  All other systems reviewed and are negative.    Physical Exam Updated Vital Signs BP 134/80 (BP Location: Left Arm)   Pulse 70   Temp 98 F (36.7 C) (Oral)   Resp 18   Ht 5\' 6"  (1.676 m)   Wt 158 lb (71.7 kg)   SpO2 100%   BMI 25.50 kg/m   Physical Exam  Nursing note and vitals reviewed.  60 year old male, resting comfortably and in no acute distress. Vital signs are normal. Oxygen saturation is 100%, which is normal. Head is normocephalic and  atraumatic. PERRLA, EOMI. Oropharynx is clear. Neck is nontender and supple without adenopathy or JVD. Back is nontender and there is no CVA tenderness. Lungs are clear without rales, wheezes, or rhonchi. Chest is nontender. Heart has regular rate and rhythm with 1-2/6 systolic ejection murmur best heard at the lower left sternal border. Abdomen is soft, flat, with moderate epigastric tenderness. There are no masses or hepatosplenomegaly and peristalsis is normoactive. Extremities have no cyanosis or edema, full range of motion is present. Skin is warm and dry without rash. Neurologic: Mental status is normal, cranial nerves are intact, there are no motor or sensory deficits.  ED Treatments / Results  Labs (all labs ordered are listed, but only abnormal results are displayed) Labs Reviewed  BASIC METABOLIC PANEL - Abnormal; Notable for the following:       Result Value   Glucose, Bld 103 (*)    Calcium 8.6 (*)    All other components within normal limits  CBC - Abnormal; Notable for the following:    RBC 3.11 (*)    Hemoglobin 10.0 (*)    HCT 30.0 (*)    RDW 15.8 (*)    Platelets 441 (*)    All other components within normal limits  HEPATIC FUNCTION PANEL - Abnormal; Notable for the following:    Total Bilirubin 0.2 (*)    Bilirubin, Direct <0.1 (*)    All other components within normal limits  POC OCCULT BLOOD, ED - Abnormal; Notable for the following:    Fecal Occult Bld POSITIVE (*)    All other components within normal limits  LIPASE, BLOOD  TROPONIN I  DIFFERENTIAL  I-STAT TROPOININ, ED    EKG  EKG Interpretation  Date/Time:  Saturday March 19 2016 05:47:20 EDT Ventricular Rate:  62 PR Interval:    QRS Duration: 114 QT Interval:  408 QTC Calculation: 415 R Axis:   73 Text Interpretation:  Sinus rhythm Borderline intraventricular conduction delay Borderline T wave abnormalities ST elevation, consider anterior injury Baseline wander in lead(s) V5 When compared  with ECG of 05/30/2015, T wave inversion is no longer Present Confirmed by [facility]  MD, Kiya Eno ([id number]) on 03/19/2016 5:50:58 AM       Radiology Dg Chest 2 View  Result Date: 03/19/2016 CLINICAL DATA:  Left-sided chest pain EXAM: CHEST  2 VIEW COMPARISON:  05/28/2015 FINDINGS: Normal heart size and mediastinal contours. Bronchitic markings which may be related to patient's smoking history. No acute infiltrate or edema. No effusion or pneumothorax. No acute osseous findings. IMPRESSION: 1. No acute finding. 2. Bronchitic markings. Electronically Signed   By: Marnee Spring M.D.   On: 03/19/2016 06:51    Procedures Procedures (including critical care time)  Medications Ordered in ED Medications - No  data to display   Initial Impression / Assessment and Plan / ED Course  I have reviewed the triage vital signs and the nursing notes.  Pertinent labs & imaging results that were available during my care of the patient were reviewed by me and considered in my medical decision making (see chart for details).  Clinical Course   Patient with known coronary artery disease status post and placement presenting with pain in the upper abdomen and chest. ECG shows progression of myocardial infarction with normalization of T-wave inversion, but no acute changes. At this point, GI pain seems more likely. Old records are reviewed, and he had a non-STEMI last October. He had a stent placed in the LAD and had no other significant stenosis. He has been on dual antiplatelet therapy since then, so there is a low risk of acute coronary syndrome. He added that his stools of been dark recently. We'll check stool Hemoccult. He is given a therapeutic trial of GI cocktail.  He had moderate relief of discomfort with GI cocktail. Hemoglobin shows a 5 g drop from values from 7 months ago. Orthostatic vital signs are obtained showing no significant change in pulse or blood pressure. However, with his cardiac history and  significant drop in hemoglobin, it is felt that he would benefit from observation to make sure hemoglobin is not dropping further and to monitor for ischemic changes. Case is discussed with Dr. Rhona Leavenshiu of [facility] agrees to admit the patient under observation status. He will get serial hemoglobins and serial troponins. GI consultation will be obtained.  Final Clinical Impressions(s) / ED Diagnoses   Final diagnoses:  Upper gastrointestinal bleeding  Epigastric pain    New Prescriptions New Prescriptions   No medications on file     Dione Boozeavid Kate Sweetman, MD 03/19/16 [phone]

## 2016-03-19 NOTE — ED Notes (Signed)
Pt resting quietly.  Easily arrouses.  Denies any  Pain.

## 2016-03-19 NOTE — H&P (Signed)
History and Physical    Cameron Hoover QGB:[id number] DOB: 1955-11-09 DOA: 03/19/2016  PCP: Pcp Not In System  Patient coming from: Home  Chief Complaint: Chest pain  HPI: Cameron Hoover is a 60 y.o. male with medical history significant of CAD status post stent placement, most recent cath was in October 2016. Patient also has a history of hyperlipidemia and hypertension. Patient presented to the emergency department with acute onset "chest pain" which awoke patient on the early morning of hospital admission. On further questioning, patient reported "on and off" epigastric pains over the past week associated with black stools. Patient also reports bouts of sob with exertion over the past week. No BRBPR. Of note, patient is s/p screening colonoscopy by Dr. Ewing Schlein recently and per pt, findings only of some polyps.  ED Course: In the emergency department, patient was noted to have stools that were positive for blood. Hemoglobin was noted to be around 10 (baseline hemoglobin around 14). Initial troponin was negative. Patient was given GI cocktail as well as PO Protonix 1. Hospitalist service consulted for consideration for admission  Review of Systems:  Review of Systems  Constitutional: Negative for chills and fever.  HENT: Negative for hearing loss and tinnitus.   Eyes: Negative for blurred vision and double vision.  Respiratory: Negative for sputum production and wheezing.   Cardiovascular: Negative for palpitations and claudication.  Gastrointestinal: Positive for abdominal pain, melena and nausea. Negative for constipation and vomiting.  Genitourinary: Negative for frequency and urgency.  Musculoskeletal: Negative for back pain and falls.  Neurological: Negative for tremors, sensory change and loss of consciousness.  Endo/Heme/Allergies: Negative for polydipsia. Does not bruise/bleed easily.  Psychiatric/Behavioral: Negative for hallucinations and memory loss.    Past Medical History:    Diagnosis Date  . Coronary artery disease    Nonobstructive by LHC in 2007  . Hyperlipidemia     Past Surgical History:  Procedure Laterality Date  . CARDIAC CATHETERIZATION  2007  . CARDIAC CATHETERIZATION N/A 05/28/2015   Procedure: Left Heart Cath and Cors/Grafts Angiography;  Surgeon: Lennette Bihari, MD;  Location: [facility] INVASIVE CV LAB;  Service: Cardiovascular;  Laterality: N/A;  . CARDIAC CATHETERIZATION N/A 05/28/2015   Procedure: Coronary Stent Intervention;  Surgeon: Lennette Bihari, MD;  Location: [facility] INVASIVE CV LAB;  Service: Cardiovascular;  Laterality: N/A;     reports that he has been smoking Cigarettes.  He has a 46.00 pack-year smoking history. He has never used smokeless tobacco. He reports that he drinks about 8.4 oz of alcohol per week . He reports that he does not use drugs.  No Known Allergies  Family History  Problem Relation Age of Onset  . Cancer Father   . Emphysema Father   . Heart attack Brother   . Heart attack Brother   . Cancer Mother   . Diabetes Mother   . Heart attack Cousin 40    Prior to Admission medications   Medication Sig Start Date End Date Taking? Authorizing Provider  aspirin EC 81 MG EC tablet Take 1 tablet (81 mg total) by mouth daily. 05/30/15  Yes Catarina Hartshorn, MD  atorvastatin (LIPITOR) 80 MG tablet Take 1 tablet (80 mg total) by mouth daily at 6 PM. 07/01/15  Yes Lars Masson, MD  lisinopril (PRINIVIL,ZESTRIL) 5 MG tablet Take 1 tablet (5 mg total) by mouth daily. 11/23/15  Yes Lars Masson, MD  metoprolol succinate (TOPROL XL) 25 MG 24 hr tablet Take 1 tablet (25  mg total) by mouth daily. 08/31/15  Yes Lars MassonKatarina H Nelson, MD  oxyCODONE-acetaminophen (PERCOCET/ROXICET) 5-325 MG tablet Take by mouth every 4 (four) hours as needed for severe pain.   Yes Historical Provider, MD  ticagrelor (BRILINTA) 90 MG TABS tablet Take 1 tablet (90 mg total) by mouth 2 (two) times daily. 07/07/15  Yes Lars MassonKatarina H Nelson, MD  Vitamin D,  Ergocalciferol, (DRISDOL) 50000 units CAPS capsule Take 50,000 Units by mouth once a week. 11/13/15  Yes Historical Provider, MD    Physical Exam: Vitals:   03/19/16 0600 03/19/16 0601 03/19/16 0630  BP: 134/70  134/77  Pulse: 66  60  Resp: 13  21  Temp: 98 F (36.7 C)    TempSrc: Oral    SpO2: 100%  100%  Weight:  71.7 kg (158 lb)   Height:  5\' 6"  (1.676 m)     Constitutional: NAD, calm, comfortable Vitals:   03/19/16 0600 03/19/16 0601 03/19/16 0630  BP: 134/70  134/77  Pulse: 66  60  Resp: 13  21  Temp: 98 F (36.7 C)    TempSrc: Oral    SpO2: 100%  100%  Weight:  71.7 kg (158 lb)   Height:  5\' 6"  (1.676 m)    Eyes: PERRL, lids and conjunctivae normal ENMT: Mucous membranes are moist. Posterior pharynx clear of any exudate or lesions.Normal dentition.  Neck: normal, supple, no masses, no thyromegaly Respiratory: clear to auscultation bilaterally, no wheezing, no crackles. Normal respiratory effort. No accessory muscle use.  Cardiovascular: Regular rate and rhythm, no murmurs / rubs / gallops. No extremity edema. 2+ pedal pulses. No carotid bruits.  Abdomen: no tenderness, no masses palpated. No hepatosplenomegaly. Bowel sounds positive.  Musculoskeletal: no clubbing / cyanosis. No joint deformity upper and lower extremities. Good ROM, no contractures. Normal muscle tone.  Skin: no rashes, lesions, ulcers. No induration Neurologic: CN 2-12 grossly intact. Sensation intact, DTR normal. Strength 5/5 in all 4.  Psychiatric: Normal judgment and insight. Alert and oriented x 3. Normal mood.    Labs on Admission: I have personally reviewed following labs and imaging studies  CBC:  Recent Labs Lab 03/19/16 0550  WBC 10.0  NEUTROABS 5.9  HGB 10.0*  HCT 30.0*  MCV 96.5  PLT 441*   Basic Metabolic Panel:  Recent Labs Lab 03/19/16 0550  NA 138  K 4.2  CL 106  CO2 27  GLUCOSE 103*  BUN 18  CREATININE 0.95  CALCIUM 8.6*   GFR: Estimated Creatinine Clearance:  75.6 mL/min (by C-G formula based on SCr of 0.95 mg/dL). Liver Function Tests:  Recent Labs Lab 03/19/16 0550  AST 22  ALT 33  ALKPHOS 89  BILITOT 0.2*  PROT 6.9  ALBUMIN 3.7    Recent Labs Lab 03/19/16 0550  LIPASE 33   No results for input(s): AMMONIA in the last 168 hours. Coagulation Profile: No results for input(s): INR, PROTIME in the last 168 hours. Cardiac Enzymes:  Recent Labs Lab 03/19/16 0550  TROPONINI <0.03   BNP (last 3 results) No results for input(s): PROBNP in the last 8760 hours. HbA1C: No results for input(s): HGBA1C in the last 72 hours. CBG: No results for input(s): GLUCAP in the last 168 hours. Lipid Profile: No results for input(s): CHOL, HDL, LDLCALC, TRIG, CHOLHDL, LDLDIRECT in the last 72 hours. Thyroid Function Tests: No results for input(s): TSH, T4TOTAL, FREET4, T3FREE, THYROIDAB in the last 72 hours. Anemia Panel: No results for input(s): VITAMINB12, FOLATE, FERRITIN, TIBC, IRON, RETICCTPCT  in the last 72 hours. Urine analysis: No results found for: COLORURINE, APPEARANCEUR, LABSPEC, PHURINE, GLUCOSEU, HGBUR, BILIRUBINUR, KETONESUR, PROTEINUR, UROBILINOGEN, NITRITE, LEUKOCYTESUR Sepsis Labs: !!!!!!!!!!!!!!!!!!!!!!!!!!!!!!!!!!!!!!!!!!!! @LABRCNTIP (procalcitonin:4,lacticidven:4) )No results found for this or any previous visit (from the past 240 hour(s)).   Radiological Exams on Admission: Dg Chest 2 View  Result Date: 03/19/2016 CLINICAL DATA:  Left-sided chest pain EXAM: CHEST  2 VIEW COMPARISON:  05/28/2015 FINDINGS: Normal heart size and mediastinal contours. Bronchitic markings which may be related to patient's smoking history. No acute infiltrate or edema. No effusion or pneumothorax. No acute osseous findings. IMPRESSION: 1. No acute finding. 2. Bronchitic markings. Electronically Signed   By: Marnee Spring M.D.   On: 03/19/2016 06:51    Assessment/Plan Principal Problem:   Upper gastrointestinal bleeding Active Problems:    Hyperlipidemia   Stented coronary artery   Essential hypertension   Chronic systolic (congestive) heart failure (HCC)   Acute blood loss anemia   Upper GI bleed   1. Acute blood loss anemia secondary to presumed upper GI bleeding 1. Patient presents with epigastric pain, found to have hemoglobin of 10 (baseline hemoglobin around 14) 2. Patient reported black stools over the past week, Hemoccult was positive and ER 3. Presently hemodynamically stable 4. Patient is on aspirin and brilinta per below. Will hold for the time being 5. [facility] consult gastroenterology for any further recommendations at this time 6. Continue patient on IV PPI 2. History of CAD status post stenting 1. Status post cath and stenting, most recent in October 2016 2. Patient had been continued on aspirin prior to admission 3. We'll hold antiplatelets/anticoagulants (ASA and brilinta) for the time being per above 4. Will cycle troponins area thus far, initial troponin is negative. 3. Hypertension 1. Blood pressure currently stable 2. Continue to monitor 3. Keep on IV ACEI and IV BB while NPO 4. Chronic CHF 1. Most recent EF of 35-40% based on 08/09/2015 2-D echocardiogram 2. Currently appears euvolemic 3. Continue to monitor for now 5. Hyperlipidemia 1. On statin prior to admission  DVT prophylaxis: SCDs  Code Status: Full code Family Communication: Patient in room  Disposition Plan: Uncertain at this time Consults called: Gastroenterology to be consulted Admission status: Med telemetry, observation   Tryston Gilliam, Scheryl Marten MD [facility] Pager [phone]  If 7PM-7AM, please contact night-coverage [email or web address] Password [facility]  03/19/2016, 8:43 AM

## 2016-03-20 DIAGNOSIS — I110 Hypertensive heart disease with heart failure: Secondary | ICD-10-CM | POA: Diagnosis present

## 2016-03-20 DIAGNOSIS — F17210 Nicotine dependence, cigarettes, uncomplicated: Secondary | ICD-10-CM | POA: Diagnosis present

## 2016-03-20 DIAGNOSIS — K2970 Gastritis, unspecified, without bleeding: Secondary | ICD-10-CM | POA: Diagnosis not present

## 2016-03-20 DIAGNOSIS — Z8249 Family history of ischemic heart disease and other diseases of the circulatory system: Secondary | ICD-10-CM | POA: Diagnosis not present

## 2016-03-20 DIAGNOSIS — I255 Ischemic cardiomyopathy: Secondary | ICD-10-CM | POA: Diagnosis present

## 2016-03-20 DIAGNOSIS — K922 Gastrointestinal hemorrhage, unspecified: Secondary | ICD-10-CM | POA: Diagnosis not present

## 2016-03-20 DIAGNOSIS — I252 Old myocardial infarction: Secondary | ICD-10-CM | POA: Diagnosis not present

## 2016-03-20 DIAGNOSIS — R1013 Epigastric pain: Secondary | ICD-10-CM | POA: Diagnosis not present

## 2016-03-20 DIAGNOSIS — I10 Essential (primary) hypertension: Secondary | ICD-10-CM | POA: Diagnosis not present

## 2016-03-20 DIAGNOSIS — K921 Melena: Secondary | ICD-10-CM | POA: Diagnosis present

## 2016-03-20 DIAGNOSIS — E785 Hyperlipidemia, unspecified: Secondary | ICD-10-CM | POA: Diagnosis present

## 2016-03-20 DIAGNOSIS — Z809 Family history of malignant neoplasm, unspecified: Secondary | ICD-10-CM | POA: Diagnosis not present

## 2016-03-20 DIAGNOSIS — K2960 Other gastritis without bleeding: Secondary | ICD-10-CM | POA: Diagnosis present

## 2016-03-20 DIAGNOSIS — K259 Gastric ulcer, unspecified as acute or chronic, without hemorrhage or perforation: Secondary | ICD-10-CM | POA: Diagnosis present

## 2016-03-20 DIAGNOSIS — I2510 Atherosclerotic heart disease of native coronary artery without angina pectoris: Secondary | ICD-10-CM | POA: Diagnosis present

## 2016-03-20 DIAGNOSIS — D62 Acute posthemorrhagic anemia: Secondary | ICD-10-CM | POA: Diagnosis present

## 2016-03-20 DIAGNOSIS — I5022 Chronic systolic (congestive) heart failure: Secondary | ICD-10-CM | POA: Diagnosis present

## 2016-03-20 DIAGNOSIS — Z825 Family history of asthma and other chronic lower respiratory diseases: Secondary | ICD-10-CM | POA: Diagnosis not present

## 2016-03-20 DIAGNOSIS — K2980 Duodenitis without bleeding: Secondary | ICD-10-CM | POA: Diagnosis present

## 2016-03-20 DIAGNOSIS — Z955 Presence of coronary angioplasty implant and graft: Secondary | ICD-10-CM | POA: Diagnosis not present

## 2016-03-20 DIAGNOSIS — Z7982 Long term (current) use of aspirin: Secondary | ICD-10-CM | POA: Diagnosis not present

## 2016-03-20 DIAGNOSIS — Z833 Family history of diabetes mellitus: Secondary | ICD-10-CM | POA: Diagnosis not present

## 2016-03-20 LAB — TYPE AND SCREEN
ABO/RH(D): B POS
ANTIBODY SCREEN: NEGATIVE
Unit division: 0

## 2016-03-20 LAB — COMPREHENSIVE METABOLIC PANEL
ALBUMIN: 2.8 g/dL — AB (ref 3.5–5.0)
ALT: 21 U/L (ref 17–63)
AST: 13 U/L — AB (ref 15–41)
Alkaline Phosphatase: 82 U/L (ref 38–126)
Anion gap: 5 (ref 5–15)
BUN: 12 mg/dL (ref 6–20)
CHLORIDE: 105 mmol/L (ref 101–111)
CO2: 25 mmol/L (ref 22–32)
Calcium: 8.3 mg/dL — ABNORMAL LOW (ref 8.9–10.3)
Creatinine, Ser: 0.87 mg/dL (ref 0.61–1.24)
GFR calc Af Amer: >60 mL/min (ref >60)
GFR calc non Af Amer: >60 mL/min (ref >60)
GLUCOSE: 102 mg/dL — AB (ref 65–99)
POTASSIUM: 4.3 mmol/L (ref 3.5–5.1)
Sodium: 135 mmol/L (ref 135–145)
Total Bilirubin: 0.5 mg/dL (ref 0.3–1.2)
Total Protein: 5.6 g/dL — ABNORMAL LOW (ref 6.5–8.1)

## 2016-03-20 LAB — CBC
HEMATOCRIT: 29.2 % — AB (ref 39.0–52.0)
Hemoglobin: 9.9 g/dL — ABNORMAL LOW (ref 13.0–17.0)
MCH: 32.1 pg (ref 26.0–34.0)
MCHC: 33.9 g/dL (ref 30.0–36.0)
MCV: 94.8 fL (ref 78.0–100.0)
PLATELETS: 342 10*3/uL (ref 150–400)
RBC: 3.08 MIL/uL — ABNORMAL LOW (ref 4.22–5.81)
RDW: 15.3 % (ref 11.5–15.5)
WBC: 7.9 10*3/uL (ref 4.0–10.5)

## 2016-03-20 MED ORDER — BISACODYL 10 MG RE SUPP: 10.0000 mg | Freq: Every day | RECTAL | Status: DC | PRN

## 2016-03-20 NOTE — Progress Notes (Signed)
PROGRESS NOTE    Cameron Hoover  PRF:[id number] DOB: 1956/03/18 DOA: 03/19/2016 PCP: Pcp Not In System    Brief Narrative:  60 y.o. male with medical history significant of CAD status post stent placement, most recent cath was in October 2016. Patient also has a history of hyperlipidemia and hypertension. Patient presented to the emergency department with acute onset "chest pain" which awoke patient on the early morning of hospital admission. On further questioning, patient reported "on and off" epigastric pains over the past week associated with black stools. Patient also reports bouts of sob with exertion over the past week. No BRBPR. Of note, patient is s/p screening colonoscopy by Dr. Ewing Schlein recently and per pt, findings only of some polyps  Assessment & Plan:   Principal Problem:   Upper gastrointestinal bleeding Active Problems:   Hyperlipidemia   Stented coronary artery   Essential hypertension   Chronic systolic (congestive) heart failure (HCC)   Acute blood loss anemia   Upper GI bleed   1. Acute blood loss anemia secondary to presumed upper GI bleeding 1. Patient presents with epigastric pain, found to have hemoglobin of 10 (baseline hemoglobin around 14) 2. Patient reported black stools over the past week prior to admission, Hemoccult was positive in ER 3. Aspirin and brilinta on hold for the time being 4. Continue patient on IV PPI 5. Consulted gastroenterology. Plans for EGD on 03/21/2016. 2. History of CAD status post stenting 1. Status post cath and stenting, most recent in October 2016 2. Patient had been continued on aspirin prior to admission 3. Continue to hold antiplatelets/anticoagulants (ASA and brilinta) for the time being per above 4. Troponins are negative 4, including i-STAT troponin 3. Hypertension 1. Blood pressure currently stable 2. Continue to monitor 3. Continue home meds as tolerated 4. Chronic systolic CHF 1. Most recent EF of 35-40% based on  08/09/2015 2-D echocardiogram 2. Currently appears euvolemic 3. Continue to monitor for now 5. Hyperlipidemia 1. On statin prior to admission  DVT prophylaxis: SCDs Code Status: Full code Family Communication: Patient in room, family not at bedside Disposition Plan: Uncertain at this time  Consultants:   Gastroenterology  Procedures:     Antimicrobials: Anti-infectives    None       Subjective: No complaints currently. Patient states epigastric pain has improved since admission  Objective: Vitals:   03/19/16 2143 03/20/16 0618 03/20/16 0902 03/20/16 1408  BP: 100/60 (!) 110/51 (!) 105/54 (!) 109/39  Pulse: 64 (!) 58 (!) 47 (!) 56  Resp: 20 20  20   Temp: 99.6 F (37.6 C) 98.5 F (36.9 C)  97.7 F (36.5 C)  TempSrc: Oral Oral  Oral  SpO2: 93% 96%  100%  Weight:      Height:        Intake/Output Summary (Last 24 hours) at 03/20/16 1454 Last data filed at 03/20/16 1300  Gross per 24 hour  Intake          1202.08 ml  Output             1200 ml  Net             2.08 ml   Filed Weights   03/19/16 0601  Weight: 71.7 kg (158 lb)    Examination:  General exam: Appears calm and comfortable, Lying in bed  Respiratory system: Clear to auscultation. Respiratory effort normal. Cardiovascular system: S1 & S2 heard, RRR. No JVD, murmurs, rubs, gallops or clicks. No pedal edema. Gastrointestinal system:  Abdomen is nondistended, soft. No organomegaly or masses felt. Normal bowel sounds heard. Central nervous system: Alert and oriented. No focal neurological deficits. Extremities: Symmetric 5 x 5 power. Skin: No rashes, lesions Psychiatry: Judgement and insight appear normal. Mood & affect appropriate.   Data Reviewed: I have personally reviewed following labs and imaging studies  CBC:  Recent Labs Lab 03/19/16 0550 03/19/16 1000 03/19/16 2141 03/20/16 0627  WBC 10.0  --   --  7.9  NEUTROABS 5.9  --   --   --   HGB 10.0* 9.1* 9.7* 9.9*  HCT 30.0*  --   29.0* 29.2*  MCV 96.5  --   --  94.8  PLT 441*  --   --  342   Basic Metabolic Panel:  Recent Labs Lab 03/19/16 0550 03/20/16 0627  NA 138 135  K 4.2 4.3  CL 106 105  CO2 27 25  GLUCOSE 103* 102*  BUN 18 12  CREATININE 0.95 0.87  CALCIUM 8.6* 8.3*   GFR: Estimated Creatinine Clearance: 82.5 mL/min (by C-G formula based on SCr of 0.87 mg/dL). Liver Function Tests:  Recent Labs Lab 03/19/16 0550 03/20/16 0627  AST 22 13*  ALT 33 21  ALKPHOS 89 82  BILITOT 0.2* 0.5  PROT 6.9 5.6*  ALBUMIN 3.7 2.8*    Recent Labs Lab 03/19/16 0550  LIPASE 33   No results for input(s): AMMONIA in the last 168 hours. Coagulation Profile: No results for input(s): INR, PROTIME in the last 168 hours. Cardiac Enzymes:  Recent Labs Lab 03/19/16 0550 03/19/16 1000 03/19/16 1551 03/19/16 2141  TROPONINI <0.03 <0.03 <0.03 <0.03   BNP (last 3 results) No results for input(s): PROBNP in the last 8760 hours. HbA1C: No results for input(s): HGBA1C in the last 72 hours. CBG: No results for input(s): GLUCAP in the last 168 hours. Lipid Profile: No results for input(s): CHOL, HDL, LDLCALC, TRIG, CHOLHDL, LDLDIRECT in the last 72 hours. Thyroid Function Tests: No results for input(s): TSH, T4TOTAL, FREET4, T3FREE, THYROIDAB in the last 72 hours. Anemia Panel: No results for input(s): VITAMINB12, FOLATE, FERRITIN, TIBC, IRON, RETICCTPCT in the last 72 hours. Sepsis Labs: No results for input(s): PROCALCITON, LATICACIDVEN in the last 168 hours.  No results found for this or any previous visit (from the past 240 hour(s)).   Radiology Studies: Dg Chest 2 View  Result Date: 03/19/2016 CLINICAL DATA:  Left-sided chest pain EXAM: CHEST  2 VIEW COMPARISON:  05/28/2015 FINDINGS: Normal heart size and mediastinal contours. Bronchitic markings which may be related to patient's smoking history. No acute infiltrate or edema. No effusion or pneumothorax. No acute osseous findings. IMPRESSION: 1.  No acute finding. 2. Bronchitic markings. Electronically Signed   By: Marnee SpringJonathon  Watts M.D.   On: 03/19/2016 06:51    Scheduled Meds: . atorvastatin  80 mg Oral q1800  . lisinopril  5 mg Oral Daily  . metoprolol succinate  25 mg Oral Daily  . sodium chloride flush  3 mL Intravenous Q12H   Continuous Infusions: . sodium chloride    . dextrose 5 % and 0.9% NaCl 75 mL/hr at 03/20/16 0219  . pantoprozole (PROTONIX) infusion 8 mg/hr (03/20/16 0542)     LOS: 0 days   Tomorrow Dehaas, Scheryl MartenSTEPHEN K, MD [facility] Pager [phone]  If 7PM-7AM, please contact night-coverage [email or web address] Password [email or web address] 03/20/2016, 2:54 PM

## 2016-03-20 NOTE — Progress Notes (Signed)
  Assessment/Plan: ADMITTED WITH MELENA AND 5 GN DROP IN Hb AND LIKELY DUE TO PUD WHILE TAKING ASA/BRILINTA.  PLAN: 1. EGD 0730 AUG 21 2. CONTINUE PROTONIX GTT   Subjective: Since I last evaluated the patient HE HAS NOT HAD A BM. WANTS TO KNOW ABOUT SECOND OPINION REGARDING BACK/R LEG PAN. 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 NO NAUSEA OR VOMITING. MILD EPIGASTRIC PAIN.  Objective: Vital signs in last 24 hours: Vitals:   03/19/16 2143 03/20/16 0618  BP: 100/60 (!) 110/51  Pulse: 64 (!) 58  Resp: 20 20  Temp: 99.6 F (37.6 C) 98.5 F (36.9 C)     General appearance: alert, cooperative and no distress Resp: clear to auscultation bilaterally Cardio: regular rate and rhythm GI: soft, non-tender; bowel sounds normal; no masses,  no organomegaly  Lab Results:    AUG 19  AUG 20 Hb  9.7  9.9  Studies/Results: No results found.  Medications: I have reviewed the patient's current medications.   LOS: 5 days   Cameron Hoover 01/09/2014, 2:23 PM  

## 2016-03-21 ENCOUNTER — Telehealth: Payer: Self-pay | Admitting: Gastroenterology

## 2016-03-21 ENCOUNTER — Encounter (HOSPITAL_COMMUNITY)
Admission: EM | Disposition: A | Discharge status: Home / Self Care | Payer: Self-pay | Source: Home / Self Care | Attending: Internal Medicine

## 2016-03-21 ENCOUNTER — Encounter (HOSPITAL_COMMUNITY): Payer: Self-pay

## 2016-03-21 DIAGNOSIS — Z955 Presence of coronary angioplasty implant and graft: Secondary | ICD-10-CM

## 2016-03-21 DIAGNOSIS — K259 Gastric ulcer, unspecified as acute or chronic, without hemorrhage or perforation: Secondary | ICD-10-CM

## 2016-03-21 DIAGNOSIS — K921 Melena: Principal | ICD-10-CM

## 2016-03-21 DIAGNOSIS — K2970 Gastritis, unspecified, without bleeding: Secondary | ICD-10-CM

## 2016-03-21 DIAGNOSIS — K2980 Duodenitis without bleeding: Secondary | ICD-10-CM

## 2016-03-21 HISTORY — PX: ESOPHAGOGASTRODUODENOSCOPY: SHX5428

## 2016-03-21 LAB — CBC
HCT: 30.1 % — ABNORMAL LOW (ref 39.0–52.0)
Hemoglobin: 10.1 g/dL — ABNORMAL LOW (ref 13.0–17.0)
MCH: 31.8 pg (ref 26.0–34.0)
MCHC: 33.6 g/dL (ref 30.0–36.0)
MCV: 94.7 fL (ref 78.0–100.0)
PLATELETS: 372 10*3/uL (ref 150–400)
RBC: 3.18 MIL/uL — ABNORMAL LOW (ref 4.22–5.81)
RDW: 14.7 % (ref 11.5–15.5)
WBC: 8.5 10*3/uL (ref 4.0–10.5)

## 2016-03-21 SURGERY — EGD (ESOPHAGOGASTRODUODENOSCOPY)
Anesthesia: Moderate Sedation

## 2016-03-21 MED ORDER — LIDOCAINE VISCOUS 2 % MT SOLN
OROMUCOSAL | Status: AC
  Filled 2016-03-21: qty 15

## 2016-03-21 MED ORDER — LIDOCAINE VISCOUS 2 % MT SOLN
OROMUCOSAL | Status: DC | PRN
Start: 2016-03-21 — End: 2016-03-21
  Administered 2016-03-21: 1 via OROMUCOSAL

## 2016-03-21 MED ORDER — MIDAZOLAM HCL 5 MG/5ML IJ SOLN
INTRAMUSCULAR | Status: AC
  Filled 2016-03-21: qty 10

## 2016-03-21 MED ORDER — MEPERIDINE HCL 100 MG/ML IJ SOLN
INTRAMUSCULAR | Status: AC
  Filled 2016-03-21: qty 2

## 2016-03-21 MED ORDER — PANTOPRAZOLE SODIUM 40 MG PO TBEC
40.0000 mg | DELAYED_RELEASE_TABLET | Freq: Two times a day (BID) | ORAL | Status: DC
  Administered 2016-03-21: 40 mg via ORAL
  Filled 2016-03-21: qty 1

## 2016-03-21 MED ORDER — MEPERIDINE HCL 100 MG/ML IJ SOLN
INTRAMUSCULAR | Status: DC | PRN
  Administered 2016-03-21: 50 mg via INTRAVENOUS
  Administered 2016-03-21: 25 mg via INTRAVENOUS

## 2016-03-21 MED ORDER — MIDAZOLAM HCL 5 MG/5ML IJ SOLN
INTRAMUSCULAR | Status: DC | PRN
  Administered 2016-03-21 (×2): 2 mg via INTRAVENOUS

## 2016-03-21 MED ORDER — SODIUM CHLORIDE 0.9 % IV SOLN
INTRAVENOUS | Status: DC
Start: 2016-03-21 — End: 2016-03-21
  Administered 2016-03-21: 07:00:00 via INTRAVENOUS

## 2016-03-21 MED ORDER — PANTOPRAZOLE SODIUM 40 MG PO TBEC: 40.0000 mg | DELAYED_RELEASE_TABLET | Freq: Two times a day (BID) | ORAL | 0 refills | Status: DC

## 2016-03-21 NOTE — Interval H&P Note (Signed)
History and Physical Interval Note:  03/21/2016 7:44 AM  Cameron Hoover  has presented today for surgery, with the diagnosis of MELENA  The various methods of treatment have been discussed with the patient and family. After consideration of risks, benefits and other options for treatment, the patient has consented to  Procedure(s): ESOPHAGOGASTRODUODENOSCOPY (EGD) (N/A) as a surgical intervention .  The patient's history has been reviewed, patient examined, no change in status, stable for surgery.  I have reviewed the patient's chart and labs.  Questions were answered to the patient's satisfaction.     [employer]

## 2016-03-21 NOTE — Telephone Encounter (Signed)
Reminder in epic °

## 2016-03-21 NOTE — H&P (View-Only) (Signed)
  Assessment/Plan: ADMITTED WITH MELENA AND 5 GN DROP IN Hb AND LIKELY DUE TO PUD WHILE TAKING ASA/BRILINTA.  PLAN: 1. EGD 0730 AUG 21 2. CONTINUE PROTONIX GTT   Subjective: Since I last evaluated the patient HE HAS NOT HAD A BM. WANTS TO KNOW ABOUT SECOND OPINION REGARDING BACK/R LEG PAN. 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 NO NAUSEA OR VOMITING. MILD EPIGASTRIC PAIN.  Objective: Vital signs in last 24 hours: Vitals:   03/19/16 2143 03/20/16 0618  BP: 100/60 (!) 110/51  Pulse: 64 (!) 58  Resp: 20 20  Temp: 99.6 F (37.6 C) 98.5 F (36.9 C)     General appearance: alert, cooperative and no distress Resp: clear to auscultation bilaterally Cardio: regular rate and rhythm GI: soft, non-tender; bowel sounds normal; no masses,  no organomegaly  Lab Results:    AUG 19  AUG 20 Hb  9.7  9.9  Studies/Results: No results found.  Medications: I have reviewed the patient's current medications.   LOS: 5 days   Jonette Eva 01/09/2014, 2:23 PM

## 2016-03-21 NOTE — Progress Notes (Signed)
Pt back in room post Endo.  Drowsy, but responds to voice.  Follows commands.  Pt's BP 100/50's and HR 40-50's.  Pt asymptomatic.  Dr. Rhona Leavens paged and made aware.  New order to hold BP meds this morning.  Will continue to monitor.

## 2016-03-21 NOTE — Discharge Instructions (Signed)
Please resume your Aspirin on Thursday (8/24). Do not take Brilinta unless told to do so by your Cardiologist

## 2016-03-21 NOTE — Discharge Summary (Signed)
Physician Discharge Summary  Cameron Hoover HUT:[id number] DOB: 1956/06/19 DOA: 03/19/2016  PCP: Pcp Not In System  Admit date: 03/19/2016 Discharge date: 03/21/2016  Admitted From: Home Disposition:  Home  Recommendations for Outpatient Follow-up:  1. Follow up with PCP in 1-2 weeks 2. Follow up with Cardiology at next available appointment  Discharge Condition:Stable CODE STATUS:Full Diet recommendation: Heart healthy   Brief/Interim Summary: 60 y.o.malewith medical history significant of CAD status post stent placement, most recent cath was in October 2016. Patient also has a history of hyperlipidemia and hypertension. Patient presented to the emergency department with acute onset "chest pain"which awoke patient on the early morning of hospital admission. On further questioning, patient reported "on and off" epigastric pains over the past week associated with black stools. Patient also reports bouts of sob with exertion over the past week. No BRBPR. Of note, patient is s/p screening colonoscopy by Dr. Ewing Schlein recently and per pt, findings only of some polyps   1. Acute blood loss anemia secondary to presumed upper GI bleeding 1. Patient presents with epigastric pain, found to have hemoglobin of 10 (baseline hemoglobin around 14) 2. Patient reported black stools over the past week prior to admission, Hemoccult was positive in ER 3. Aspirin and brilintawere placed on hold 4. Continued patient on IV PPI 5. Patient was given 1 unit of PRBCs as recommended by gastroenterology on 03/19/2016 6. Consulted gastroenterology. Pt underwent EGD on 8/21 with findings of one nonbleeding pyloric channel ulcer with mild erosive gastritis and duodenitis. 7. GI recommendations to resume aspirin on 03/24/2016. Patient to continue to hold Brilinta, pending discussion with cardiology 2. History of CAD status post stenting 1. Status post cath and stenting, most recent in October 2016 2. Patient had been  continued on aspirin prior to admission 3. Troponins are negative 4, including i-STAT troponin 4. Patient to resume aspirin on a 24 2017. Brilinta discontinued per above 3. Hypertension 1. Blood pressure currently stable 2. Continue home meds as tolerated 4. Chronic systolic CHF 1. Most recent EF of 35-40% based on 08/09/2015 2-D echocardiogram 2. Currently appears euvolemic 5. Hyperlipidemia 1. On statin prior to admission  Discharge Diagnoses:  Principal Problem:   Upper gastrointestinal bleeding Active Problems:   Hyperlipidemia   Stented coronary artery   Essential hypertension   Chronic systolic (congestive) heart failure (HCC)   Acute blood loss anemia   Upper GI bleed   Epigastric pain    Discharge Instructions     Medication List    STOP taking these medications   ticagrelor 90 MG Tabs tablet Commonly known as:  BRILINTA     TAKE these medications   aspirin 81 MG EC tablet Take 1 tablet (81 mg total) by mouth daily.   atorvastatin 80 MG tablet Commonly known as:  LIPITOR Take 1 tablet (80 mg total) by mouth daily at 6 PM.   lisinopril 5 MG tablet Commonly known as:  PRINIVIL,ZESTRIL Take 1 tablet (5 mg total) by mouth daily.   metoprolol succinate 25 MG 24 hr tablet Commonly known as:  TOPROL XL Take 1 tablet (25 mg total) by mouth daily.   oxyCODONE-acetaminophen 5-325 MG tablet Commonly known as:  PERCOCET/ROXICET Take by mouth every 4 (four) hours as needed for severe pain.   pantoprazole 40 MG tablet Commonly known as:  PROTONIX Take 1 tablet (40 mg total) by mouth 2 (two) times daily before a meal.   Vitamin D (Ergocalciferol) 50000 units Caps capsule Commonly known as:  DRISDOL  Take 50,000 Units by mouth once a week. Takes on Monday      Follow-up Information    Follow up with your PCP in 1-2 weeks In 1 week.        Jonette EvaSandi Fields, MD In 6 months.   Specialty:  Gastroenterology Why:  Office will call to schedule  appointment Contact information: [address] [place] [address] [phone]        Schedule an appointment as soon as possible for a visit with Tobias AlexanderKatarina Nelson, MD.   Specialty:  Cardiology Why:  at next available appointment Contact information: [address] [place] [address] [phone]          No Known Allergies  Consultations:  Gastroenterology  Procedures/Studies: Dg Chest 2 View  Result Date: 03/19/2016 CLINICAL DATA:  Left-sided chest pain EXAM: CHEST  2 VIEW COMPARISON:  05/28/2015 FINDINGS: Normal heart size and mediastinal contours. Bronchitic markings which may be related to patient's smoking history. No acute infiltrate or edema. No effusion or pneumothorax. No acute osseous findings. IMPRESSION: 1. No acute finding. 2. Bronchitic markings. Electronically Signed   By: Marnee SpringJonathon  Watts M.D.   On: 03/19/2016 06:51    Subjective: No complaints at present  Discharge Exam: Vitals:   03/21/16 0842 03/21/16 0954  BP: (!) 102/54 (!) 109/56  Pulse: (!) 49 (!) 47  Resp: 18 20  Temp: 97.7 F (36.5 C) 97.5 F (36.4 C)   Vitals:   03/21/16 0750 03/21/16 0755 03/21/16 0842 03/21/16 0954  BP: 137/73 135/74 (!) 102/54 (!) 109/56  Pulse: (!) 56 61 (!) 49 (!) 47  Resp: 15 17 18 20   Temp:   97.7 F (36.5 C) 97.5 F (36.4 C)  TempSrc:   Oral Oral  SpO2: 100% 99% 97% 100%  Weight:      Height:        General: Pt is alert, awake, not in acute distress Cardiovascular: RRR, S1/S2 +, no rubs, no gallops Respiratory: CTA bilaterally, no wheezing, no rhonchi Abdominal: Soft, NT, ND, bowel sounds + Extremities: no edema, no cyanosis   The results of significant diagnostics from this hospitalization (including imaging, microbiology, ancillary and laboratory) are listed below for reference.     Microbiology: No results found for this or any previous visit (from the past 240 hour(s)).   Labs: BNP (last 3 results)  Recent Labs   05/28/15 0400  BNP 133.1*   Basic Metabolic Panel:  Recent Labs Lab 03/19/16 0550 03/20/16 0627  NA 138 135  K 4.2 4.3  CL 106 105  CO2 27 25  GLUCOSE 103* 102*  BUN 18 12  CREATININE 0.95 0.87  CALCIUM 8.6* 8.3*   Liver Function Tests:  Recent Labs Lab 03/19/16 0550 03/20/16 0627  AST 22 13*  ALT 33 21  ALKPHOS 89 82  BILITOT 0.2* 0.5  PROT 6.9 5.6*  ALBUMIN 3.7 2.8*    Recent Labs Lab 03/19/16 0550  LIPASE 33   No results for input(s): AMMONIA in the last 168 hours. CBC:  Recent Labs Lab 03/19/16 0550 03/19/16 1000 03/19/16 2141 03/20/16 0627 03/21/16 0538  WBC 10.0  --   --  7.9 8.5  NEUTROABS 5.9  --   --   --   --   HGB 10.0* 9.1* 9.7* 9.9* 10.1*  HCT 30.0*  --  29.0* 29.2* 30.1*  MCV 96.5  --   --  94.8 94.7  PLT 441*  --   --  342 372  Cardiac Enzymes:  Recent Labs Lab 03/19/16 0550 03/19/16 1000 03/19/16 1551 03/19/16 2141  TROPONINI <0.03 <0.03 <0.03 <0.03   BNP: Invalid input(s): POCBNP CBG: No results for input(s): GLUCAP in the last 168 hours. D-Dimer No results for input(s): DDIMER in the last 72 hours. Hgb A1c No results for input(s): HGBA1C in the last 72 hours. Lipid Profile No results for input(s): CHOL, HDL, LDLCALC, TRIG, CHOLHDL, LDLDIRECT in the last 72 hours. Thyroid function studies No results for input(s): TSH, T4TOTAL, T3FREE, THYROIDAB in the last 72 hours.  Invalid input(s): FREET3 Anemia work up No results for input(s): VITAMINB12, FOLATE, FERRITIN, TIBC, IRON, RETICCTPCT in the last 72 hours. Urinalysis No results found for: COLORURINE, APPEARANCEUR, LABSPEC, PHURINE, GLUCOSEU, HGBUR, BILIRUBINUR, KETONESUR, PROTEINUR, UROBILINOGEN, NITRITE, LEUKOCYTESUR Sepsis Labs Invalid input(s): PROCALCITONIN,  WBC,  LACTICIDVEN Microbiology No results found for this or any previous visit (from the past 240 hour(s)).   SIGNED:   Jerald Kief, MD  [facility] 03/21/2016, 11:02 AM  If 7PM-7AM,  please contact night-coverage [email or web address] Password [email or web address]

## 2016-03-21 NOTE — Telephone Encounter (Signed)
-   Repeat upper endoscopy in 3 months for surveillance, Dx: PUD.  - Return to GI office in 6 months, Dx: PUD, CHRONIC ASA USE   -RE-START ASPIRIN AUG AUG 24. PT NEEDS TO DISCUSS CONITINUING BRILINTA WITH DR. Delton See. -PROTONIX BID FOR 3 MOS THEN ONCE DAILY FOREVER

## 2016-03-21 NOTE — Progress Notes (Signed)
Pt discharged home today per Dr. Rhona Leavens.  Pt's IV site D/C'd and WDL.  Pt's VSS.  Pt's provided with home medication list, discharge instructions and prescriptions.  Verbalized understanding.  Pt ambulated off floor in stable condition accompanied by RN.

## 2016-03-21 NOTE — Op Note (Signed)
[facility] Patient Name: Cameron FellingBruce Wix Procedure Date: 03/21/2016 7:38 AM MRN: [id number] Date of Birth: 05/06/1956 Attending MD: Cameron Hoover , MD CSN: [id number] Age: [age] Admit Type: Inpatient Procedure:                Upper GI endoscopy WITH COLD FORCEPS BIOPSY Indications:              Melena Providers:                Cameron EvaSandi Jonael Paradiso, MD, Cameron CelestineAnitra Bell, RN, Cameron Marrowaylor B.                            Val Hoover, Technician Referring MD:             Cameron AlexanderKATARINA NELSON, MD Medicines:                Meperidine 75 mg IV, Midazolam 4 mg IV Complications:            No immediate complications. Estimated Blood Loss:     Estimated blood loss was minimal. Procedure:                Pre-Anesthesia Assessment:                           - Prior to the procedure, a History and Physical                            was performed, and patient medications and                            allergies were reviewed. The patient's tolerance of                            previous anesthesia was also reviewed. The risks                            and benefits of the procedure and the sedation                            options and risks were discussed with the patient.                            All questions were answered, and informed consent                            was obtained. Prior Anticoagulants: The patient                            last took aspirin 3 days and antiplatelet                            medication 3 days prior to the procedure. ASA Grade                            Assessment: II - A patient with mild systemic  disease. After reviewing the risks and benefits,                            the patient was deemed in satisfactory condition to                            undergo the procedure. After obtaining informed                            consent, the endoscope was passed under direct                            vision. Throughout the procedure, the patient's            blood pressure, pulse, and oxygen saturations were                            monitored continuously. The EG-[id number] ([id number])                            scope was introduced through the mouth, and                            advanced to the second part of duodenum. The upper                            GI endoscopy was accomplished without difficulty.                            The patient tolerated the procedure well. Scope In: 7:53:47 AM Scope Out: 8:00:46 AM Total Procedure Duration: 0 hours 6 minutes 59 seconds  Findings:      The examined esophagus was normal.      One non-bleeding cratered CLEAN BASED gastric ulcer with no stigmata of       bleeding was found at the pylorus.      Scattered mild inflammation characterized by congestion (edema),       erosions and erythema was found in the gastric antrum. Biopsies were       taken with a cold forceps for Helicobacter pylori testing.      Patchy mild inflammation characterized by congestion (edema) and       erythema was found in the duodenal bulb. Impression:               - MELENA DUE TO PYLORIC CHANNEL ULCER                           - MILD EROSIVE Gastritis AND DUODENITIS Moderate Sedation:      Moderate (conscious) sedation was administered by the endoscopy nurse       and supervised by the endoscopist. The following parameters were       monitored: oxygen saturation, heart rate, blood pressure, and response       to care. Total physician intraservice time was 15 minutes. Recommendation:           - Cardiac diet.                           -  Continue present medications.                           - Await pathology results.                           - Repeat upper endoscopy in 3 months for                            surveillance.                           - Return to GI office in 6 months.                           -RE-START ASPIRIN AUG AUG 24. PT NEEDS TO DISCUSS                            CONITINUING BRILINTA WITH DR.  Delton Hoover.                           -PROTONIX BID FOR 3 MOS THEN ONCE DAILY FOREVER                           - Return patient to hospital ward. Procedure Code(s):        --- Professional ---                           [id number], Esophagogastroduodenoscopy, flexible,                            transoral; with biopsy, single or multiple                           99152, Moderate sedation services provided by the                            same physician or other qualified health care                            professional performing the diagnostic or                            therapeutic service that the sedation supports,                            requiring the presence of an independent trained                            observer to assist in the monitoring of the                            patient's level of consciousness and physiological                            status;  initial 15 minutes of intraservice time,                            patient age 21 years or older Diagnosis Code(s):        --- Professional ---                           K25.9, Gastric ulcer, unspecified as acute or                            chronic, without hemorrhage or perforation                           K29.70, Gastritis, unspecified, without bleeding                           K29.80, Duodenitis without bleeding                           K92.1, Melena (includes Hematochezia) CPT copyright 2016 American Medical Association. All rights reserved. The codes documented in this report are preliminary and upon coder review may  be revised to meet current compliance requirements. Cameron Eva, MD Cameron Eva, MD 03/21/2016 8:21:47 AM This report has been signed electronically. Number of Addenda: 0

## 2016-03-23 ENCOUNTER — Encounter (HOSPITAL_COMMUNITY): Payer: Self-pay | Admitting: Gastroenterology

## 2016-03-24 ENCOUNTER — Telehealth: Payer: Self-pay | Admitting: Gastroenterology

## 2016-03-24 NOTE — Telephone Encounter (Signed)
Please call pt. His stomach Bx shows ULCER & Gastritis DUE TO ASA USE.   SPEAK WITH DR Delton See REGARDING NEED FOR BRILINTA. RE-START ASA AUG 24. PROTONIX BID FOR 3 MOS THE ONCE DAILY FOREVER.  REPEAT EGD IN NOV 2017.  FOLLOW UP IN FEB 2017.

## 2016-03-24 NOTE — Telephone Encounter (Signed)
LMOM to call.

## 2016-03-24 NOTE — Telephone Encounter (Signed)
PT is aware.

## 2016-03-25 NOTE — Telephone Encounter (Signed)
Reminder in epic °

## 2016-03-27 ENCOUNTER — Other Ambulatory Visit: Payer: Self-pay | Admitting: Cardiology

## 2016-03-29 ENCOUNTER — Encounter: Payer: Self-pay | Admitting: Physician Assistant

## 2016-03-29 DIAGNOSIS — I2510 Atherosclerotic heart disease of native coronary artery without angina pectoris: Secondary | ICD-10-CM | POA: Insufficient documentation

## 2016-03-29 NOTE — Progress Notes (Addendum)
Cardiology Office Note    Date:  03/30/2016  ID:  Cameron Hoover, DOB 11-30-1955, MRN [id number] PCP:  Pcp Not In System  Cardiologist:  Delton See  Chief Complaint: f/u GIB  History of Present Illness:  Cameron Hoover is a 60 y.o. male with history of CAD (previously nonobstructive, then severe dyspnea/acute CHF 05/2015 s/p DES to mLAD), bradycardia (not on BB due to this), chronic combined CHF (EF 35-40% in 08/2015), hyperlipidemia, UGIB 03/2016 (gastritis/duodenitis) who presents for f/u. Last 2D echo 08/2015: EF 35%, grade 1 DD, akinesis of inferolat myocardium.  Admitted 8/19-8/21/17 with epigastric pain, black stools, and ABL anemia (Hgb 10, baseline around 14), +FOBT, txfused 1 U PRBC. Troponins negative. Pt underwent EGD on 8/21 with findings of one nonbleeding pyloric channel ulcer with mild erosive gastritis and duodenitis. GI felt this was due to aspirin use. ASA/Brilinta were initially held - ASA resumed on 03/24/16 with recommendation to f/u here to discuss Brilinta. On PPI per GI as well. Most recent Cr 0.87, albumin 2.8, Hgb 10.1.  Returns for follow-up doing well from cardiac standpoint. No CP, SOB, palpitations, syncope, dizziness. BP tends to run on lower side, asx. No further bleeding reported. He is coming up in the fall to possibly have back surgery but doesn't have any further details or timeframe - he says his friends are urging a second opinion for pinched nerve. He reports intermittent leg and hip pain, occurs at rest and occasionally with walking but not always.    Past Medical History:  Diagnosis Date  . Acute blood loss anemia 03/2016  . Bradycardia    a. not on BB due to this.  . Chronic combined systolic and diastolic CHF (congestive heart failure) (HCC)   . Coronary artery disease    a. previously nonobstructive. b. then severe dyspnea/acute CHF 05/2015 s/p DES to mLAD.  Marland Kitchen Duodenitis 03/2016  . Gastritis 03/2016  . GI bleed    a. 03/2016 with ABL anemia -  (gastritis/duodenitis by EGD).  . Hyperlipidemia     Past Surgical History:  Procedure Laterality Date  . CARDIAC CATHETERIZATION  2007  . CARDIAC CATHETERIZATION N/A 05/28/2015   Procedure: Left Heart Cath and Cors/Grafts Angiography;  Surgeon: Lennette Bihari, MD;  Location: [facility] INVASIVE CV LAB;  Service: Cardiovascular;  Laterality: N/A;  . CARDIAC CATHETERIZATION N/A 05/28/2015   Procedure: Coronary Stent Intervention;  Surgeon: Lennette Bihari, MD;  Location: [facility] INVASIVE CV LAB;  Service: Cardiovascular;  Laterality: N/A;  . ESOPHAGOGASTRODUODENOSCOPY N/A 03/21/2016   Procedure: ESOPHAGOGASTRODUODENOSCOPY (EGD);  Surgeon: West Bali, MD;  Location: [facility] ENDO SUITE;  Service: Endoscopy;  Laterality: N/A;    Current Medications: Current Outpatient Prescriptions  Medication Sig Dispense Refill  . aspirin EC 81 MG EC tablet Take 1 tablet (81 mg total) by mouth daily. 30 tablet 0  . atorvastatin (LIPITOR) 80 MG tablet Take 1 tablet (80 mg total) by mouth daily at 6 PM. 90 tablet 3  . furosemide (LASIX) 20 MG tablet Take 20 mg by mouth daily.   2  . lisinopril (PRINIVIL,ZESTRIL) 5 MG tablet Take 1 tablet (5 mg total) by mouth daily. 30 tablet 11  . metoprolol succinate (TOPROL-XL) 25 MG 24 hr tablet TAKE 1 TABLET (25 MG TOTAL) BY MOUTH DAILY. 30 tablet 6  . oxyCODONE-acetaminophen (PERCOCET/ROXICET) 5-325 MG tablet Take by mouth every 4 (four) hours as needed for severe pain.    . pantoprazole (PROTONIX) 40 MG tablet Take 1 tablet (40 mg  total) by mouth 2 (two) times daily before a meal. 60 tablet 0  . Vitamin D, Ergocalciferol, (DRISDOL) 50000 units CAPS capsule Take 50,000 Units by mouth once a week. Takes on Monday  0   No current facility-administered medications for this visit.      Allergies:   Review of patient's allergies indicates no known allergies.   Social History   Social History  . Marital status: Legally Separated    Spouse name: N/A  . Number of children: N/A  .  Years of education: N/A   Social History Main Topics  . Smoking status: Current Every Day Smoker    Packs/day: 1.00    Years: 46.00    Types: Cigarettes  . Smokeless tobacco: Never Used  . Alcohol use 8.4 oz/week    14 Cans of beer per week  . Drug use: No  . Sexual activity: Not Asked   Other Topics Concern  . None   Social History Narrative  . None     Family History:  The patient's family history includes Cancer in his father and mother; Diabetes in his mother; Emphysema in his father; Heart attack in his brother and brother; Heart attack (age of onset: 140) in his cousin.   ROS:   Please see the history of present illness.   All other systems are reviewed and otherwise negative.    PHYSICAL EXAM:   VS:  BP (!) 102/58   Pulse 76   Ht 5\' 6"  (1.676 m)   Wt 158 lb (71.7 kg)   BMI 25.50 kg/m   BMI: Body mass index is 25.5 kg/m. GEN: Well nourished, well developed AAM, in no acute distress  HEENT: normocephalic, atraumatic Neck: no JVD, carotid bruits, or masses Cardiac: RRR; no murmurs, rubs, or gallops, no edema, 1+ pedal pulses bilaterally Respiratory:  clear to auscultation bilaterally, normal work of breathing GI: soft, nontender, nondistended, + BS MS: no deformity or atrophy  Skin: warm and dry, no rash Neuro:  Alert and Oriented x 3, Strength and sensation are intact, follows commands Psych: euthymic mood, full affect  Wt Readings from Last 3 Encounters:  03/30/16 158 lb (71.7 kg)  03/19/16 158 lb (71.7 kg)  11/23/15 162 lb (73.5 kg)      Studies/Labs Reviewed:   EKG:  EKG was not ordered today.  Recent Labs: 05/28/2015: B Natriuretic Peptide 133.1 03/20/2016: ALT 21; BUN 12; Creatinine, Ser 0.87; Potassium 4.3; Sodium 135 03/21/2016: Hemoglobin 10.1; Platelets 372   Lipid Panel No results found for: CHOL, TRIG, HDL, CHOLHDL, VLDL, LDLCALC, LDLDIRECT  Additional studies/ records that were reviewed today include: Summarized above.    ASSESSMENT  & PLAN:   1. CAD - stable from cardiac standpoint but recent interruption in ASA/Brilinta. GI recommended to resume ASA but notes also indicate gastritis was felt due to aspirin use. D/w Dr. Delton SeeNelson who reviewed with Dr. Clifton JamesMcAlhany. Dr. Clifton JamesMcAlhany felt he was likely OK with 2nd generation DES to remain off Brilinta given completed 11 months of therapy. However, with concern from GI that ASA may have caused gastritis, Dr. Delton SeeNelson and Dr. Clifton JamesMcAlhany wonder if he would be better suited for Brilinta monotherapy instead - I have sent a message to Dr. Darrick PennaFields to obtain their thoughts. 2. ABL anemia/UGI bleed - see above. It also appears the patient reported drinking 14 cans of beer a week and was instructed to refrain from this as alcohol can contribute to recurrent GIB. 3. Chronic combined CHF - appears euvolemic. Continue  BB/ACEI. 4. Leg pain - somewhat atypical for claudication but some exertional component - patient is seeking definitive eval prior to possible back surgery due to dx of pinched nerve. Will screen for PAD with LE vascular testing.   Disposition: F/u with Dr. Delton See in 05/2016 at which time we can visit the issue of pre-op clearance.  Medication Adjustments/Labs and Tests Ordered: Current medicines are reviewed at length with the patient today.  Concerns regarding medicines are outlined above. Medication changes, Labs and Tests ordered today are summarized above and listed in the Patient Instructions accessible in Encounters.   Thomasene Mohair PA-C  03/30/2016 9:36 AM    [facility] [address] [place], [place], [place]  [address] Phone: [phone]; Fax: [phone]

## 2016-03-30 ENCOUNTER — Encounter: Payer: Self-pay | Admitting: Physician Assistant

## 2016-03-30 ENCOUNTER — Ambulatory Visit (INDEPENDENT_AMBULATORY_CARE_PROVIDER_SITE_OTHER): Payer: BLUE CROSS/BLUE SHIELD | Admitting: Physician Assistant

## 2016-03-30 VITALS — BP 102/58 | HR 76 | Ht 66.0 in | Wt 158.0 lb

## 2016-03-30 DIAGNOSIS — M79605 Pain in left leg: Secondary | ICD-10-CM

## 2016-03-30 DIAGNOSIS — I5042 Chronic combined systolic (congestive) and diastolic (congestive) heart failure: Secondary | ICD-10-CM | POA: Diagnosis not present

## 2016-03-30 DIAGNOSIS — I2510 Atherosclerotic heart disease of native coronary artery without angina pectoris: Secondary | ICD-10-CM

## 2016-03-30 DIAGNOSIS — D62 Acute posthemorrhagic anemia: Secondary | ICD-10-CM | POA: Diagnosis not present

## 2016-03-30 DIAGNOSIS — K922 Gastrointestinal hemorrhage, unspecified: Secondary | ICD-10-CM | POA: Diagnosis not present

## 2016-03-30 DIAGNOSIS — M79604 Pain in right leg: Secondary | ICD-10-CM

## 2016-03-30 NOTE — Patient Instructions (Signed)
**Note De-Identified Cameron Hoover Obfuscation** Medication Instructions:  Same-no changes  Labwork: None  Testing/Procedures: Your physician has requested that you have a lower extremity arterial duplex. This test is an ultrasound of the arteries in the legs. It looks at arterial blood flow in the legs. Allow one hour for Lower Arterial scans. There are no restrictions or special instructions   Follow-Up: With Dr Delton See in October.  Any Other Special Instructions Will Be Listed Below (If Applicable). Please limit your alcohol consumption as alcohol contributes to bleeding issues.     If you need a refill on your cardiac medications before your next appointment, please call your pharmacy.

## 2016-03-31 ENCOUNTER — Other Ambulatory Visit: Payer: Self-pay | Admitting: Physician Assistant

## 2016-03-31 DIAGNOSIS — I739 Peripheral vascular disease, unspecified: Secondary | ICD-10-CM

## 2016-04-06 ENCOUNTER — Telehealth: Payer: Self-pay | Admitting: Gastroenterology

## 2016-04-06 NOTE — Telephone Encounter (Signed)
I called and informed pt.

## 2016-04-06 NOTE — Telephone Encounter (Signed)
Pt called back and said [place] in [place] will no longer give him Oxycodone for his back and nerve pain.  He would like to know if Dr. Darrick Penna can recommend something over the counter that will help his pain.

## 2016-04-06 NOTE — Telephone Encounter (Signed)
PT called back and asked if Dr. Darrick Penna would tell him what he can take over the counter that will not go against his liver or his kidneys.

## 2016-04-06 NOTE — Telephone Encounter (Signed)
LMOM to call.

## 2016-04-06 NOTE — Telephone Encounter (Addendum)
PLEASE CALL PT. I CANNOT PRESCRIBE PAIN MEDS FOR NON-GI PROBLEMS HE SHOULD ASK HIS PCP TO REFER HIM TO A PAIN SPECIALIST.

## 2016-04-06 NOTE — Telephone Encounter (Signed)
Pt called to say that he needed SF to write him a prescription for pain. (The phone connection was hard to him) [phone]

## 2016-04-07 NOTE — Telephone Encounter (Signed)
LMOM to call.

## 2016-04-07 NOTE — Telephone Encounter (Signed)
PLEASE CALL PT. FOR HIS MUSCULOSKELETAL PAIN HE COULD USE TYLENOL XS 2 EVERY 6 HOURS, NO MORE THAN 8 PER DAY.

## 2016-04-08 ENCOUNTER — Telehealth: Payer: Self-pay | Admitting: Physician Assistant

## 2016-04-08 ENCOUNTER — Ambulatory Visit (HOSPITAL_COMMUNITY)
Admission: RE | Admit: 2016-04-08 | Discharge: 2016-04-08 | Disposition: A | Discharge status: Home / Self Care | Payer: BLUE CROSS/BLUE SHIELD | Source: Ambulatory Visit | Attending: Cardiology | Admitting: Cardiology

## 2016-04-08 DIAGNOSIS — I739 Peripheral vascular disease, unspecified: Secondary | ICD-10-CM | POA: Diagnosis not present

## 2016-04-08 DIAGNOSIS — Z720 Tobacco use: Secondary | ICD-10-CM | POA: Insufficient documentation

## 2016-04-08 DIAGNOSIS — I5042 Chronic combined systolic (congestive) and diastolic (congestive) heart failure: Secondary | ICD-10-CM

## 2016-04-08 DIAGNOSIS — K922 Gastrointestinal hemorrhage, unspecified: Secondary | ICD-10-CM

## 2016-04-08 DIAGNOSIS — M79604 Pain in right leg: Secondary | ICD-10-CM

## 2016-04-08 DIAGNOSIS — E785 Hyperlipidemia, unspecified: Secondary | ICD-10-CM | POA: Insufficient documentation

## 2016-04-08 DIAGNOSIS — I10 Essential (primary) hypertension: Secondary | ICD-10-CM | POA: Diagnosis not present

## 2016-04-08 DIAGNOSIS — I2510 Atherosclerotic heart disease of native coronary artery without angina pectoris: Secondary | ICD-10-CM | POA: Insufficient documentation

## 2016-04-08 DIAGNOSIS — M79605 Pain in left leg: Secondary | ICD-10-CM

## 2016-04-08 MED ORDER — TICAGRELOR 90 MG PO TABS: 90.0000 mg | ORAL_TABLET | Freq: Two times a day (BID) | ORAL | 3 refills | Status: DC

## 2016-04-08 NOTE — Telephone Encounter (Signed)
LMOM to call and that we close at noon today.

## 2016-04-08 NOTE — Telephone Encounter (Signed)
Pt contacted about conversation and recommendations from Dr Darrick Penna, Dr Delton See, and Ronie Spies PA-C.  Informed the pt that per Dr Delton See and Kriste Basque, given his GI hx, they recommend that he stop ASA, and start Brilinta 90 mg po BID and come in for a cbc w diff x 1 week. Confirmed the pharmacy of choice with the pt.  Scheduled the pt a lab appt to check a cbc for next Thursday 9/14 at our office, for the pt states he will start his med tomorrow and can only come next Thursday 9/14 and not 9/15. Pt verbalized understanding and agrees with this plan.  Will route to [facility] as an [place].

## 2016-04-08 NOTE — Telephone Encounter (Signed)
Cameron GrayHey Cameron - Per discussion below through the following staff message conversation, Cameron Hoover recommends to stop aspirin and start Brilinta 90mg  BID. Please arrange for CBC 1 week after med switch. Thanks! Cameron Dunn PA-C  -------------------------------------------------------------------  From: Cameron MassonKatarina H Hoover, Cameron Hoover  Sent: 04/06/2016  4:26 PM  To: Cameron Montanaayna N Dunn, PA-C   I would try Brilinta, follow Hemoglobin and only switch to Plavix if there is a drop.  KN   ----- Message -----  From: Cameron Montanaayna N Dunn, PA-C  Sent: 04/05/2016 12:14 PM  To: Cameron MassonKatarina H Hoover, Cameron Hoover   Cameron Hoover sent me this additional follow-up message this AM as an FYI. Does this change your thought on Brilinta monotherapy?   ----- Message -----  From: Cameron BaliSandi L Hoover, Cameron Hoover  Sent: 04/05/2016  9:17 AM  To: Cameron Montanaayna N Dunn, PA-C   Cameron,   Good morning! ACCORDING TO EVIDENCE BASED MEDICINE, Cameron Hoover is less likely to have another GI BLEED ON ASA/PPI than on Plavix alone. We do not have any data on Brilinta.   Take care,  Cameron Hoover   ----- Message -----  From: Cameron MassonKatarina H Hoover, Cameron Hoover  Sent: 04/04/2016 10:14 AM  To: Cameron Montanaayna N Dunn, PA-C  Subject: RE: Aspirin/Brilinta               No    ----- Message -----  From: Cameron Montanaayna N Dunn, PA-C  Sent: 04/03/2016  5:38 PM  To: Cameron MassonKatarina H Hoover, Cameron Hoover  Subject: FW: Aspirin/Brilinta               Does he need any reloading?  ----- Message -----  From: Cameron MassonKatarina H Hoover, Cameron Hoover  Sent: 04/03/2016  5:25 PM  To: Cameron Montanaayna N Dunn, PA-C, Cameron SocksIvy M Hoover, Cameron Hoover  Subject: RE: Aspirin/Brilinta               I would restart Brilinta only, thank you!   ----- Message -----  From: Cameron Montanaayna N Dunn, PA-C  Sent: 04/03/2016  7:35 AM  To: Cameron MassonKatarina H Hoover, Cameron Hoover  Subject: FW: Aspirin/Brilinta               Hoover below - do you want to restart Brilinta with aspirin, or restart it on its own?  ----- Message -----  From: Cameron BaliSandi L Hoover, Cameron Hoover  Sent: 04/01/2016 10:18 AM  To:  Cameron Montanaayna N Dunn, PA-C  Subject: RE: Aspirin/Brilinta               Cameron,   Good morning! I reviewed Cameron Hoover' EGD REPORT from Aug 21. Cameron Hoover had a 5-6 unit UGI BLEED due to ASA USE while on BRILINTA He likely developed an ulcer because he was not on a PPI. Alcohol causes gastritis not ulcers. He is 10 days post EGD. He was asked to re-start his ASA ON AUG 24. I would defer to Cameron Hoover regarding single v. dual therapy. I don't Hoover any reason why anti-platelet therapy cannot be re-started. His ulcer shows be well on its way to resolving but he needs to remain on a PPI indefinitely.   Take care,  Cameron Hoover    ----- Message -----  From: Cameron Montanaayna N Dunn, PA-C  Sent: 03/30/2016  9:49 AM  To: Cameron Montanaayna N Dunn, PA-C, Cameron BaliSandi L Hoover, Cameron Hoover  Subject: Aspirin/Brilinta                 Hi Cameron Hoover,  My name is Cameron Hoover and I'm one of the PAs that works with Dr.  Nelson [facility] Hoover). I saw Cameron Hoover in clinic today to discuss resumption of his Brilinta. Cameron Hoover further discussed with Cameron Hoover, one of our interventionalists who felt it was probably OK for him to remain off Brilinta since he's 11 months out from PCI and has been stable from a cardiac standpoint. However, we Hoover your notes in Epic that his gastritis was felt to be due to aspirin. He also drinks 14 beers a week (was advised to cut down). Cameron Hoover wanted me to inquire if you would instead prefer or be OK with him being on single therapy with Brilinta instead of aspirin, then?   Thanks in advance.  Cameron Dunn PA-C  Cameron Hoover

## 2016-04-12 NOTE — Telephone Encounter (Signed)
Pt is aware.  

## 2016-04-14 ENCOUNTER — Other Ambulatory Visit: Payer: BLUE CROSS/BLUE SHIELD | Admitting: *Deleted

## 2016-04-14 DIAGNOSIS — I5042 Chronic combined systolic (congestive) and diastolic (congestive) heart failure: Secondary | ICD-10-CM

## 2016-04-14 DIAGNOSIS — I2510 Atherosclerotic heart disease of native coronary artery without angina pectoris: Secondary | ICD-10-CM

## 2016-04-14 DIAGNOSIS — K922 Gastrointestinal hemorrhage, unspecified: Secondary | ICD-10-CM

## 2016-04-14 LAB — CBC WITH DIFFERENTIAL/PLATELET
Basophils Absolute: 0 cells/uL (ref 0–200)
Basophils Relative: 0 %
EOS PCT: 3 %
Eosinophils Absolute: 207 cells/uL (ref 15–500)
HEMATOCRIT: 38.4 % — AB (ref 38.5–50.0)
Hemoglobin: 12.8 g/dL — ABNORMAL LOW (ref 13.2–17.1)
LYMPHS ABS: 2070 {cells}/uL (ref 850–3900)
LYMPHS PCT: 30 %
MCH: 30 pg (ref 27.0–33.0)
MCHC: 33.3 g/dL (ref 32.0–36.0)
MCV: 89.9 fL (ref 80.0–100.0)
MONO ABS: 621 {cells}/uL (ref 200–950)
MPV: 9.4 fL (ref 7.5–12.5)
Monocytes Relative: 9 %
NEUTROS PCT: 58 %
Neutro Abs: 4002 cells/uL (ref 1500–7800)
Platelets: 443 10*3/uL — ABNORMAL HIGH (ref 140–400)
RBC: 4.27 MIL/uL (ref 4.20–5.80)
RDW: 14.2 % (ref 11.0–15.0)
WBC: 6.9 10*3/uL (ref 3.8–10.8)

## 2016-04-26 ENCOUNTER — Telehealth: Payer: Self-pay | Admitting: Gastroenterology

## 2016-04-26 NOTE — Telephone Encounter (Signed)
Please call patient [phone]  Called inquiring about how long he is supposed to take the medication slf gave him after his procedure.  States they were for his stomach.

## 2016-04-27 NOTE — Telephone Encounter (Signed)
Per Dr. Darrick Penna procedure note, pt is to take Protonix 40 mg bid for 3 months and then once a day forever. Pt is aware and he needs new Rx sent in.  Sending to the refill box.

## 2016-05-03 ENCOUNTER — Telehealth: Payer: Self-pay

## 2016-05-03 MED ORDER — PANTOPRAZOLE SODIUM 40 MG PO TBEC: 40.0000 mg | DELAYED_RELEASE_TABLET | Freq: Two times a day (BID) | ORAL | 2 refills | Status: DC

## 2016-05-03 NOTE — Telephone Encounter (Signed)
PT is aware.

## 2016-05-03 NOTE — Telephone Encounter (Signed)
I sent in Protonix bid x 3 months (60 pills and 2 refills). When that's complete we can change it to once daily dosing, please call us when you complete the 3 months.

## 2016-05-03 NOTE — Telephone Encounter (Signed)
PT called and said his Rx for Protonix has not been sent to the pharmacy. Please see previous phone note.

## 2016-05-09 NOTE — Telephone Encounter (Signed)
PT called and said he has not gotten his Protonix. I called [facility] and spoke to [facility] and he said that it is just needs a PA. I told him to fax info to [place] and he said that he will. Raynelle Fanning is aware and expecting the PA.

## 2016-05-12 ENCOUNTER — Encounter: Payer: Self-pay | Admitting: Gastroenterology

## 2016-05-17 ENCOUNTER — Telehealth: Payer: Self-pay | Admitting: Gastroenterology

## 2016-05-17 NOTE — Telephone Encounter (Signed)
Pt called to make his 3 month follow up to set up his repeat EGD and is aware of OV date and time. He also said that he has been off his protonix for 3 weeks because of waiting on his PA with his Insurance. I told him that I would let the nurse be aware and follow up on that for him Please call [phone]

## 2016-05-18 NOTE — Telephone Encounter (Signed)
PA is done. Waiting for insurance response.

## 2016-05-19 NOTE — Telephone Encounter (Signed)
PA has been approved- tried to call pt- NA-LMOM with information.

## 2016-05-24 ENCOUNTER — Encounter: Payer: Self-pay | Admitting: Cardiology

## 2016-05-25 ENCOUNTER — Encounter: Payer: Self-pay | Admitting: Nurse Practitioner

## 2016-05-25 ENCOUNTER — Ambulatory Visit (INDEPENDENT_AMBULATORY_CARE_PROVIDER_SITE_OTHER): Payer: BLUE CROSS/BLUE SHIELD | Admitting: Nurse Practitioner

## 2016-05-25 ENCOUNTER — Other Ambulatory Visit: Payer: Self-pay

## 2016-05-25 VITALS — BP 127/66 | HR 69 | Temp 97.7°F | Ht 66.0 in | Wt 157.6 lb

## 2016-05-25 DIAGNOSIS — K922 Gastrointestinal hemorrhage, unspecified: Secondary | ICD-10-CM

## 2016-05-25 DIAGNOSIS — K253 Acute gastric ulcer without hemorrhage or perforation: Secondary | ICD-10-CM

## 2016-05-25 LAB — CBC WITH DIFFERENTIAL/PLATELET
BASOS PCT: 0 %
Basophils Absolute: 0 cells/uL (ref 0–200)
EOS ABS: 152 {cells}/uL (ref 15–500)
Eosinophils Relative: 2 %
HCT: 41.6 % (ref 38.5–50.0)
Hemoglobin: 13.9 g/dL (ref 13.2–17.1)
LYMPHS PCT: 26 %
Lymphs Abs: 1976 cells/uL (ref 850–3900)
MCH: 29.9 pg (ref 27.0–33.0)
MCHC: 33.4 g/dL (ref 32.0–36.0)
MCV: 89.5 fL (ref 80.0–100.0)
MONOS PCT: 8 %
MPV: 9.3 fL (ref 7.5–12.5)
Monocytes Absolute: 608 cells/uL (ref 200–950)
NEUTROS ABS: 4864 {cells}/uL (ref 1500–7800)
Neutrophils Relative %: 64 %
PLATELETS: 383 10*3/uL (ref 140–400)
RBC: 4.65 MIL/uL (ref 4.20–5.80)
RDW: 14.6 % (ref 11.0–15.0)
WBC: 7.6 10*3/uL (ref 3.8–10.8)

## 2016-05-25 NOTE — Patient Instructions (Signed)
1. Have your blood work drawn when you're able to. 2. We will schedule your procedure for you. 3. We will give you a copy of your prior authorization approval from the insurance company for Protonix twice a day. He can bring this to the pharmacy to have your medication refill. 4. Return for follow-up in 4 months.

## 2016-05-25 NOTE — Assessment & Plan Note (Signed)
The patient was admitted to the hospital recently with upper GI bleed and anemia requiring blood transfusion. He was found to have a pyloric ulcer. He was discharged home on twice a day Protonix which she was able to take for one month but then required a prolonged prior authorization from his insurance company and has not been on any Protonix for 3 weeks. It is since been approved as of a few days ago. We will give him a and then once a day forever. He is back on his Brilinta due to cardiac stenting 1 year ago. He has an upcoming cardiology appointment. We will schedule surveillance endoscopy as previously recommended to check status of the pyloric ulcer. Return for follow-up in 4 months  Proceed with EGD with Dr. Darrick Penna in near future: the risks, benefits, and alternatives have been discussed with the patient in detail. The patient states understanding and desires to proceed.  The patient is currently on Brilinta (similar to Plavix). No longer on aspirin. He is also on Vicodin as needed. No other anticoagulants, anxiolytics, chronic pain medications, or antidepressants. We will add 25 mg preprocedure Phenergan to promote adequate sedation.

## 2016-05-25 NOTE — Progress Notes (Signed)
cc'ed to pcp °

## 2016-05-25 NOTE — Progress Notes (Addendum)
REVIEWED-NO ADDITIONAL RECOMMENDATIONS.  Referring Provider: No ref. provider found Primary Care Physician:  Alice Reichert, MD Primary GI:  Dr. Darrick Penna  Chief Complaint  Patient presents with  . EGD    HPI:   Cameron Hoover is a 60 y.o. male who presents for post procedure and posthospitalization follow-up. The patient was admitted to the hospital 03/19/2016 through 03/21/2016 for upper GI bleed with anemia. He initially presented with epigastric pain and found to have a hemoglobin of 10 with a baseline of 40. Noted black stools over the past week, heme positive in the emergency room. Is on chronic dual antiplatelet therapy with Brilinta and aspirin. Received 1 unit PRBCs. The patient underwent upper endoscopy on 03/21/2016 which found a nonbleeding pyloric channel ulcer with mild erosive gastritis and duodenitis. Recommended resume aspirin 03/24/2016, continue to hold Brilinta pending discussion with cardiology. Recommended surveillance endoscopy in November 2017, Protonix twice a day for 3 months then once a day forever.  Most recent CBC dated 04/14/2016 which showed significant improvement in hemoglobin to 12.8.  Today he states he's doing ok overall. Frustrated by prior [place] situation with Protonix and his Insurance because he's been without Protonix for about 3 weeks now. From phone notes appears PA was just recently approved. Denies abdominal pain, N/V, hematochezia. No further black stools. Denies chest pain, dyspnea, dizziness, lightheadedness, syncope, near syncope. Denies any other upper or lower GI symptoms.  He has been put back on Brilenta (is supposed to stay on it for 1 year due to coronary stenting Oct 2016). Follow-up appointment with Cardiology is coming up. Is NOT taking ASA today. Denies NSAIDs and ASA powders.   Past Medical History:  Diagnosis Date  . Acute blood loss anemia 03/2016  . Bradycardia    a. not on BB due to this.  . Chronic combined systolic and  diastolic CHF (congestive heart failure) (HCC)   . Coronary artery disease    a. previously nonobstructive. b. then severe dyspnea/acute CHF 05/2015 s/p DES to mLAD.  Marland Kitchen Duodenitis 03/2016  . Gastritis 03/2016  . GI bleed    a. 03/2016 with ABL anemia - (gastritis/duodenitis by EGD).  . Hyperlipidemia     Past Surgical History:  Procedure Laterality Date  . CARDIAC CATHETERIZATION  2007  . CARDIAC CATHETERIZATION N/A 05/28/2015   Procedure: Left Heart Cath and Cors/Grafts Angiography;  Surgeon: Lennette Bihari, MD;  Location: [facility] INVASIVE CV LAB;  Service: Cardiovascular;  Laterality: N/A;  . CARDIAC CATHETERIZATION N/A 05/28/2015   Procedure: Coronary Stent Intervention;  Surgeon: Lennette Bihari, MD;  Location: [facility] INVASIVE CV LAB;  Service: Cardiovascular;  Laterality: N/A;  . ESOPHAGOGASTRODUODENOSCOPY N/A 03/21/2016   Procedure: ESOPHAGOGASTRODUODENOSCOPY (EGD);  Surgeon: West Bali, MD;  Location: [facility] ENDO SUITE;  Service: Endoscopy;  Laterality: N/A;    Current Outpatient Prescriptions  Medication Sig Dispense Refill  . atorvastatin (LIPITOR) 80 MG tablet Take 1 tablet (80 mg total) by mouth daily at 6 PM. 90 tablet 3  . furosemide (LASIX) 20 MG tablet Take 20 mg by mouth daily.   2  . HYDROcodone-acetaminophen (NORCO/VICODIN) 5-325 MG tablet Take 1 tablet by mouth as needed.  0  . lisinopril (PRINIVIL,ZESTRIL) 5 MG tablet Take 1 tablet (5 mg total) by mouth daily. 30 tablet 11  . metoprolol succinate (TOPROL-XL) 25 MG 24 hr tablet TAKE 1 TABLET (25 MG TOTAL) BY MOUTH DAILY. 30 tablet 6  . ticagrelor (BRILINTA) 90 MG TABS tablet Take 1 tablet (90 mg  total) by mouth 2 (two) times daily. 180 tablet 3  . Vitamin D, Ergocalciferol, (DRISDOL) 50000 units CAPS capsule Take 50,000 Units by mouth once a week. Takes on Monday  0  . pantoprazole (PROTONIX) 40 MG tablet Take 1 tablet (40 mg total) by mouth 2 (two) times daily before a meal. (Patient not taking: Reported on 05/25/2016) 60 tablet  2   No current facility-administered medications for this visit.     Allergies as of 05/25/2016  . (No Known Allergies)    Family History  Problem Relation Age of Onset  . Cancer Father   . Emphysema Father   . Heart attack Brother   . Heart attack Brother   . Cancer Mother   . Diabetes Mother   . Heart attack Cousin 40    Social History   Social History  . Marital status: Legally Separated    Spouse name: N/A  . Number of children: N/A  . Years of education: N/A   Social History Main Topics  . Smoking status: Current Every Day Smoker    Packs/day: 1.00    Years: 46.00    Types: Cigarettes  . Smokeless tobacco: Never Used  . Alcohol use 8.4 oz/week    14 Cans of beer per week  . Drug use: No  . Sexual activity: Not Asked   Other Topics Concern  . None   Social History Narrative  . None    Review of Systems: Complete ROS negative except as per HPI.   Physical Exam: BP 127/66   Pulse 69   Temp 97.7 F (36.5 C) (Oral)   Ht 5\' 6"  (1.676 m)   Wt 157 lb 9.6 oz (71.5 kg)   BMI 25.44 kg/m  General:   Alert and oriented. Pleasant and cooperative. Well-nourished and well-developed.  Eyes:  Without icterus, sclera clear and conjunctiva pink.  Ears:  Normal auditory acuity. Cardiovascular:  S1, S2 present without murmurs appreciated. Extremities without clubbing or edema. Respiratory:  Clear to auscultation bilaterally. No wheezes, rales, or rhonchi. No distress.  Gastrointestinal:  +BS, soft, non-tender and non-distended. No HSM noted. No guarding or rebound. No masses appreciated.  Rectal:  Deferred  Musculoskalatal:  Symmetrical without gross deformities. Neurologic:  Alert and oriented x4;  grossly normal neurologically. Psych:  Alert and cooperative. Normal mood and affect. Heme/Lymph/Immune: No excessive bruising noted.    05/25/2016 12:09 PM   Disclaimer: This note was dictated with voice recognition software. Similar sounding words can  inadvertently be transcribed and may not be corrected upon review.

## 2016-05-25 NOTE — Assessment & Plan Note (Signed)
No further evidence of upper GI bleed. Specifically, denies melena, hematemesis. Hemoglobin appears much improved. I'll recheck a CBC today as his last one was 1-1/2-2 months ago. We will also schedule surveillance endoscopy as noted below. Return for follow-up in 4 months.

## 2016-05-30 NOTE — Progress Notes (Signed)
I called and LMOM that lab was normal, keep appt for procedure and call if questions.

## 2016-06-03 ENCOUNTER — Ambulatory Visit (INDEPENDENT_AMBULATORY_CARE_PROVIDER_SITE_OTHER): Payer: BLUE CROSS/BLUE SHIELD | Admitting: Cardiology

## 2016-06-03 ENCOUNTER — Encounter: Payer: Self-pay | Admitting: Cardiology

## 2016-06-03 VITALS — BP 120/62 | HR 68 | Ht 66.0 in | Wt 161.0 lb

## 2016-06-03 DIAGNOSIS — I10 Essential (primary) hypertension: Secondary | ICD-10-CM | POA: Diagnosis not present

## 2016-06-03 DIAGNOSIS — E782 Mixed hyperlipidemia: Secondary | ICD-10-CM

## 2016-06-03 DIAGNOSIS — E784 Other hyperlipidemia: Secondary | ICD-10-CM

## 2016-06-03 DIAGNOSIS — D62 Acute posthemorrhagic anemia: Secondary | ICD-10-CM

## 2016-06-03 DIAGNOSIS — I2510 Atherosclerotic heart disease of native coronary artery without angina pectoris: Secondary | ICD-10-CM

## 2016-06-03 DIAGNOSIS — E7849 Other hyperlipidemia: Secondary | ICD-10-CM

## 2016-06-03 DIAGNOSIS — I214 Non-ST elevation (NSTEMI) myocardial infarction: Secondary | ICD-10-CM

## 2016-06-03 MED ORDER — ASPIRIN EC 81 MG PO TBEC: 81.0000 mg | DELAYED_RELEASE_TABLET | Freq: Every day | ORAL | 3 refills | Status: DC

## 2016-06-03 NOTE — Progress Notes (Signed)
Cardiology Office Note    Date:  06/03/2016  ID:  Cameron Hoover, DOB 04-20-1956, MRN [id number] PCP:  Alice Reichert, MD  Cardiologist:  Delton See  Chief Complaint: f/u GIB  History of Present Illness:  Cameron Hoover is a 60 y.o. male with history of CAD (previously nonobstructive, then severe dyspnea/acute CHF 05/2015 s/p DES to mLAD), bradycardia (not on BB due to this), chronic combined CHF (EF 35-40% in 08/2015), hyperlipidemia, UGIB 03/2016 (gastritis/duodenitis) who presents for f/u. Last 2D echo 08/2015: EF 35%, grade 1 DD, akinesis of inferolat myocardium.  Admitted 8/19-8/21/17 with epigastric pain, black stools, and ABL anemia (Hgb 10, baseline around 14), +FOBT, txfused 1 U PRBC. Troponins negative. Pt underwent EGD on 8/21 with findings of one nonbleeding pyloric channel ulcer with mild erosive gastritis and duodenitis. GI felt this was due to aspirin use. ASA/Brilinta were initially held - ASA resumed on 03/24/16 with recommendation to f/u here to discuss Brilinta. On PPI per GI as well. Most recent Cr 0.87, albumin 2.8, Hgb 10.1.  06/03/2016 - the patient is coming after, he denies any further bleeding, currently on aspirin and Brilinta, he has no chest pain but occasional shortness of breath that has been stable. He has another upper endoscopy scheduled with Dr. Darrick Penna on 06/20/2016. He continues to experience significant right lower extremity pain and lower back pain. Denies any palpitations or syncope.   Past Medical History:  Diagnosis Date  . Acute blood loss anemia 03/2016  . Bradycardia    a. not on BB due to this.  . Chronic combined systolic and diastolic CHF (congestive heart failure) (HCC)   . Coronary artery disease    a. previously nonobstructive. b. then severe dyspnea/acute CHF 05/2015 s/p DES to mLAD.  Marland Kitchen Duodenitis 03/2016  . Gastritis 03/2016  . GI bleed    a. 03/2016 with ABL anemia - (gastritis/duodenitis by EGD).  . Hyperlipidemia     Past Surgical History:    Procedure Laterality Date  . CARDIAC CATHETERIZATION  2007  . CARDIAC CATHETERIZATION N/A 05/28/2015   Procedure: Left Heart Cath and Cors/Grafts Angiography;  Surgeon: Lennette Bihari, MD;  Location: [facility] INVASIVE CV LAB;  Service: Cardiovascular;  Laterality: N/A;  . CARDIAC CATHETERIZATION N/A 05/28/2015   Procedure: Coronary Stent Intervention;  Surgeon: Lennette Bihari, MD;  Location: [facility] INVASIVE CV LAB;  Service: Cardiovascular;  Laterality: N/A;  . ESOPHAGOGASTRODUODENOSCOPY N/A 03/21/2016   Procedure: ESOPHAGOGASTRODUODENOSCOPY (EGD);  Surgeon: West Bali, MD;  Location: [facility] ENDO [facility];  Service: Endoscopy;  Laterality: N/A;    Current Medications: Current Outpatient Prescriptions  Medication Sig Dispense Refill  . atorvastatin (LIPITOR) 80 MG tablet Take 1 tablet (80 mg total) by mouth daily at 6 PM. 90 tablet 3  . furosemide (LASIX) 20 MG tablet Take 20 mg by mouth daily.   2  . gabapentin (NEURONTIN) 100 MG capsule TAKE 1 CAPSULE BY MOUTH AT BEDTIME, INCREASE BY 1 CAP WEEKLY(ON OFF WORKDAYS) TO 1 CAP 3X DAILY  2  . HYDROcodone-acetaminophen (NORCO/VICODIN) 5-325 MG tablet Take 1 tablet by mouth as needed.  0  . lisinopril (PRINIVIL,ZESTRIL) 5 MG tablet Take 1 tablet (5 mg total) by mouth daily. 30 tablet 11  . metoprolol succinate (TOPROL-XL) 25 MG 24 hr tablet TAKE 1 TABLET (25 MG TOTAL) BY MOUTH DAILY. 30 tablet 6  . pantoprazole (PROTONIX) 40 MG tablet Take 1 tablet (40 mg total) by mouth 2 (two) times daily before a meal. 60 tablet 2  .  ticagrelor (BRILINTA) 90 MG TABS tablet Take 1 tablet (90 mg total) by mouth 2 (two) times daily. 180 tablet 3  . Vitamin D, Ergocalciferol, (DRISDOL) 50000 units CAPS capsule Take 50,000 Units by mouth once a week. Takes on Monday  0   No current facility-administered medications for this visit.      Allergies:   Review of patient's allergies indicates no known allergies.   Social History   Social History  . Marital status: Significant  Other    Spouse name: N/A  . Number of children: N/A  . Years of education: N/A   Social History Main Topics  . Smoking status: Current Every Day Smoker    Packs/day: 1.00    Years: 46.00    Types: Cigarettes  . Smokeless tobacco: Never Used  . Alcohol use 8.4 oz/week    14 Cans of beer per week  . Drug use: No  . Sexual activity: Not Asked   Other Topics Concern  . None   Social History Narrative  . None     Family History:  The patient's family history includes Cancer in his father and mother; Diabetes in his mother; Emphysema in his father; Heart attack in his brother and brother; Heart attack (age of onset: 8640) in his cousin.   ROS:   Please see the history of present illness.   All other systems are reviewed and otherwise negative.    PHYSICAL EXAM:   VS:  BP 120/62   Pulse 68   Ht 5\' 6"  (1.676 m)   Wt 161 lb (73 kg)   BMI 25.99 kg/m   BMI: Body mass index is 25.99 kg/m. GEN: Well nourished, well developed AAM, in no acute distress  HEENT: normocephalic, atraumatic Neck: no JVD, carotid bruits, or masses Cardiac: RRR; no murmurs, rubs, or gallops, no edema, 1+ pedal pulses bilaterally Respiratory:  clear to auscultation bilaterally, normal work of breathing GI: soft, nontender, nondistended, + BS MS: no deformity or atrophy  Skin: warm and dry, no rash Neuro:  Alert and Oriented x 3, Strength and sensation are intact, follows commands Psych: euthymic mood, full affect  Wt Readings from Last 3 Encounters:  06/03/16 161 lb (73 kg)  05/25/16 157 lb 9.6 oz (71.5 kg)  03/30/16 158 lb (71.7 kg)    Studies/Labs Reviewed:   EKG:  EKG was not ordered today.  Recent Labs: 03/20/2016: ALT 21; BUN 12; Creatinine, Ser 0.87; Potassium 4.3; Sodium 135 05/25/2016: Hemoglobin 13.9; Platelets 383   Lipid Panel No results found for: CHOL, TRIG, HDL, CHOLHDL, VLDL, LDLCALC, LDLDIRECT  Additional studies/ records that were reviewed today include: Summarized  above.    ASSESSMENT & PLAN:   1. CAD - stable from cardiac standpoint but recent interruption in ASA/Brilinta.  Now it is exactly year since he obtained his stent and therefore we will discontinue Brilinta, and continue aspirin only. His most recent hemoglobin was 13.9 and he schedule for EGD on 06/20/2016.  2. ABL anemia/UGI bleed - see above. It also appears the patient reported drinking 14 cans of beer a week and was instructed to refrain from this as alcohol can contribute to recurrent GIB.  3. Chronic combined CHF - appears euvolemic. Continue BB/ACEI.  4. Leg pain - somewhat atypical for claudication he is bilateral lower extremity arterial ultrasound was normal also his pulses are normal today this is most probably related to discopathy. He is advised to follow with his spinal or surgeon after EGD and being cleared  by GI.  At this point if he needed to undergo spinal surgery he would be cleared from cardiac standpoint.  Follow-up in 3 months.  Medication Adjustments/Labs and Tests Ordered: Current medicines are reviewed at length with the patient today.  Concerns regarding medicines are outlined above. Medication changes, Labs and Tests ordered today are summarized above and listed in the Patient Instructions accessible in Encounters.   Signed, Tobias Alexander, MD 06/03/2016 9:06 AM    [facility] [address] [place], [place], [place]  [address] Phone: [phone]; Fax: [phone]

## 2016-06-03 NOTE — Patient Instructions (Signed)
Medication Instructions:   STOP TAKING BRILINTA NOW  START TAKING ASPIRIN 81 MG ONCE DAILY    Follow-Up:  3 MONTHS WITH DR Delton See       If you need a refill on your cardiac medications before your next appointment, please call your pharmacy.

## 2016-06-20 ENCOUNTER — Ambulatory Visit (HOSPITAL_COMMUNITY)
Admission: RE | Admit: 2016-06-20 | Discharge: 2016-06-20 | Disposition: A | Discharge status: Home / Self Care | Payer: BLUE CROSS/BLUE SHIELD | Source: Ambulatory Visit | Attending: Gastroenterology | Admitting: Gastroenterology

## 2016-06-20 ENCOUNTER — Encounter (HOSPITAL_COMMUNITY)
Admission: RE | Disposition: A | Discharge status: Home / Self Care | Payer: Self-pay | Source: Ambulatory Visit | Attending: Gastroenterology

## 2016-06-20 ENCOUNTER — Encounter (HOSPITAL_COMMUNITY): Payer: Self-pay | Admitting: *Deleted

## 2016-06-20 DIAGNOSIS — F17210 Nicotine dependence, cigarettes, uncomplicated: Secondary | ICD-10-CM | POA: Insufficient documentation

## 2016-06-20 DIAGNOSIS — E785 Hyperlipidemia, unspecified: Secondary | ICD-10-CM | POA: Diagnosis not present

## 2016-06-20 DIAGNOSIS — I5042 Chronic combined systolic (congestive) and diastolic (congestive) heart failure: Secondary | ICD-10-CM | POA: Insufficient documentation

## 2016-06-20 DIAGNOSIS — Z955 Presence of coronary angioplasty implant and graft: Secondary | ICD-10-CM | POA: Diagnosis not present

## 2016-06-20 DIAGNOSIS — Z09 Encounter for follow-up examination after completed treatment for conditions other than malignant neoplasm: Secondary | ICD-10-CM | POA: Insufficient documentation

## 2016-06-20 DIAGNOSIS — I2510 Atherosclerotic heart disease of native coronary artery without angina pectoris: Secondary | ICD-10-CM | POA: Diagnosis not present

## 2016-06-20 DIAGNOSIS — K2970 Gastritis, unspecified, without bleeding: Secondary | ICD-10-CM

## 2016-06-20 DIAGNOSIS — Z8711 Personal history of peptic ulcer disease: Secondary | ICD-10-CM | POA: Insufficient documentation

## 2016-06-20 DIAGNOSIS — Z7982 Long term (current) use of aspirin: Secondary | ICD-10-CM | POA: Insufficient documentation

## 2016-06-20 DIAGNOSIS — K2950 Unspecified chronic gastritis without bleeding: Secondary | ICD-10-CM | POA: Insufficient documentation

## 2016-06-20 DIAGNOSIS — Z79899 Other long term (current) drug therapy: Secondary | ICD-10-CM | POA: Insufficient documentation

## 2016-06-20 DIAGNOSIS — K253 Acute gastric ulcer without hemorrhage or perforation: Secondary | ICD-10-CM

## 2016-06-20 DIAGNOSIS — K922 Gastrointestinal hemorrhage, unspecified: Secondary | ICD-10-CM

## 2016-06-20 HISTORY — PX: ESOPHAGOGASTRODUODENOSCOPY: SHX5428

## 2016-06-20 SURGERY — EGD (ESOPHAGOGASTRODUODENOSCOPY)
Anesthesia: Moderate Sedation

## 2016-06-20 MED ORDER — PANTOPRAZOLE SODIUM 40 MG PO TBEC: 40.0000 mg | DELAYED_RELEASE_TABLET | Freq: Every day | ORAL | 3 refills | Status: DC

## 2016-06-20 MED ORDER — SODIUM CHLORIDE 0.9 % IV SOLN
INTRAVENOUS | Status: DC
  Administered 2016-06-20: 13:00:00 via INTRAVENOUS

## 2016-06-20 MED ORDER — MEPERIDINE HCL 100 MG/ML IJ SOLN
INTRAMUSCULAR | Status: DC | PRN
  Administered 2016-06-20 (×2): 25 mg via INTRAVENOUS

## 2016-06-20 MED ORDER — PROMETHAZINE HCL 25 MG/ML IJ SOLN
INTRAMUSCULAR | Status: AC
  Filled 2016-06-20: qty 1

## 2016-06-20 MED ORDER — MIDAZOLAM HCL 5 MG/5ML IJ SOLN
INTRAMUSCULAR | Status: AC
  Filled 2016-06-20: qty 10

## 2016-06-20 MED ORDER — SODIUM CHLORIDE 0.9% FLUSH
INTRAVENOUS | Status: AC
  Filled 2016-06-20: qty 10

## 2016-06-20 MED ORDER — MIDAZOLAM HCL 5 MG/5ML IJ SOLN
INTRAMUSCULAR | Status: DC | PRN
  Administered 2016-06-20: 2 mg via INTRAVENOUS
  Administered 2016-06-20: 1 mg via INTRAVENOUS
  Administered 2016-06-20: 2 mg via INTRAVENOUS

## 2016-06-20 MED ORDER — LIDOCAINE VISCOUS 2 % MT SOLN
OROMUCOSAL | Status: DC | PRN
  Administered 2016-06-20: 1 via OROMUCOSAL

## 2016-06-20 MED ORDER — LIDOCAINE VISCOUS 2 % MT SOLN
OROMUCOSAL | Status: AC
  Filled 2016-06-20: qty 15

## 2016-06-20 MED ORDER — PROMETHAZINE HCL 25 MG/ML IJ SOLN
25.0000 mg | Freq: Once | INTRAMUSCULAR | Status: AC
  Administered 2016-06-20: 25 mg via INTRAVENOUS

## 2016-06-20 MED ORDER — STERILE WATER FOR IRRIGATION IR SOLN
Status: DC | PRN
  Administered 2016-06-20: 14:00:00

## 2016-06-20 MED ORDER — MEPERIDINE HCL 100 MG/ML IJ SOLN
INTRAMUSCULAR | Status: AC
Start: 2016-06-20 — End: 2016-06-21
  Filled 2016-06-20: qty 2

## 2016-06-20 NOTE — H&P (Signed)
Primary Care Physician:  Alice Reichert, MD Primary Gastroenterologist:  Dr. Darrick Penna  Pre-Procedure History & Physical: HPI:  Cameron Hoover is a 60 y.o. male here for pud on asa/brilinta complicated by GI bleed.  Past Medical History:  Diagnosis Date  . Acute blood loss anemia 03/2016  . Bradycardia    a. not on BB due to this.  . Chronic combined systolic and diastolic CHF (congestive heart failure) (HCC)   . Coronary artery disease    a. previously nonobstructive. b. then severe dyspnea/acute CHF 05/2015 s/p DES to mLAD.  Marland Kitchen Duodenitis 03/2016  . Gastritis 03/2016  . GI bleed    a. 03/2016 with ABL anemia - (gastritis/duodenitis by EGD).  . Hyperlipidemia     Past Surgical History:  Procedure Laterality Date  . CARDIAC CATHETERIZATION  2007  . CARDIAC CATHETERIZATION N/A 05/28/2015   Procedure: Left Heart Cath and Cors/Grafts Angiography;  Surgeon: Lennette Bihari, MD;  Location: [facility] INVASIVE CV LAB;  Service: Cardiovascular;  Laterality: N/A;  . CARDIAC CATHETERIZATION N/A 05/28/2015   Procedure: Coronary Stent Intervention;  Surgeon: Lennette Bihari, MD;  Location: [facility] INVASIVE CV LAB;  Service: Cardiovascular;  Laterality: N/A;  . ESOPHAGOGASTRODUODENOSCOPY N/A 03/21/2016   Procedure: ESOPHAGOGASTRODUODENOSCOPY (EGD);  Surgeon: West Bali, MD;  Location: [facility] ENDO SUITE;  Service: Endoscopy;  Laterality: N/A;    Prior to Admission medications   Medication Sig Start Date End Date Taking? Authorizing Provider  aspirin EC 81 MG tablet Take 1 tablet (81 mg total) by mouth daily. 06/03/16  Yes Lars Masson, MD  atorvastatin (LIPITOR) 80 MG tablet Take 1 tablet (80 mg total) by mouth daily at 6 PM. 07/01/15  Yes Lars Masson, MD  furosemide (LASIX) 20 MG tablet Take 20 mg by mouth daily.  03/27/16  Yes Historical Provider, MD  gabapentin (NEURONTIN) 100 MG capsule Take 100-300 mg by mouth as directed. TAKE 1 CAPSULE AT BEDTIME Monday THROUGH Friday & 3 CAPSULES AT BEDTIME ON  Saturday AND Sunday. 05/23/16  Yes Historical Provider, MD  HYDROcodone-acetaminophen (NORCO/VICODIN) 5-325 MG tablet Take 1 tablet by mouth every 6 (six) hours as needed (for pain.).  05/23/16  Yes Historical Provider, MD  lisinopril (PRINIVIL,ZESTRIL) 5 MG tablet Take 1 tablet (5 mg total) by mouth daily. 11/23/15  Yes Lars Masson, MD  metoprolol succinate (TOPROL-XL) 25 MG 24 hr tablet TAKE 1 TABLET (25 MG TOTAL) BY MOUTH DAILY. 03/28/16  Yes Lars Masson, MD  pantoprazole (PROTONIX) 40 MG tablet Take 1 tablet (40 mg total) by mouth 2 (two) times daily before a meal. 05/03/16  Yes Anice Paganini, NP  Vitamin D, Ergocalciferol, (DRISDOL) 50000 units CAPS capsule Take 50,000 Units by mouth every Monday.  11/13/15  Yes Historical Provider, MD    Allergies as of 05/25/2016  . (No Known Allergies)    Family History  Problem Relation Age of Onset  . Cancer Father   . Emphysema Father   . Heart attack Brother   . Heart attack Brother   . Cancer Mother   . Diabetes Mother   . Heart attack Cousin 40    Social History   Social History  . Marital status: Significant Other    Spouse name: N/A  . Number of children: N/A  . Years of education: N/A   Occupational History  . Not on file.   Social History Main Topics  . Smoking status: Current Every Day Smoker    Packs/day: 1.00  Years: 46.00    Types: Cigarettes  . Smokeless tobacco: Never Used  . Alcohol use 8.4 oz/week    14 Cans of beer per week  . Drug use: No  . Sexual activity: Not on file   Other Topics Concern  . Not on file   Social History Narrative  . No narrative on file    Review of Systems: See HPI, otherwise negative ROS   Physical Exam: BP 130/69   Pulse 64   Temp 98.3 F (36.8 C)   Resp 14   Ht 5\' 6"  (1.676 m)   Wt 161 lb (73 kg)   SpO2 100%   BMI 25.99 kg/m  General:   Alert,  pleasant and cooperative in NAD Head:  Normocephalic and atraumatic. Neck:  Supple; Lungs:  Clear throughout to  auscultation.    Heart:  Regular rate and rhythm. Abdomen:  Soft, nontender and nondistended. Normal bowel sounds, without guarding, and without rebound.   Neurologic:  Alert and  oriented x4;  grossly normal neurologically.  Impression/Plan:    PUD  PLAN:  REPEAT EGD TO CONFIRM ULCERS ARE HEALED.

## 2016-06-20 NOTE — Op Note (Signed)
[facility] Patient Name: Cameron FellingBruce Hoover Procedure Date: 06/20/2016 2:12 PM MRN: [id number] Date of Birth: 02/18/1956 Attending MD: Jonette EvaSandi Canuto Kingston , MD CSN: [id number] Age: [id number] Admit Type: Outpatient Procedure:                Upper GI endoscopy WITH COLD FORCEPS BIOPSY Indications:              Personal history of peptic ulcer disease Providers:                Jonette EvaSandi Geovannie Vilar, MD, Jannett CelestineAnitra Bell, RN, Birder Robsonebra Houghton,                            Technician Referring MD:             Lytle MichaelsJames Y. Kim Medicines:                Meperidine 50 mg IV, Midazolam 5 mg IV Complications:            No immediate complications. Estimated Blood Loss:     Estimated blood loss was minimal. Procedure:                Pre-Anesthesia Assessment:                           - Prior to the procedure, a History and Physical                            was performed, and patient medications and                            allergies were reviewed. The patient's tolerance of                            previous anesthesia was also reviewed. The risks                            and benefits of the procedure and the sedation                            options and risks were discussed with the patient.                            All questions were answered, and informed consent                            was obtained. Prior Anticoagulants: The patient has                            taken aspirin, last dose was day of procedure. ASA                            Grade Assessment: II - A patient with mild systemic                            disease. After reviewing the risks and benefits,  the patient was deemed in satisfactory condition to                            undergo the procedure. After obtaining informed                            consent, the endoscope was passed under direct                            vision. Throughout the procedure, the patient's                            blood pressure, pulse,  and oxygen saturations were                            monitored continuously. The [id number] ([id number])                            scope was introduced through the mouth, and                            advanced to the second part of duodenum. The upper                            GI endoscopy was accomplished without difficulty.                            The patient tolerated the procedure well. Scope In: 2:34:47 PM Scope Out: 2:42:16 PM Total Procedure Duration: 0 hours 7 minutes 29 seconds  Findings:      The examined esophagus was normal.      Scattered mild inflammation characterized by congestion (edema) and       erythema was found in the gastric antrum. Biopsies were taken with a       cold forceps for Helicobacter pylori testing. NU ULCER IN PYLORIC       CHANNEL.      The examined duodenum was normal. Impression:               - Normal esophagus.                           - Gastritis. Biopsied.                           - Normal examined duodenum. Moderate Sedation:      Moderate (conscious) sedation was administered by the endoscopy nurse       and supervised by the endoscopist. The following parameters were       monitored: oxygen saturation, heart rate, blood pressure, and response       to care. Total physician intraservice time was 20 minutes. Recommendation:           - Low fat diet.                           - Continue present medications.                           -  Await pathology results.                           - Return to my office in 6 months.                           - Patient has a contact number available for                            emergencies. The signs and symptoms of potential                            delayed complications were discussed with the                            patient. Return to normal activities tomorrow.                            Written discharge instructions were provided to the                            patient. Procedure  Code(s):        --- Professional ---                           [phone], Esophagogastroduodenoscopy, flexible,                            transoral; with biopsy, single or multiple                           99152, Moderate sedation services provided by the                            same physician or other qualified health care                            professional performing the diagnostic or                            therapeutic service that the sedation supports,                            requiring the presence of an independent trained                            observer to assist in the monitoring of the                            patient's level of consciousness and physiological                            status; initial 15 minutes of intraservice time,  patient age 85 years or older Diagnosis Code(s):        --- Professional ---                           K29.70, Gastritis, unspecified, without bleeding                           Z87.11, Personal history of peptic ulcer disease CPT copyright 2016 [employer] Association. All rights reserved. The codes documented in this report are preliminary and upon coder review may  be revised to meet current compliance requirements. Jonette Eva, MD Jonette Eva, MD 06/20/2016 2:54:37 PM This report has been signed electronically. Number of Addenda: 0

## 2016-06-20 NOTE — Discharge Instructions (Signed)
Your ulcer is healed. You have mild gastritis. I biopsied your stomach.   FOLLOW A LOW FAT DIET. MEATS SHOULD BE BAKED, BROILED, OR BOILED. Avoid fried foods. SEE INFO BELOW.  CONTINUE PROTONIX. Reduce dose. TAKE 30 MINUTES PRIOR TO BREAKFAST.  FOLLOW UP IN 4 MOS.  UPPER ENDOSCOPY AFTER CARE Read the instructions outlined below and refer to this sheet in the next week. These discharge instructions provide you with general information on caring for yourself after you leave the hospital. While your treatment has been planned according to the most current medical practices available, unavoidable complications occasionally occur. If you have any problems or questions after discharge, call DR. Ngoc Daughtridge, [phone].  ACTIVITY  You may resume your regular activity, but move at a slower pace for the next 24 hours.   Take frequent rest periods for the next 24 hours.   Walking will help get rid of the air and reduce the bloated feeling in your belly (abdomen).   No driving for 24 hours (because of the medicine (anesthesia) used during the test).   You may shower.   Do not sign any important legal documents or operate any machinery for 24 hours (because of the anesthesia used during the test).    NUTRITION  Drink plenty of fluids.   You may resume your normal diet as instructed by your doctor.   Begin with a light meal and progress to your normal diet. Heavy or fried foods are harder to digest and may make you feel sick to your stomach (nauseated).   Avoid alcoholic beverages for 24 hours or as instructed.    MEDICATIONS  You may resume your normal medications.   WHAT YOU CAN EXPECT TODAY  Some feelings of bloating in the abdomen.   Passage of more gas than usual.    IF YOU HAD A BIOPSY TAKEN DURING THE UPPER ENDOSCOPY:  Eat a soft diet IF YOU HAVE NAUSEA, BLOATING, ABDOMINAL PAIN, OR VOMITING.    FINDING OUT THE RESULTS OF YOUR TEST Not all test results are available  during your visit. DR. Darrick Penna WILL CALL YOU WITHIN 14 DAYS OF YOUR PROCEDUE WITH YOUR RESULTS. Do not assume everything is normal if you have not heard from DR. Muneer Leider, CALL HER OFFICE AT [phone].  SEEK IMMEDIATE MEDICAL ATTENTION AND CALL THE OFFICE: [phone] IF:  You have more than a spotting of blood in your stool.   Your belly is swollen (abdominal distention).   You are nauseated or vomiting.   You have a temperature over 101F.   You have abdominal pain or discomfort that is severe or gets worse throughout the day.   Gastritis  Gastritis is an inflammation (the body's way of reacting to injury and/or infection) of the stomach. It is often caused by viral or bacterial (germ) infections. It can also be caused BY ASPIRIN, BC/GOODY POWDER'S, (IBUPROFEN) MOTRIN, OR ALEVE (NAPROXEN), chemicals (including alcohol), SPICY FOODS, and medications. This illness may be associated with generalized malaise (feeling tired, not well), UPPER ABDOMINAL STOMACH cramps, and fever. One common bacterial cause of gastritis is an organism known as H. Pylori. This can be treated with antibiotics.    Low-Fat Diet  BREADS, CEREALS, PASTA, RICE, DRIED PEAS, AND BEANS These products are high in carbohydrates and most are low in fat. Therefore, they can be increased in the diet as substitutes for fatty foods. They too, however, contain calories and should not be eaten in excess. Cereals can be eaten for snacks as well as  for breakfast.  Include foods that contain fiber (fruits, vegetables, whole grains, and legumes). Research shows that fiber may lower blood cholesterol levels, especially the water-soluble fiber found in fruits, vegetables, oat products, and legumes.  FRUITS AND VEGETABLES It is good to eat fruits and vegetables. Besides being sources of fiber, both are rich in vitamins and some minerals. They help you get the daily allowances of these nutrients. Fruits and vegetables can be used for  snacks and desserts.  MEATS Limit lean meat, chicken, [place]key, and fish to no more than 6 ounces per day.  Beef, Pork, and Lamb Use lean cuts of beef, pork, and lamb. Lean cuts include:  Extra-lean ground beef.  Arm roast.  Sirloin tip.  Center-cut ham.  Round steak.  Loin chops.  Rump roast.  Tenderloin.  Trim all fat off the outside of meats before cooking. It is not necessary to severely decrease the intake of red meat, but lean choices should be made. Lean meat is rich in protein and contains a highly absorbable form of iron. Premenopausal women, in particular, should avoid reducing lean red meat because this could increase the risk for low red blood cells (iron-deficiency anemia).  Chicken and [place]key These are good sources of protein. The fat of poultry can be reduced by removing the skin and underlying fat layers before cooking. Chicken and [place] can be substituted for lean red meat in the diet. Poultry should not be fried or covered with high-fat sauces.  Fish and Shellfish Fish is a good source of protein. Shellfish contain cholesterol, but they usually are low in saturated fatty acids. The preparation of fish is important. Like chicken and [place]key, they should not be fried or covered with high-fat sauces.  EGGS Egg whites contain no fat or cholesterol. They can be eaten often. Try 1 to 2 egg whites instead of whole eggs in recipes or use egg substitutes that do not contain yolk.  MILK AND DAIRY PRODUCTS Use skim or 1% milk instead of 2% or whole milk. Decrease whole milk, natural, and processed cheeses. Use nonfat or low-fat (2%) cottage cheese or low-fat cheeses made from vegetable oils. Choose nonfat or low-fat (1 to 2%) yogurt. Experiment with evaporated skim milk in recipes that call for heavy cream. Substitute low-fat yogurt or low-fat cottage cheese for sour cream in dips and salad dressings. Have at least 2 servings of low-fat dairy products, such as 2 glasses of skim (or  1%) milk each day to help get your daily calcium intake.  FATS AND OILS Reduce the total intake of fats, especially saturated fat. Butterfat, lard, and beef fats are high in saturated fat and cholesterol. These should be avoided as much as possible. Vegetable fats do not contain cholesterol, but certain vegetable fats, such as coconut oil, palm oil, and palm kernel oil are very high in saturated fats. These should be limited. These fats are often used in bakery goods, processed foods, popcorn, oils, and nondairy creamers. Vegetable shortenings and some peanut butters contain hydrogenated oils, which are also saturated fats. Read the labels on these foods and check for saturated vegetable oils.  Unsaturated vegetable oils and fats do not raise blood cholesterol. However, they should be limited because they are fats and are high in calories. Total fat should still be limited to 30% of your daily caloric intake. Desirable liquid vegetable oils are corn oil, cottonseed oil, olive oil, canola oil, safflower oil, soybean oil, and sunflower oil. Peanut oil is not as good,  but small amounts are acceptable. Buy a heart-healthy tub margarine that has no partially hydrogenated oils in the ingredients. Mayonnaise and salad dressings often are made from unsaturated fats, but they should also be limited because of their high calorie and fat content. Seeds, nuts, peanut butter, olives, and avocados are high in fat, but the fat is mainly the unsaturated type. These foods should be limited mainly to avoid excess calories and fat.  OTHER EATING TIPS Snacks  Most sweets should be limited as snacks. They tend to be rich in calories and fats, and their caloric content outweighs their nutritional value. Some good choices in snacks are graham crackers, melba toast, soda crackers, bagels (no egg), English muffins, fruits, and vegetables. These snacks are preferable to snack crackers, [place] fries, and chips. Popcorn should be  air-popped or cooked in small amounts of liquid vegetable oil.  Desserts Eat fruit, low-fat yogurt, and fruit ices instead of pastries, cake, and cookies. Sherbet, angel food cake, gelatin dessert, frozen low-fat yogurt, or other frozen products that do not contain saturated fat (pure fruit juice bars, frozen ice pops) are also acceptable.   COOKING METHODS Choose those methods that use little or no fat. They include: Poaching.  Braising.  Steaming.  Grilling.  Baking.  Stir-frying.  Broiling.  Microwaving.  Foods can be cooked in a nonstick pan without added fat, or use a nonfat cooking spray in regular cookware. Limit fried foods and avoid frying in saturated fat. Add moisture to lean meats by using water, broth, cooking wines, and other nonfat or low-fat sauces along with the cooking methods mentioned above. Soups and stews should be chilled after cooking. The fat that forms on top after a few hours in the refrigerator should be skimmed off. When preparing meals, avoid using excess salt. Salt can contribute to raising blood pressure in some people.  EATING AWAY FROM HOME Order entres, potatoes, and vegetables without sauces or butter. When meat exceeds the size of a deck of cards (3 to 4 ounces), the rest can be taken home for another meal. Choose vegetable or fruit salads and ask for low-calorie salad dressings to be served on the side. Use dressings sparingly. Limit high-fat toppings, such as bacon, crumbled eggs, cheese, sunflower seeds, and olives. Ask for heart-healthy tub margarine instead of butter.

## 2016-06-22 ENCOUNTER — Encounter (HOSPITAL_COMMUNITY): Payer: Self-pay | Admitting: Gastroenterology

## 2016-06-28 ENCOUNTER — Other Ambulatory Visit: Payer: Self-pay | Admitting: Cardiology

## 2016-06-29 ENCOUNTER — Telehealth: Payer: Self-pay | Admitting: Gastroenterology

## 2016-06-29 NOTE — Telephone Encounter (Signed)
PATIENT SCHEDULED FOR APPOINTMENT  °

## 2016-06-29 NOTE — Telephone Encounter (Signed)
Please call pt. HIS stomach Bx shows gastritis.  FOLLOW A LOW FAT DIET. MEATS SHOULD BE BAKED, BROILED, OR BOILED. Avoid fried foods.   CONTINUE PROTONIX. Reduce dose. TAKE 30 MINUTES PRIOR TO BREAKFAST.  HE SHOULD CONSIDER HAVING A SCREENING COLONOSCOPY.  FOLLOW UP IN 4 MOS E30 PUD/GASTRITIS.

## 2016-06-30 NOTE — Telephone Encounter (Signed)
Obtain TCS REPORT FROM [facility].

## 2016-06-30 NOTE — Telephone Encounter (Signed)
Pt is aware. He said he had a colonoscopy in [facility] by Dr. Ewing Schlein a short time before coming here. York Spaniel he is good for 5 years.

## 2016-06-30 NOTE — Telephone Encounter (Signed)
Records requested

## 2016-09-15 ENCOUNTER — Ambulatory Visit: Payer: BLUE CROSS/BLUE SHIELD | Admitting: Cardiology

## 2016-09-19 ENCOUNTER — Ambulatory Visit: Payer: BLUE CROSS/BLUE SHIELD | Admitting: Nurse Practitioner

## 2016-09-20 ENCOUNTER — Encounter: Payer: Self-pay | Admitting: Nurse Practitioner

## 2016-09-20 ENCOUNTER — Telehealth: Payer: Self-pay | Admitting: Nurse Practitioner

## 2016-09-20 ENCOUNTER — Ambulatory Visit: Payer: BLUE CROSS/BLUE SHIELD | Admitting: Nurse Practitioner

## 2016-09-20 ENCOUNTER — Ambulatory Visit: Payer: BLUE CROSS/BLUE SHIELD | Admitting: Cardiology

## 2016-09-20 NOTE — Telephone Encounter (Signed)
PATIENT WAS A NO SHOW AND LETTER SENT  °

## 2016-09-20 NOTE — Telephone Encounter (Signed)
Noted  

## 2016-09-23 ENCOUNTER — Ambulatory Visit: Payer: BLUE CROSS/BLUE SHIELD | Admitting: Cardiology

## 2016-10-07 ENCOUNTER — Telehealth: Payer: Self-pay | Admitting: Cardiology

## 2016-10-07 NOTE — Telephone Encounter (Signed)
Walk In pt form-[facility] paper dropped off placed in [employer].

## 2016-10-12 ENCOUNTER — Other Ambulatory Visit (HOSPITAL_COMMUNITY): Payer: Self-pay | Admitting: Internal Medicine

## 2016-10-12 DIAGNOSIS — R0989 Other specified symptoms and signs involving the circulatory and respiratory systems: Secondary | ICD-10-CM

## 2016-10-25 ENCOUNTER — Encounter: Payer: Self-pay | Admitting: Nurse Practitioner

## 2016-10-25 ENCOUNTER — Ambulatory Visit (INDEPENDENT_AMBULATORY_CARE_PROVIDER_SITE_OTHER): Payer: BLUE CROSS/BLUE SHIELD | Admitting: Nurse Practitioner

## 2016-10-25 VITALS — BP 103/71 | HR 69 | Temp 98.2°F | Ht 66.0 in | Wt 152.0 lb

## 2016-10-25 DIAGNOSIS — K253 Acute gastric ulcer without hemorrhage or perforation: Secondary | ICD-10-CM | POA: Diagnosis not present

## 2016-10-25 DIAGNOSIS — K922 Gastrointestinal hemorrhage, unspecified: Secondary | ICD-10-CM | POA: Diagnosis not present

## 2016-10-25 DIAGNOSIS — K5900 Constipation, unspecified: Secondary | ICD-10-CM | POA: Insufficient documentation

## 2016-10-25 DIAGNOSIS — K219 Gastro-esophageal reflux disease without esophagitis: Secondary | ICD-10-CM | POA: Diagnosis not present

## 2016-10-25 NOTE — Assessment & Plan Note (Signed)
Ulcer resolved on last endoscopy. No symptoms sig to suggest recurrence. He avoids all NSAIDs and recommend he continues to do so. Avoid aspirin powders as well.

## 2016-10-25 NOTE — Assessment & Plan Note (Signed)
GERD symptoms relatively well managed on Protonix. Occasionally/rarely takes Tums for any breakthrough. I discussed trigger avoidance with him. Return for follow-up in 6 months.

## 2016-10-25 NOTE — Assessment & Plan Note (Signed)
The patient describes constipation and he is on chronic pain medications. He has not tried anything over-the-counter. At this point I'll have him try MiraLAX one to 2 times a day. Return for follow-up in 6 months, call us if any worsening symptoms before then. If this does not improve his constipation we may need to try a medication for opioid-induced constipation such as Amitiza, Movantik, or Symproic.

## 2016-10-25 NOTE — Patient Instructions (Addendum)
1. We will request your previous colonoscopy report from Dr. Ewing Schlein in [place]. 2. We will request a copy of your labs from Dr. Renard Matter 3. Continue taking Protonix. 4. Continue to avoid all "NSAID" medications (ibuprofen, Motrin, Aleve, Advil, naproxen, Naprosyn, and anything with "NSAID" on the bottle). 5. Avoid all aspirin powders such as Goody powders and BC powders. 6. Avoid trigger foods that make her symptoms worse. A list of the likely suspects is included below. 7. Return for follow-up in 6 months. 8. Call us if you have any worsening symptoms before then. 9. You can take Miralax 17 gm (one dose), one to two times a day for constipation. Also take colace stool softener once a day. Call us if this does not help and we can try other options.     Food Choices for Gastroesophageal Reflux Disease, Adult When you have gastroesophageal reflux disease (GERD), the foods you eat and your eating habits are very important. Choosing the right foods can help ease the discomfort of GERD. Consider working with a diet and nutrition specialist (dietitian) to help you make healthy food choices. What general guidelines should I follow? Eating plan   Choose healthy foods low in fat, such as fruits, vegetables, whole grains, low-fat dairy products, and lean meat, fish, and poultry.  Eat frequent, small meals instead of three large meals each day. Eat your meals slowly, in a relaxed setting. Avoid bending over or lying down until 2-3 hours after eating.  Limit high-fat foods such as fatty meats or fried foods.  Limit your intake of oils, butter, and shortening to less than 8 teaspoons each day.  Avoid the following:  Foods that cause symptoms. These may be different for different people. Keep a food diary to keep track of foods that cause symptoms.  Alcohol.  Drinking large amounts of liquid with meals.  Eating meals during the 2-3 hours before bed.  Cook foods using methods other than frying.  This may include baking, grilling, or broiling. Lifestyle    Maintain a healthy weight. Ask your health care provider what weight is healthy for you. If you need to lose weight, work with your health care provider to do so safely.  Exercise for at least 30 minutes on 5 or more days each week, or as told by your health care provider.  Avoid wearing clothes that fit tightly around your waist and chest.  Do not use any products that contain nicotine or tobacco, such as cigarettes and e-cigarettes. If you need help quitting, ask your health care provider.  Sleep with the head of your bed raised. Use a wedge under the mattress or blocks under the bed frame to raise the head of the bed. What foods are not recommended? The items listed may not be a complete list. Talk with your dietitian about what dietary choices are best for you. Grains  Pastries or quick breads with added fat. [place] toast. Vegetables  Deep fried vegetables. [place] fries. Any vegetables prepared with added fat. Any vegetables that cause symptoms. For some people this may include tomatoes and tomato products, chili peppers, onions and garlic, and horseradish. Fruits  Any fruits prepared with added fat. Any fruits that cause symptoms. For some people this may include citrus fruits, such as oranges, grapefruit, pineapple, and lemons. Meats and other protein foods  High-fat meats, such as fatty beef or pork, hot dogs, ribs, ham, sausage, salami and bacon. Fried meat or protein, including fried fish and fried chicken. Nuts and  nut butters. Dairy  Whole milk and chocolate milk. Sour cream. Cream. Ice cream. Cream cheese. Milk shakes. Beverages  Coffee and tea, with or without caffeine. Carbonated beverages. Sodas. Energy drinks. Fruit juice made with acidic fruits (such as orange or grapefruit). Tomato juice. Alcoholic drinks. Fats and oils  Butter. Margarine. Shortening. Ghee. Sweets and desserts  Chocolate and cocoa.  Donuts. Seasoning and other foods  Pepper. Peppermint and spearmint. Any condiments, herbs, or seasonings that cause symptoms. For some people, this may include curry, hot sauce, or vinegar-based salad dressings. Summary  When you have gastroesophageal reflux disease (GERD), food and lifestyle choices are very important to help ease the discomfort of GERD.  Eat frequent, small meals instead of three large meals each day. Eat your meals slowly, in a relaxed setting. Avoid bending over or lying down until 2-3 hours after eating.  Limit high-fat foods such as fatty meat or fried foods. This information is not intended to replace advice given to you by your health care provider. Make sure you discuss any questions you have with your health care provider. Document Released: 07/18/2005 Document Revised: 07/19/2016 Document Reviewed: 07/19/2016 Elsevier Interactive Patient Education  2017 [employer].

## 2016-10-25 NOTE — Progress Notes (Signed)
Referring Provider: Butch Penny, MD Primary Care Physician:  Alice Reichert, MD (Inactive) Primary GI:  Dr. Darrick Penna  Chief Complaint  Patient presents with  . upper gi bleed    f/u, doing ok    HPI:   Cameron Hoover is a 61 y.o. male who presents for follow-up. The patient was last seen in our office 05/25/2016 for upper GI bleed due to acute pylorus ulcer. At that time he was doing okay overall, has been without Protonix for 3 weeks at his last visit because of prior all situation with his insurance. He was put back on [place] to after his procedure due to need to be on it for 1 year status post coronary stenting in October 2016. Recommended surveillance EGD to check status of his ulcer discovered on acute hospital admission for acute upper GI bleed and anemia requiring transfusion. Labs also ordered. Prior office recently had been approved for twice a day.  Surveillance EGD completed 06/20/2016 which found normal esophagus, gastritis status post biopsy, normal duodenum. No further ulcer in the pyloric channel. Surgical pathology found random biopsies of gastritis to be chronic gastritis negative for H. pylori. Recommended reduced Protonix to once a day. Consider screening colonoscopy. Patient indicated recent colonoscopy by Dr. Ewing Schlein a short time prior to seeing [place] and good for "5 years."  Most recent CBC completed 05/25/2016 with a normalized hemoglobin at 13.9.  Today he states he's doing well overall. Denies abdominal pain, N/V, hematochezia, melena, fever, chills, unintentional weight loss since losing weight after his MI last year and associated diet changes. Also has a loose/painful tooth limiting his eating. He has an appointment for dental care in a week. States he's recently had a colonoscpy with Dr. Ewing Schlein in [place] and is ok for 5 years (per the patient). He is having constipation, has a bowel movement every day to every other day; stools are hard and require straining;  consistent with Bristol 2-3. He is on chronic pain medication. Has not tried anything OTC for this as of yet. Denies chest pain, dyspnea, dizziness, lightheadedness, syncope, near syncope. Denies any other upper or lower GI symptoms.  Past Medical History:  Diagnosis Date  . Acute blood loss anemia 03/2016  . Back pain   . Bradycardia    a. not on BB due to this.  . Chronic combined systolic and diastolic CHF (congestive heart failure) (HCC)   . Coronary artery disease    a. previously nonobstructive. b. then severe dyspnea/acute CHF 05/2015 s/p DES to mLAD.  Marland Kitchen Duodenitis 03/2016  . Gastritis 03/2016  . GI bleed    a. 03/2016 with ABL anemia - (gastritis/duodenitis by EGD).  . Hyperlipidemia     Past Surgical History:  Procedure Laterality Date  . CARDIAC CATHETERIZATION  2007  . CARDIAC CATHETERIZATION N/A 05/28/2015   Procedure: Left Heart Cath and Cors/Grafts Angiography;  Surgeon: Lennette Bihari, MD;  Location: [facility] INVASIVE CV LAB;  Service: Cardiovascular;  Laterality: N/A;  . CARDIAC CATHETERIZATION N/A 05/28/2015   Procedure: Coronary Stent Intervention;  Surgeon: Lennette Bihari, MD;  Location: [facility] INVASIVE CV LAB;  Service: Cardiovascular;  Laterality: N/A;  . ESOPHAGOGASTRODUODENOSCOPY N/A 03/21/2016   Procedure: ESOPHAGOGASTRODUODENOSCOPY (EGD);  Surgeon: West Bali, MD;  Location: [facility] ENDO SU[facility];  Service: Endoscopy;  Laterality: N/A;  . ESOPHAGOGASTRODUODENOSCOPY N/A 06/20/2016   Procedure: ESOPHAGOGASTRODUODENOSCOPY (EGD);  Surgeon: West Bali, MD;  Location: [facility] ENDO SUITE;  Service: Endoscopy;  Laterality: N/A;  1:15 pm  Current Outpatient Prescriptions  Medication Sig Dispense Refill  . aspirin EC 81 MG tablet Take 1 tablet (81 mg total) by mouth daily. 90 tablet 3  . atorvastatin (LIPITOR) 80 MG tablet TAKE 1 TABLET BY MOUTH EVERY DAY AT 6PM 90 tablet 3  . furosemide (LASIX) 20 MG tablet TAKE 1 TABLET BY MOUTH EVERY DAY 90 tablet 3  . HYDROcodone-acetaminophen  (NORCO/VICODIN) 5-325 MG tablet Take 1 tablet by mouth every 6 (six) hours as needed (for pain.).   0  . lisinopril (PRINIVIL,ZESTRIL) 5 MG tablet Take 1 tablet (5 mg total) by mouth daily. 30 tablet 11  . LYRICA 100 MG capsule Take 100 mg by mouth 2 (two) times daily.  0  . methocarbamol (ROBAXIN) 500 MG tablet Take 500 mg by mouth 3 (three) times daily.  0  . metoprolol succinate (TOPROL-XL) 25 MG 24 hr tablet TAKE 1 TABLET (25 MG TOTAL) BY MOUTH DAILY. 30 tablet 6  . pantoprazole (PROTONIX) 40 MG tablet Take 1 tablet (40 mg total) by mouth daily before breakfast. 90 tablet 3   No current facility-administered medications for this visit.     Allergies as of 10/25/2016  . (No Known Allergies)    Family History  Problem Relation Age of Onset  . Cancer Father   . Emphysema Father   . Heart attack Brother   . Heart attack Brother   . Cancer Mother   . Diabetes Mother   . Heart attack Cousin 40  . Colon cancer Neg Hx     Social History   Social History  . Marital status: Significant Other    Spouse name: N/A  . Number of children: N/A  . Years of education: N/A   Social History Main Topics  . Smoking status: Current Every Day Smoker    Packs/day: 1.00    Years: 46.00    Types: Cigarettes  . Smokeless tobacco: Never Used  . Alcohol use 8.4 oz/week    14 Cans of beer per week     Comment: drinks 2-3 beers a night (as of 10/25/16)  . Drug use: No  . Sexual activity: Not Asked   Other Topics Concern  . None   Social History Narrative  . None    Review of Systems: Complete ROS negative except as per HPI.   Physical Exam: BP 103/71   Pulse 69   Temp 98.2 F (36.8 C) (Oral)   Ht 5\' 6"  (1.676 m)   Wt 152 lb (68.9 kg)   BMI 24.53 kg/m  General:   Alert and oriented. Pleasant and cooperative. Well-nourished and well-developed.  Head:  Normocephalic and atraumatic. Eyes:  Without icterus, sclera clear and conjunctiva pink.  Ears:  Normal auditory  acuity. Cardiovascular:  S1, S2 present without murmurs appreciated. Extremities without clubbing or edema. Respiratory:  Clear to auscultation bilaterally. No wheezes, rales, or rhonchi. No distress.  Gastrointestinal:  +BS, soft, non-tender and non-distended. No HSM noted. No guarding or rebound. No masses appreciated.  Rectal:  Deferred  Musculoskalatal:  Symmetrical without gross deformities. Neurologic:  Alert and oriented x4;  grossly normal neurologically. Psych:  Alert and cooperative. Normal mood and affect. Heme/Lymph/Immune: No excessive bruising noted.    10/25/2016 11:43 AM   Disclaimer: This note was dictated with voice recognition software. Similar sounding words can inadvertently be transcribed and may not be corrected upon review.

## 2016-10-25 NOTE — Assessment & Plan Note (Signed)
No further bleeding noted. The patient states he just had labs drawn with his primary care. We will request these to check the status of his hemoglobin. Return for follow-up in 6 months. Call us if any more bleeding is noted.

## 2016-10-26 NOTE — Progress Notes (Signed)
cc'ed to pcp °

## 2016-10-26 NOTE — Progress Notes (Addendum)
Received last TCS report from colonoscopy completed 02/22/16 by Dr. Ewing Schlein in [place]. 2 small polyps removed and found to be tubular adenoma on pathology. Recommended 3 year repeat which would be in 2020.  Reports marked for scanning.

## 2016-10-29 ENCOUNTER — Other Ambulatory Visit: Payer: Self-pay | Admitting: Cardiology

## 2016-11-15 NOTE — Progress Notes (Signed)
REVIEWED-NO ADDITIONAL RECOMMENDATIONS. 

## 2016-11-27 ENCOUNTER — Other Ambulatory Visit: Payer: Self-pay | Admitting: Cardiology

## 2016-11-27 DIAGNOSIS — E785 Hyperlipidemia, unspecified: Secondary | ICD-10-CM

## 2016-11-27 DIAGNOSIS — I5021 Acute systolic (congestive) heart failure: Secondary | ICD-10-CM

## 2016-11-27 DIAGNOSIS — I214 Non-ST elevation (NSTEMI) myocardial infarction: Secondary | ICD-10-CM

## 2017-01-16 IMAGING — CT CT ANGIO CHEST
2 of 9 series · 18 of 46 positions shown · IV contrast (OMNI)
Comparison: Chest radiograph 05/28/2015

CLINICAL DATA: Patient with recent cardiac catheterization and
stent placement. Chest tightness. Elevated D-dimer.

EXAM:
CT ANGIOGRAPHY CHEST WITH CONTRAST
TECHNIQUE: Multidetector CT imaging of the chest was performed using the
standard protocol during bolus administration of intravenous
contrast. Multiplanar CT image reconstructions and MIPs were
obtained to evaluate the vascular anatomy.
CONTRAST:  100mL OMNIPAQUE IOHEXOL 350 MG/ML SOLN

[Series 5: thins · axial · 0.67mm/px · z∈[-350,-101]mm · 15 of 281 slices shown]
[im 16/281  lung]
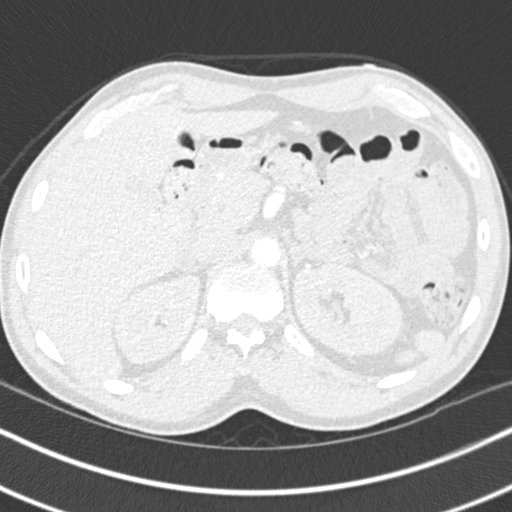
[im 32/281  soft-tissue]
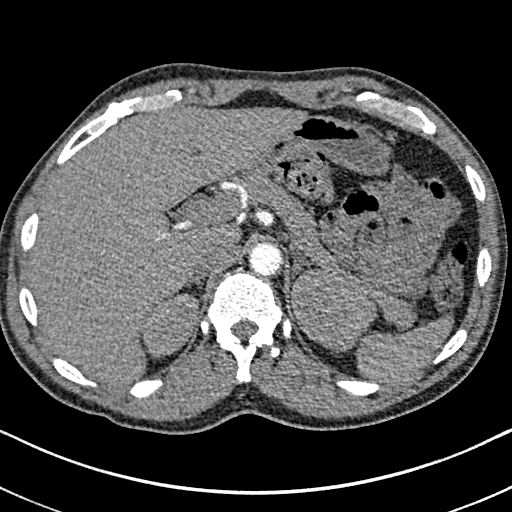
[im 47/281  lung]
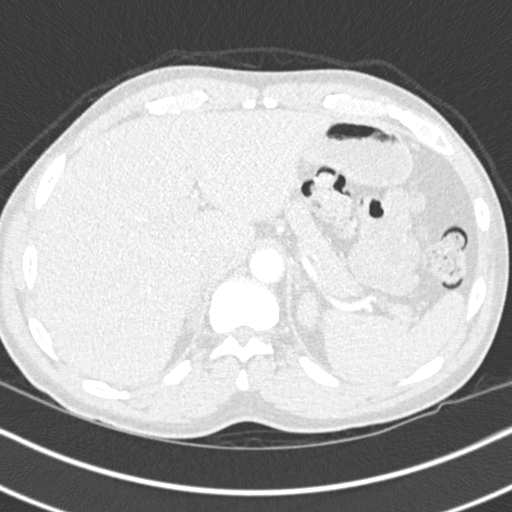
[im 63/281  soft-tissue]
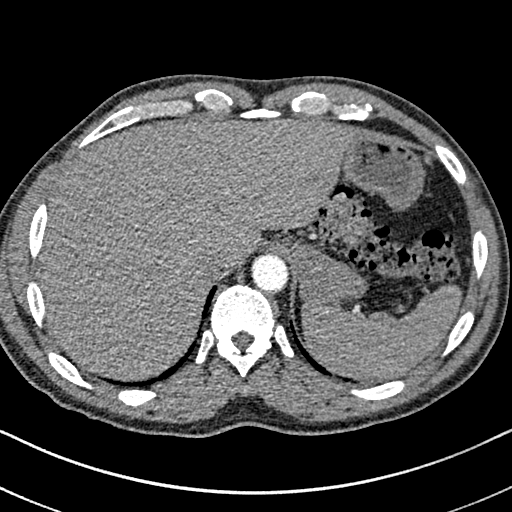
[im 94/281  lung]
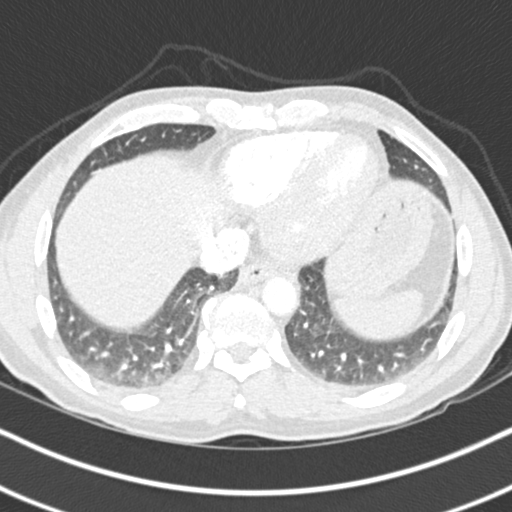
[im 109/281  soft-tissue]
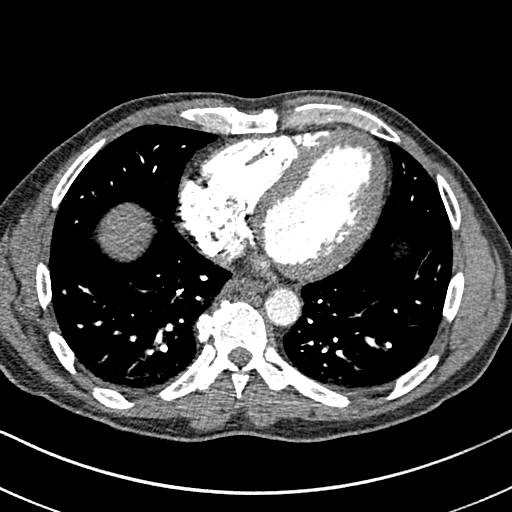
[im 125/281  lung]
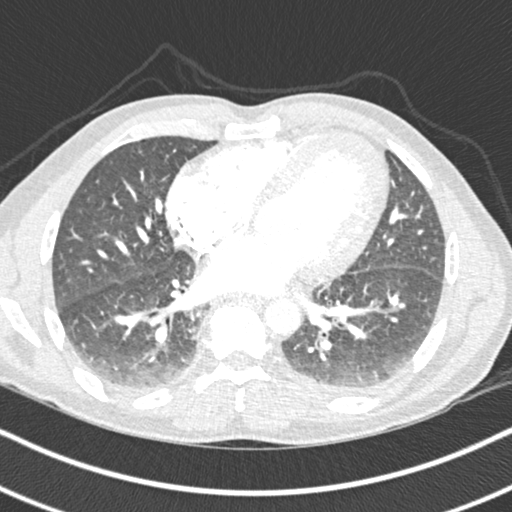
[im 141/281  soft-tissue]
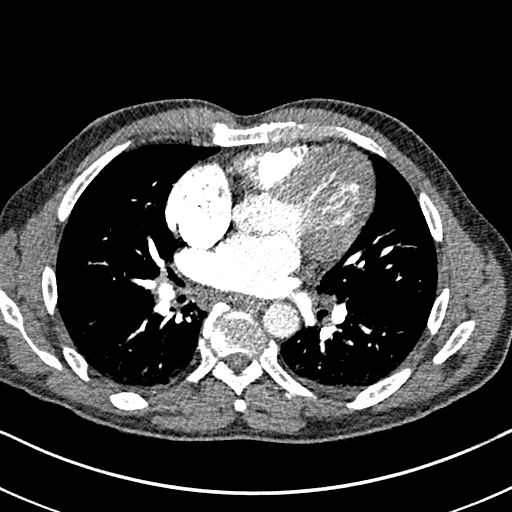
[im 156/281  lung]
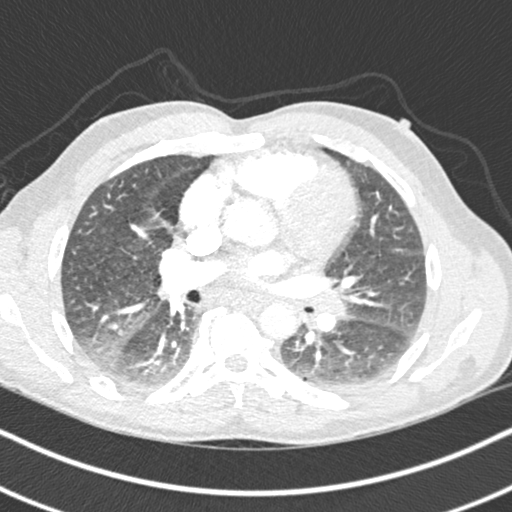
[im 172/281  soft-tissue]
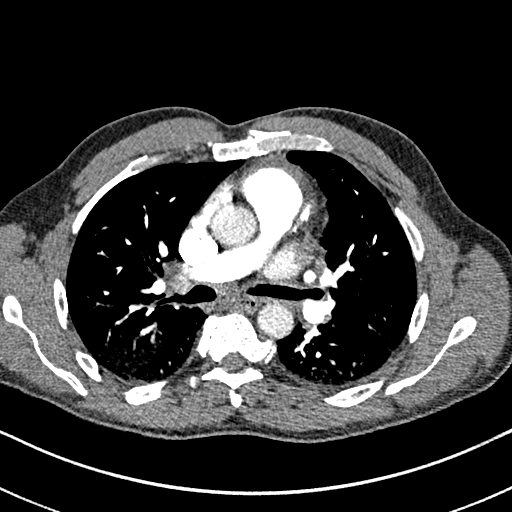
[im 187/281  lung]
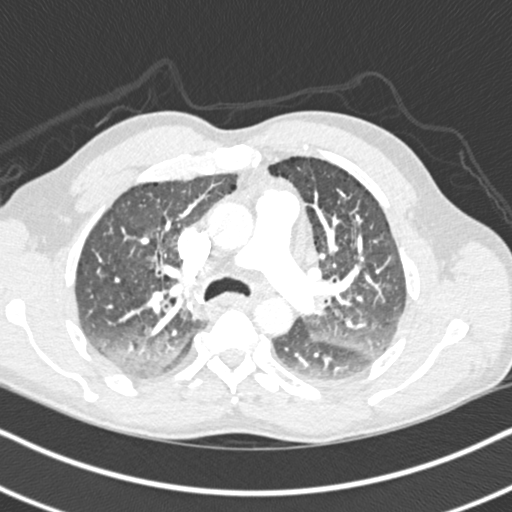
[im 218/281  soft-tissue]
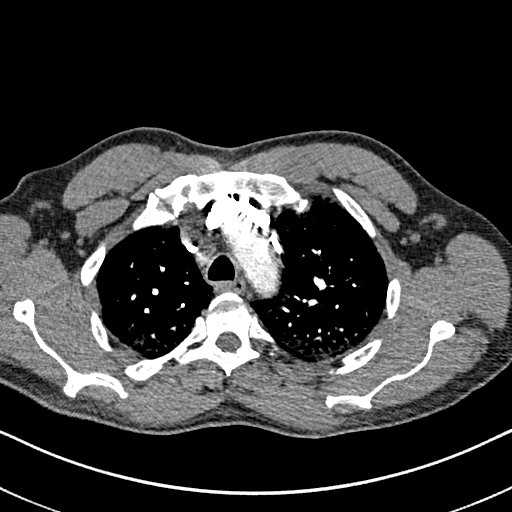
[im 234/281  lung]
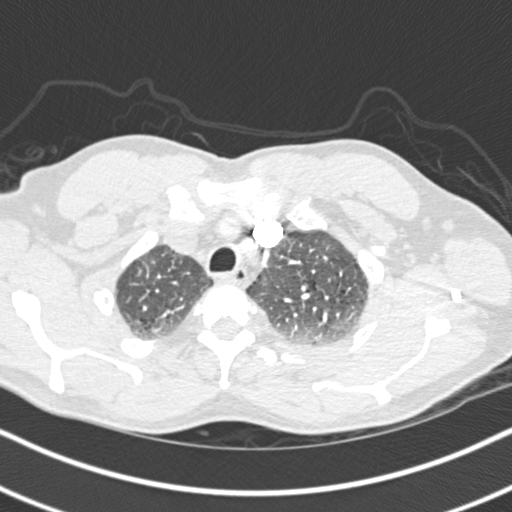
[im 249/281  soft-tissue]
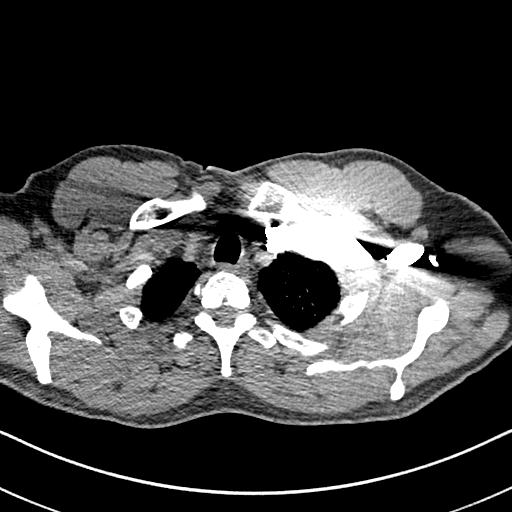
[im 265/281  lung]
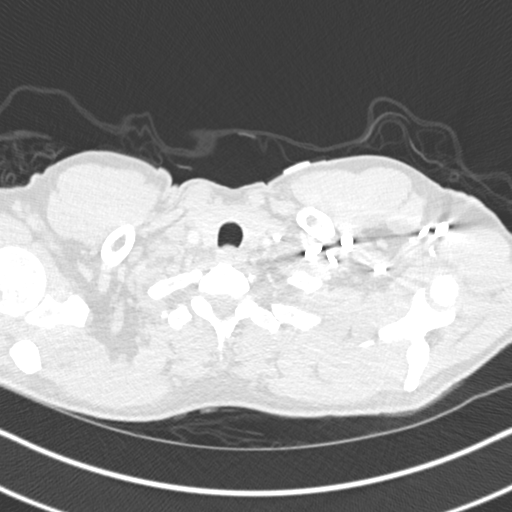

[Series 7: coronal mpr · coronal · 0.57mm/px · 3 of 109 slices shown]
[im 28/109  soft-tissue]
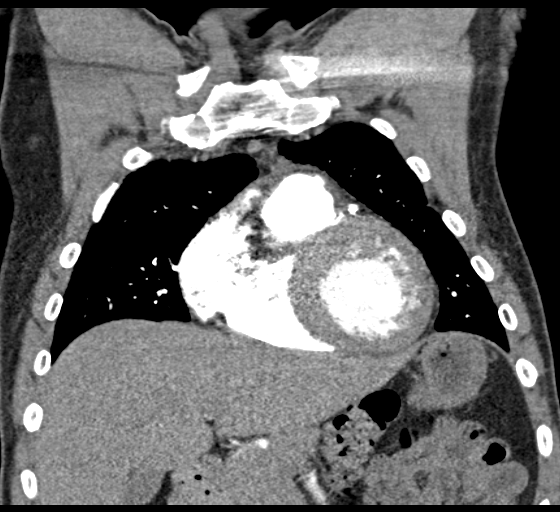
[im 55/109  soft-tissue]
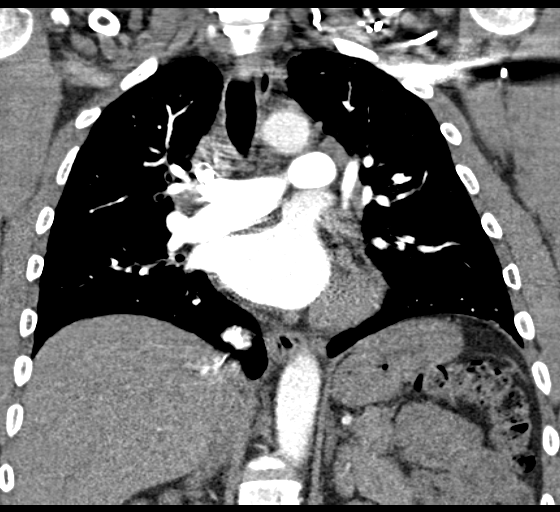
[im 82/109  soft-tissue]
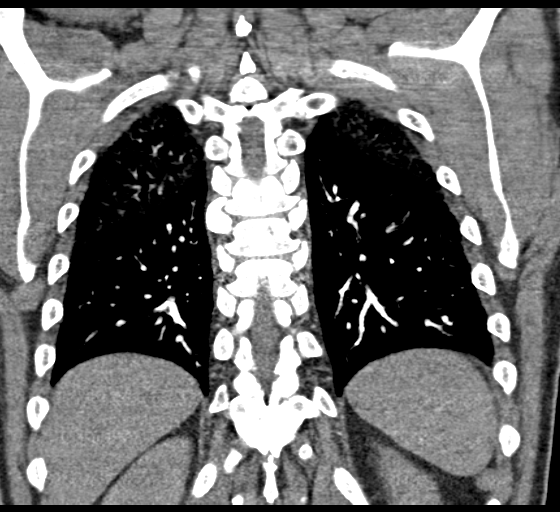

[18 of 46 positions shown; findings below may reference images not displayed]

FINDINGS: Mediastinum/Nodes: Normal heart size. No pericardial effusion. Aorta
and main pulmonary artery are normal in caliber. Visualized thyroid
is unremarkable. Multiple prominent and enlarged mediastinal and
bilateral hilar lymph nodes are demonstrated including a 10 mm
prevascular lymph node (image 29; series 4), a 13 mm left
peritracheal lymph node (image 30; series 2) and a 20 mm right hilar
lymph node (image 38; series 4). Small hiatal hernia.

Adequate opacification of the pulmonary arterial system. No
centralized filling defects are demonstrated to suggest acute
pulmonary embolism.

Lungs/Pleura: Central airways are patent. Smooth interlobular septal
thickening predominantly within the upper lobes. Scattered bilateral
ground-glass pulmonary opacities. Centrilobular and paraseptal
emphysematous change. No large area of pulmonary consolidation.
Trace bilateral pleural effusions. No pneumothorax.

Upper abdomen: Unremarkable.

Musculoskeletal: No evidence for pulmonary embolism.

Review of the MIP images confirms the above findings.
IMPRESSION: No evidence for pulmonary embolism.

Enlarged mediastinal and bilateral hilar lymphadenopathy which is
nonspecific in etiology. This may potentially be infectious or
inflammatory. Sarcoidosis is an additional consideration. Recommend
clinical and laboratory correlation. Additionally followup chest CT
in 3 months is recommended to ensure stability.

Diffuse bilateral ground-glass pulmonary opacities as well as smooth
interlobular septal thickening may be secondary to pulmonary edema.
Atypical infectious process or more chronic pulmonary process such
as interstitial lung disease is not excluded. Recommend attention on
followup.

Centrilobular and paraseptal emphysematous change.

## 2017-04-27 ENCOUNTER — Ambulatory Visit (INDEPENDENT_AMBULATORY_CARE_PROVIDER_SITE_OTHER): Payer: BLUE CROSS/BLUE SHIELD | Admitting: Nurse Practitioner

## 2017-04-27 ENCOUNTER — Encounter: Payer: Self-pay | Admitting: Nurse Practitioner

## 2017-04-27 VITALS — BP 118/73 | HR 67 | Temp 97.9°F | Ht 66.0 in | Wt 147.2 lb

## 2017-04-27 DIAGNOSIS — R109 Unspecified abdominal pain: Secondary | ICD-10-CM

## 2017-04-27 DIAGNOSIS — K219 Gastro-esophageal reflux disease without esophagitis: Secondary | ICD-10-CM

## 2017-04-27 DIAGNOSIS — K5900 Constipation, unspecified: Secondary | ICD-10-CM

## 2017-04-27 MED ORDER — DICYCLOMINE HCL 10 MG PO CAPS: 10.0000 mg | ORAL_CAPSULE | Freq: Three times a day (TID) | ORAL | 1 refills | Status: DC

## 2017-04-27 NOTE — Assessment & Plan Note (Signed)
Constipation generally well-controlled, more often than not he has soft stools every day to every other day. Continue to monitor, return for follow-up in 6 months.

## 2017-04-27 NOTE — Progress Notes (Signed)
Referring Provider: No ref. provider found Primary Care Physician:  Pearson Grippe, MD Primary GI:  Dr. Darrick Penna  Chief Complaint  Patient presents with  . Abdominal Pain    HPI:   Cameron Hoover is a 61 y.o. male who presents For follow-up on GERD and constipation. He presented describing abdominal pain. He was last seen in our office 10/25/2016 for upper GI bleed, acute pyloric ulcer, GERD, constipation. Patient had a history of pyloric ulcer and underwent a surveillance EGD 06/20/2016 which found normal esophagus, gastritis status post biopsy, normal duodenum. No further ulcer in the pyloric channel. Surgical pathology found random biopsies of gastritis to be chronic gastritis negative for H. pylori. Recommended reduced Protonix to once a day. Consider screening colonoscopy. Patient indicated recent colonoscopy by Dr. Ewing Schlein a short time prior to seeing [place] and good for "5 years."  Previous colonoscopy report was her she can Dr. Ewing Schlein and was completed 02/22/16. Findings include 2 small polyps removed and found to be tubular adenoma on pathology. Recommended 3 year repeat which would be in 2020.  His last visit he was doing well overall with no abdominal pain or further upper GI bleed. Loose/painful tooth, planned dental care and a week. Having constipation with a bowel movement every day to every other day but with hard stools that require straining, on chronic pain medication. No other GI symptoms. Recommended continue Protonix, avoid all NSAIDs and aspirin powders, avoid trigger foods, follow-up in 6 months, use MiraLAX one to 2 times a day as needed for constipation as well as a stool softener once daily.  Today he states he's trying to make it. Constipation better, has a bowel movement daily, stools typically consistent with Bristol 4 but occasionally hard and require straining. Has mid abdominal pain from epigastric to umbilical areas. Pain is intermittent, only 2-3 times in the past month.  Thinks it may be due greasy foods. GERD symptoms well controlled. When he has his abdominal pain he will also typically have 2-3 loose bowel movements. Denies hematochezia, melena, fever, chills. Used to weigh 180s about 2 years ago before heart attack and is now in the upper 140s and low 150s. Thinks this may be due to work habits/night shift. Objectively he is within a few pounds of his last visit 6 months ago. Denies chest pain, dyspnea, dizziness, lightheadedness, syncope, near syncope. Denies any other upper or lower GI symptoms.  Past Medical History:  Diagnosis Date  . Acute blood loss anemia 03/2016  . Back pain   . Bradycardia    a. not on BB due to this.  . Chronic combined systolic and diastolic CHF (congestive heart failure) (HCC)   . Coronary artery disease    a. previously nonobstructive. b. then severe dyspnea/acute CHF 05/2015 s/p DES to mLAD.  Marland Kitchen Duodenitis 03/2016  . Gastritis 03/2016  . GI bleed    a. 03/2016 with ABL anemia - (gastritis/duodenitis by EGD).  . Hyperlipidemia     Past Surgical History:  Procedure Laterality Date  . CARDIAC CATHETERIZATION  2007  . CARDIAC CATHETERIZATION N/A 05/28/2015   Procedure: Left Heart Cath and Cors/Grafts Angiography;  Surgeon: Lennette Bihari, MD;  Location: [facility] INVASIVE CV LAB;  Service: Cardiovascular;  Laterality: N/A;  . CARDIAC CATHETERIZATION N/A 05/28/2015   Procedure: Coronary Stent Intervention;  Surgeon: Lennette Bihari, MD;  Location: [facility] INVASIVE CV LAB;  Service: Cardiovascular;  Laterality: N/A;  . ESOPHAGOGASTRODUODENOSCOPY N/A 03/21/2016   Procedure: ESOPHAGOGASTRODUODENOSCOPY (EGD);  Surgeon:  West Bali, MD;  Location: [facility];  Service: Endoscopy;  Laterality: N/A;  . ESOPHAGOGASTRODUODENOSCOPY N/A 06/20/2016   Procedure: ESOPHAGOGASTRODUODENOSCOPY (EGD);  Surgeon: West Bali, MD;  Location: [facility];  Service: Endoscopy;  Laterality: N/A;  1:15 pm    Current Outpatient Prescriptions  Medication  Sig Dispense Refill  . aspirin EC 81 MG tablet Take 1 tablet (81 mg total) by mouth daily. 90 tablet 3  . atorvastatin (LIPITOR) 80 MG tablet TAKE 1 TABLET BY MOUTH EVERY DAY AT 6PM 90 tablet 3  . cholecalciferol (VITAMIN D) 1000 units tablet Take 1,000 Units by mouth daily.    . furosemide (LASIX) 20 MG tablet TAKE 1 TABLET BY MOUTH EVERY DAY 90 tablet 3  . HYDROcodone-acetaminophen (NORCO/VICODIN) 5-325 MG tablet Take 1 tablet by mouth every 6 (six) hours as needed (for pain.).   0  . lisinopril (PRINIVIL,ZESTRIL) 5 MG tablet TAKE 1 TABLET (5 MG TOTAL) BY MOUTH DAILY. 30 tablet 6  . metoprolol succinate (TOPROL-XL) 25 MG 24 hr tablet TAKE 1 TABLET (25 MG TOTAL) BY MOUTH DAILY. 30 tablet 6  . pantoprazole (PROTONIX) 40 MG tablet Take 1 tablet (40 mg total) by mouth daily before breakfast. 90 tablet 3   No current facility-administered medications for this visit.     Allergies as of 04/27/2017  . (No Known Allergies)    Family History  Problem Relation Age of Onset  . Cancer Father   . Emphysema Father   . Heart attack Brother   . Heart attack Brother   . Cancer Mother   . Diabetes Mother   . Heart attack Cousin 40  . Colon cancer Neg Hx     Social History   Social History  . Marital status: Significant Other    Spouse name: N/A  . Number of children: N/A  . Years of education: N/A   Social History Main Topics  . Smoking status: Current Every Day Smoker    Packs/day: 1.00    Years: 46.00    Types: Cigarettes  . Smokeless tobacco: Never Used  . Alcohol use 8.4 oz/week    14 Cans of beer per week     Comment: drinks 2-3 beers a night  . Drug use: No  . Sexual activity: Not Asked   Other Topics Concern  . None   Social History Narrative  . None    Review of Systems: Complete ROS negative except as per HPI.   Physical Exam: BP 118/73   Pulse 67   Temp 97.9 F (36.6 C) (Oral)   Ht 5\' 6"  (1.676 m)   Wt 147 lb 3.2 oz (66.8 kg)   BMI 23.76 kg/m  General:    Alert and oriented. Pleasant and cooperative. Well-nourished and well-developed.  Eyes:  Without icterus, sclera clear and conjunctiva pink.  Ears:  Normal auditory acuity. Cardiovascular:  S1, S2 present without murmurs appreciated. Extremities without clubbing or edema. Respiratory:  Clear to auscultation bilaterally. No wheezes, rales, or rhonchi. No distress.  Gastrointestinal:  +BS, soft, non-tender and non-distended. No HSM noted. No guarding or rebound. No masses appreciated.  Rectal:  Deferred  Musculoskalatal:  Symmetrical without gross deformities. Neurologic:  Alert and oriented x4;  grossly normal neurologically. Psych:  Alert and cooperative. Normal mood and affect. Heme/Lymph/Immune: No excessive bruising noted.    04/27/2017 11:48 AM   Disclaimer: This note was dictated with voice recognition software. Similar sounding words can inadvertently be transcribed and may not be  corrected upon review.

## 2017-04-27 NOTE — Assessment & Plan Note (Signed)
GERD symptoms generally well controlled. Continue taking Protonix once daily. Return for follow-up in 6 months.

## 2017-04-27 NOTE — Assessment & Plan Note (Signed)
Patient describes mid abdominal to epigastric pain, no other GERD symptoms. He does have change in bowel habits to loose stools 2-3 a day when he has his abdominal pain. At this point I will send him Bentyl 10 mg up to 4 times a day as needed for abdominal pain or diarrhea. I have counseled him to stop taking this when his diarrhea and abdominal pain resolved in order to avoid constipation. Continue to monitor symptoms, return for follow-up in 6 months, follow up sooner if needed.

## 2017-04-27 NOTE — Patient Instructions (Signed)
1. Continue taking your current medications. 2. I senT a prescription to your pharmacy for Bentyl 10 mg. yOU can take this up to 4 times a day, if needed, for abdominal pain and/or diarrhea. 3. Return for follow-up in 6 months. 4. Call us if you have any questions or concerns.

## 2017-04-28 NOTE — Progress Notes (Signed)
cc'ed to pcp °

## 2017-05-29 ENCOUNTER — Other Ambulatory Visit: Payer: Self-pay | Admitting: Nurse Practitioner

## 2017-05-29 DIAGNOSIS — R109 Unspecified abdominal pain: Secondary | ICD-10-CM

## 2017-05-29 DIAGNOSIS — K5900 Constipation, unspecified: Secondary | ICD-10-CM

## 2017-05-29 DIAGNOSIS — K219 Gastro-esophageal reflux disease without esophagitis: Secondary | ICD-10-CM

## 2017-05-30 ENCOUNTER — Telehealth: Payer: Self-pay | Admitting: Orthopaedic Surgery

## 2017-05-30 NOTE — Telephone Encounter (Signed)
We received a referral on this patient for chronic back pain.  Dr. Hilda Lias reviewed the notes and stated he would see this patient but that we were to make sure to let the patient know that Dr. Hilda Lias would not be prescribing him any pain medications.  We tried to call Mr. Cameron Hoover on 05/04/17, 05/09/17 and 05/23/17.  I called again today, 05/30/17 and spoke to Mr. Cameron Hoover.  He was asking about whether or not our doctors did back surgery and I told him that they did not.  I also relayed to him what Dr. Hilda Lias said in regards to giving no pain meds.  Mr. Cameron Hoover said he didn't feel that he needed to come here.

## 2017-07-07 ENCOUNTER — Other Ambulatory Visit: Payer: Self-pay | Admitting: Cardiology

## 2017-07-17 ENCOUNTER — Telehealth: Payer: Self-pay | Admitting: Gastroenterology

## 2017-07-17 NOTE — Telephone Encounter (Signed)
[phone] patient called and stated that the pharmacy will not fill his prescription, something about his insurance and he is out of them,  Please call him

## 2017-07-20 NOTE — Telephone Encounter (Signed)
This is not really a refill question. Would request assistance from the last provider who saw patient.

## 2017-07-20 NOTE — Telephone Encounter (Signed)
Pt was at work and pt's girlfriend, Cameron Hoover, said he is supposed to take Protonix bid. She said Dr. Renard Matter' office put him on bid dosing after his heart attack about a year and a half ago.  I am forwarding to refill box to advise.

## 2017-07-24 NOTE — Telephone Encounter (Signed)
Left Vm to contact PCP. Call if questions.

## 2017-08-07 ENCOUNTER — Other Ambulatory Visit: Payer: Self-pay | Admitting: Cardiology

## 2017-08-12 ENCOUNTER — Other Ambulatory Visit: Payer: Self-pay | Admitting: Cardiology

## 2017-08-12 DIAGNOSIS — I214 Non-ST elevation (NSTEMI) myocardial infarction: Secondary | ICD-10-CM

## 2017-08-12 DIAGNOSIS — E785 Hyperlipidemia, unspecified: Secondary | ICD-10-CM

## 2017-08-12 DIAGNOSIS — I5021 Acute systolic (congestive) heart failure: Secondary | ICD-10-CM

## 2017-09-18 ENCOUNTER — Other Ambulatory Visit: Payer: Self-pay | Admitting: Cardiology

## 2017-09-18 DIAGNOSIS — I214 Non-ST elevation (NSTEMI) myocardial infarction: Secondary | ICD-10-CM

## 2017-09-18 DIAGNOSIS — I5021 Acute systolic (congestive) heart failure: Secondary | ICD-10-CM

## 2017-09-18 DIAGNOSIS — E785 Hyperlipidemia, unspecified: Secondary | ICD-10-CM

## 2017-09-21 ENCOUNTER — Encounter: Payer: Self-pay | Admitting: Cardiology

## 2017-09-28 ENCOUNTER — Other Ambulatory Visit: Payer: Self-pay | Admitting: Cardiology

## 2017-09-29 ENCOUNTER — Ambulatory Visit (INDEPENDENT_AMBULATORY_CARE_PROVIDER_SITE_OTHER): Payer: BLUE CROSS/BLUE SHIELD | Admitting: Cardiology

## 2017-09-29 ENCOUNTER — Encounter: Payer: Self-pay | Admitting: Cardiology

## 2017-09-29 VITALS — BP 128/74 | HR 71 | Ht 66.0 in | Wt 149.0 lb

## 2017-09-29 DIAGNOSIS — I214 Non-ST elevation (NSTEMI) myocardial infarction: Secondary | ICD-10-CM | POA: Diagnosis not present

## 2017-09-29 DIAGNOSIS — I2510 Atherosclerotic heart disease of native coronary artery without angina pectoris: Secondary | ICD-10-CM

## 2017-09-29 DIAGNOSIS — E782 Mixed hyperlipidemia: Secondary | ICD-10-CM

## 2017-09-29 DIAGNOSIS — I10 Essential (primary) hypertension: Secondary | ICD-10-CM | POA: Diagnosis not present

## 2017-09-29 LAB — CBC WITH DIFFERENTIAL/PLATELET
Basophils Absolute: 0 10*3/uL (ref 0.0–0.2)
Basos: 0 %
EOS (ABSOLUTE): 0.2 10*3/uL (ref 0.0–0.4)
Eos: 3 %
Hematocrit: 41.5 % (ref 37.5–51.0)
Hemoglobin: 14.5 g/dL (ref 13.0–17.7)
Immature Grans (Abs): 0 10*3/uL (ref 0.0–0.1)
Immature Granulocytes: 0 %
Lymphocytes Absolute: 1.9 10*3/uL (ref 0.7–3.1)
Lymphs: 30 %
MCH: 31 pg (ref 26.6–33.0)
MCHC: 34.9 g/dL (ref 31.5–35.7)
MCV: 89 fL (ref 79–97)
Monocytes Absolute: 0.5 10*3/uL (ref 0.1–0.9)
Monocytes: 7 %
Neutrophils Absolute: 3.8 10*3/uL (ref 1.4–7.0)
Neutrophils: 60 %
Platelets: 347 10*3/uL (ref 150–379)
RBC: 4.67 x10E6/uL (ref 4.14–5.80)
RDW: 13.4 % (ref 12.3–15.4)
WBC: 6.4 10*3/uL (ref 3.4–10.8)

## 2017-09-29 LAB — COMPREHENSIVE METABOLIC PANEL
ALT: 23 IU/L (ref 0–44)
AST: 26 IU/L (ref 0–40)
Albumin/Globulin Ratio: 1.4 (ref 1.2–2.2)
Albumin: 4.6 g/dL (ref 3.6–4.8)
Alkaline Phosphatase: 142 IU/L — ABNORMAL HIGH (ref 39–117)
BUN/Creatinine Ratio: 20 (ref 10–24)
BUN: 17 mg/dL (ref 8–27)
Bilirubin Total: 0.2 mg/dL (ref 0.0–1.2)
CO2: 22 mmol/L (ref 20–29)
Calcium: 9.6 mg/dL (ref 8.6–10.2)
Chloride: 102 mmol/L (ref 96–106)
Creatinine, Ser: 0.87 mg/dL (ref 0.76–1.27)
GFR calc Af Amer: 108 mL/min/{1.73_m2} (ref >59)
GFR calc non Af Amer: 94 mL/min/{1.73_m2} (ref >59)
Globulin, Total: 3.4 g/dL (ref 1.5–4.5)
Glucose: 96 mg/dL (ref 65–99)
Potassium: 4.8 mmol/L (ref 3.5–5.2)
Sodium: 138 mmol/L (ref 134–144)
Total Protein: 8 g/dL (ref 6.0–8.5)

## 2017-09-29 LAB — LIPID PANEL
Chol/HDL Ratio: 3.1 ratio (ref 0.0–5.0)
Cholesterol, Total: 142 mg/dL (ref 100–199)
HDL: 46 mg/dL (ref >39)
LDL Calculated: 81 mg/dL (ref 0–99)
Triglycerides: 73 mg/dL (ref 0–149)
VLDL Cholesterol Cal: 15 mg/dL (ref 5–40)

## 2017-09-29 LAB — TSH: TSH: 1.39 u[IU]/mL (ref 0.450–4.500)

## 2017-09-29 MED ORDER — ATORVASTATIN CALCIUM 80 MG PO TABS: ORAL_TABLET | ORAL | 2 refills | Status: DC

## 2017-09-29 NOTE — Progress Notes (Signed)
Cardiology Office Note    Date:  09/29/2017  ID:  Cameron Hoover, DOB 07-24-1956, MRN [id number] PCP:  Pearson Grippe, MD  Cardiologist:  Delton See  Chief Complaint: f/u GIB  History of Present Illness:  Cameron Hoover is a 62 y.o. male with history of CAD (previously nonobstructive, then severe dyspnea/acute CHF 05/2015 s/p DES to mLAD), bradycardia (not on BB due to this), chronic combined CHF (EF 35-40% in 08/2015), hyperlipidemia, UGIB 03/2016 (gastritis/duodenitis) who presents for f/u. Last 2D echo 08/2015: EF 35%, grade 1 DD, akinesis of inferolat myocardium.  Admitted 8/19-8/21/17 with epigastric pain, black stools, and ABL anemia (Hgb 10, baseline around 14), +FOBT, txfused 1 U PRBC. Troponins negative. Pt underwent EGD on 8/21 with findings of one nonbleeding pyloric channel ulcer with mild erosive gastritis and duodenitis. GI felt this was due to aspirin use. ASA/Brilinta were initially held - ASA resumed on 03/24/16 with recommendation to f/u here to discuss Brilinta. On PPI per GI as well. Most recent Cr 0.87, albumin 2.8, Hgb 10.1.  06/03/2016 - the patient is coming after, he denies any further bleeding, currently on aspirin and Brilinta, he has no chest pain but occasional shortness of breath that has been stable. He has another upper endoscopy scheduled with Dr. Darrick Penna on 06/20/2016. He continues to experience significant right lower extremity pain and lower back pain. Denies any palpitations or syncope.   09/29/2017 - patient is coming after over a year, he states that he's been feeling well, he denies any chest pain but can have shortness of breath on moderate exertion. He works full shifts where he carries heavy boxes and doesn't have shortness of breath with that. He denies any palpitations dizziness or syncope. He has lost a lot of weight since his myocardial infarction in 2016 and wasn't able to gain it back. His Brilinta. After he had GI bleeding 2017. His tolerating aspirin and  atorvastatin well.   Past Medical History:  Diagnosis Date  . Acute blood loss anemia 03/2016  . Back pain   . Bradycardia    a. not on BB due to this.  . Chronic combined systolic and diastolic CHF (congestive heart failure) (HCC)   . Coronary artery disease    a. previously nonobstructive. b. then severe dyspnea/acute CHF 05/2015 s/p DES to mLAD.  Marland Kitchen Duodenitis 03/2016  . Gastritis 03/2016  . GI bleed    a. 03/2016 with ABL anemia - (gastritis/duodenitis by EGD).  . Hyperlipidemia     Past Surgical History:  Procedure Laterality Date  . CARDIAC CATHETERIZATION  2007  . CARDIAC CATHETERIZATION N/A 05/28/2015   Procedure: Left Heart Cath and Cors/Grafts Angiography;  Surgeon: Lennette Bihari, MD;  Location: [facility] INVASIVE CV LAB;  Service: Cardiovascular;  Laterality: N/A;  . CARDIAC CATHETERIZATION N/A 05/28/2015   Procedure: Coronary Stent Intervention;  Surgeon: Lennette Bihari, MD;  Location: [facility] INVASIVE CV LAB;  Service: Cardiovascular;  Laterality: N/A;  . ESOPHAGOGASTRODUODENOSCOPY N/A 03/21/2016   Procedure: ESOPHAGOGASTRODUODENOSCOPY (EGD);  Surgeon: West Bali, MD;  Location: [facility] ENDO SUITE;  Service: Endoscopy;  Laterality: N/A;  . ESOPHAGOGASTRODUODENOSCOPY N/A 06/20/2016   Procedure: ESOPHAGOGASTRODUODENOSCOPY (EGD);  Surgeon: West Bali, MD;  Location: [facility] ENDO SUITE;  Service: Endoscopy;  Laterality: N/A;  1:15 pm    Current Medications: Current Outpatient Medications  Medication Sig Dispense Refill  . aspirin EC 81 MG tablet Take 1 tablet (81 mg total) by mouth daily. 90 tablet 3  . atorvastatin (LIPITOR) 80 MG  tablet TAKE 1 TABLET BY MOUTH EVERY DAY AT 6PM 30 tablet 0  . cholecalciferol (VITAMIN D) 1000 units tablet Take 1,000 Units by mouth daily.    . fluticasone (FLONASE) 50 MCG/ACT nasal spray Place 1 spray into both nostrils daily.    . furosemide (LASIX) 20 MG tablet Take 1 tablet (20 mg total) by mouth daily. Please call [phone] to schedule  appointment for more refills 1st attempt thanks. 90 tablet 0  . HYDROcodone-acetaminophen (NORCO/VICODIN) 5-325 MG tablet Take 1 tablet by mouth every 6 (six) hours as needed (for pain.).   0  . lisinopril (PRINIVIL,ZESTRIL) 5 MG tablet PLEASE SEE ATTACHED FOR DETAILED DIRECTIONS 15 tablet 0  . metoprolol succinate (TOPROL-XL) 25 MG 24 hr tablet TAKE 1 TABLET (25 MG TOTAL) BY MOUTH DAILY. 30 tablet 6  . pantoprazole (PROTONIX) 40 MG tablet Take 1 tablet (40 mg total) by mouth daily before breakfast. 90 tablet 3   No current facility-administered medications for this visit.      Allergies:   Patient has no known allergies.   Social History   Socioeconomic History  . Marital status: Significant Other    Spouse name: None  . Number of children: None  . Years of education: None  . Highest education level: None  Social Needs  . Financial resource strain: None  . Food insecurity - worry: None  . Food insecurity - inability: None  . Transportation needs - medical: None  . Transportation needs - non-medical: None  Occupational History  . None  Tobacco Use  . Smoking status: Current Every Day Smoker    Packs/day: 1.00    Years: 46.00    Pack years: 46.00    Types: Cigarettes  . Smokeless tobacco: Never Used  Substance and Sexual Activity  . Alcohol use: Yes    Alcohol/week: 8.4 oz    Types: 14 Cans of beer per week    Comment: drinks 2-3 beers a night  . Drug use: No  . Sexual activity: None  Other Topics Concern  . None  Social History Narrative  . None     Family History:  The patient's family history includes Cancer in his father and mother; Diabetes in his mother; Emphysema in his father; Heart attack in his brother and brother; Heart attack (age of onset: 48) in his cousin.   ROS:   Please see the history of present illness.   All other systems are reviewed and otherwise negative.    PHYSICAL EXAM:   VS:  BP 128/74   Pulse 71   Ht 5\' 6"  (1.676 m)   Wt 149 lb  (67.6 kg)   BMI 24.05 kg/m   BMI: Body mass index is 24.05 kg/m. GEN: Well nourished, well developed AAM, in no acute distress  HEENT: normocephalic, atraumatic Neck: no JVD, carotid bruits, or masses Cardiac: RRR; no murmurs, rubs, or gallops, no edema, 1+ pedal pulses bilaterally Respiratory:  clear to auscultation bilaterally, normal work of breathing GI: soft, nontender, nondistended, + BS MS: no deformity or atrophy  Skin: warm and dry, no rash Neuro:  Alert and Oriented x 3, Strength and sensation are intact, follows commands Psych: euthymic mood, full affect  Wt Readings from Last 3 Encounters:  09/29/17 149 lb (67.6 kg)  04/27/17 147 lb 3.2 oz (66.8 kg)  10/25/16 152 lb (68.9 kg)    Studies/Labs Reviewed:   EKG:  EKG was not ordered today.  Recent Labs: No results found for requested labs within  last 8760 hours.   Lipid Panel No results found for: CHOL, TRIG, HDL, CHOLHDL, VLDL, LDLCALC, LDLDIRECT  Additional studies/ records that were reviewed today include: Summarized above.  EKG performed today 09/29/2017 shows normal sinus rhythm with negative T waves in leads V5 and V6 that are unchanged from prior, this was personally reviewed.   ASSESSMENT & PLAN:   1. CAD - stable from cardiac standpoint, now back on aspirin, Brilinta discontinued because of GI bleed (gastritis/duodenitis), we'll check CBC today.  2. ABL anemia/UGI bleed - see above.   3. Chronic combined CHF - most recent LVEF 35-40%, appears euvolemic. Continue BB/ACEI.  4. Leg pain - his pulses are really strong, based on his description appears more like radiculopathy.  5. Hyperlipidemia - he ran out of atorvastatin, we'll refill today.  6. Hypertension - well controlled.  At this point if he needed to undergo spinal surgery he would be cleared from cardiac standpoint.  Follow-up in 6 months. Check lipids, CMP, TSH, CBC today.  Medication Adjustments/Labs and Tests Ordered: Current medicines  are reviewed at length with the patient today.  Concerns regarding medicines are outlined above. Medication changes, Labs and Tests ordered today are summarized above and listed in the Patient Instructions accessible in Encounters.   Signed, Tobias Alexander, MD 09/29/2017 10:22 AM    [facility] [address] [place], [place], [place]  [address] Phone: [phone]; Fax: [phone]

## 2017-09-29 NOTE — Patient Instructions (Signed)
Medication Instructions:   Your physician recommends that you continue on your current medications as directed. Please refer to the Current Medication list given to you today.   Labwork:  TODAY-CMET, TSH, CBC W DIFF, AND LIPIDS    Follow-Up:  Your physician wants you to follow-up in: 6 MONTHS WITH DR NELSON You will receive a reminder letter in the mail two months in advance. If you don't receive a letter, please call our office to schedule the follow-up appointment.       If you need a refill on your cardiac medications before your next appointment, please call your pharmacy.   

## 2017-10-25 ENCOUNTER — Encounter: Payer: Self-pay | Admitting: Nurse Practitioner

## 2017-10-25 ENCOUNTER — Ambulatory Visit: Payer: BLUE CROSS/BLUE SHIELD | Admitting: Nurse Practitioner

## 2017-10-25 VITALS — BP 127/75 | HR 65 | Temp 97.9°F | Ht 66.0 in | Wt 153.0 lb

## 2017-10-25 DIAGNOSIS — K5900 Constipation, unspecified: Secondary | ICD-10-CM

## 2017-10-25 DIAGNOSIS — K219 Gastro-esophageal reflux disease without esophagitis: Secondary | ICD-10-CM | POA: Diagnosis not present

## 2017-10-25 NOTE — Progress Notes (Signed)
CC'ED TO PCP 

## 2017-10-25 NOTE — Assessment & Plan Note (Signed)
Gums are not optimally controlled today.  I will increase Protonix to twice a day.  I will also send a message to cardiology because it seems his esophageal burning symptoms occur typically when he is exerting himself and improve when he rests.  He is just seen cardiology.  However, I will make them aware so they can arrange any follow-up they feel is necessary.  Follow-up in 2 months here.

## 2017-10-25 NOTE — Patient Instructions (Signed)
1. I have refilled your Protonix (pantoprazole) for you to take twice a day. 2. Take it 30 minutes before meal. 3. I have sent a message to your cardiology doctor to make them aware of your symptoms.  They may reach out to you to discuss further. 4. Return for follow-up in 2 months. 5. Call us if you have any worsening symptoms before then. 6. Call us if you have any questions or concerns.   It was good to see you today  Enjoy the [place]!!    At [facility] we value your feedback. You may receive a survey about your visit today. Please share your experience as we strive to create trusing relationships with our patients to provide genuine, compassionate, quality care.

## 2017-10-25 NOTE — Assessment & Plan Note (Signed)
Currently well managed.  Continue current medications.  Follow-up in 2 months.

## 2017-10-25 NOTE — Progress Notes (Signed)
Referring Provider: Pearson Grippe, MD Primary Care Physician:  Pearson Grippe, MD Primary GI:  Dr. Darrick Penna  Chief Complaint  Patient presents with  . Gastroesophageal Reflux    HPI:   Cameron Hoover is a 62 y.o. male who presents to follow-up on constipation.  The patient was last seen in our office 04/27/2017 for GERD, abdominal pain, constipation.  History of pyloric ulcer and surveillance EGD 06/20/2016 which found normal exam.  Random biopsies with chronic gastritis negative for H. pylori.  The patient indicated a recent colonoscopy performed in [place] and "no repeat for 5 years."  In actuality, ffindings on that colonoscopy included 2 small polyps found to be tubular adenoma which recommended 3-year repeat which would be 2020.  At his last visit he was doing okay overall.  Constipation better with daily bowel movement and typically [place] 4 stools with occasional hard stools/straining.  Epigastric to umbilical pain only 2-3 times in the previous month and thinks it was due to eating greasy foods.  GERD well controlled.  He had lost approximately 30 pounds in the previous 2 years since having a heart attack and thought it was due to work habits, working night shift, change in diet.  Objectively within the past 6 months he is within a few pounds.  No other GI complaints.  Recommended continue medications, start Bentyl 10 mg up to 4 times a day if needed, follow-up in 6 months.  Today he states he's doing well overall. GERD is doing worse. Has GERD symptoms including epigastric and esophageal burning 2-3 times a week. Walking a long distance triggers his symptoms, symptoms resolve when he sits down and rests. Denies other abdominal pain, N/V, hematochezia, melena. Constipation improved. Denies chest pain, dyspnea, dizziness, lightheadedness, syncope, near syncope. Denies any other upper or lower GI symptoms.  Past Medical History:  Diagnosis Date  . Acute blood loss anemia 03/2016  . Back pain    . Bradycardia    a. not on BB due to this.  . Chronic combined systolic and diastolic CHF (congestive heart failure) (HCC)   . Coronary artery disease    a. previously nonobstructive. b. then severe dyspnea/acute CHF 05/2015 s/p DES to mLAD.  Marland Kitchen Duodenitis 03/2016  . Gastritis 03/2016  . GI bleed    a. 03/2016 with ABL anemia - (gastritis/duodenitis by EGD).  . Hyperlipidemia     Past Surgical History:  Procedure Laterality Date  . CARDIAC CATHETERIZATION  2007  . CARDIAC CATHETERIZATION N/A 05/28/2015   Procedure: Left Heart Cath and Cors/Grafts Angiography;  Surgeon: Lennette Bihari, MD;  Location: [facility] INVASIVE CV LAB;  Service: Cardiovascular;  Laterality: N/A;  . CARDIAC CATHETERIZATION N/A 05/28/2015   Procedure: Coronary Stent Intervention;  Surgeon: Lennette Bihari, MD;  Location: [facility] INVASIVE CV LAB;  Service: Cardiovascular;  Laterality: N/A;  . ESOPHAGOGASTRODUODENOSCOPY N/A 03/21/2016   Procedure: ESOPHAGOGASTRODUODENOSCOPY (EGD);  Surgeon: West Bali, MD;  Location: [facility];  Service: Endoscopy;  Laterality: N/A;  . ESOPHAGOGASTRODUODENOSCOPY N/A 06/20/2016   Procedure: ESOPHAGOGASTRODUODENOSCOPY (EGD);  Surgeon: West Bali, MD;  Location: [facility];  Service: Endoscopy;  Laterality: N/A;  1:15 pm    Current Outpatient Medications  Medication Sig Dispense Refill  . aspirin EC 81 MG tablet Take 1 tablet (81 mg total) by mouth daily. 90 tablet 3  . atorvastatin (LIPITOR) 80 MG tablet TAKE 1 TABLET BY MOUTH EVERY DAY AT 6PM 90 tablet 2  . cholecalciferol (VITAMIN D) 1000 units  tablet Take 1,000 Units by mouth daily.    . fluticasone (FLONASE) 50 MCG/ACT nasal spray Place 1 spray into both nostrils daily.    . furosemide (LASIX) 20 MG tablet Take 1 tablet (20 mg total) by mouth daily. Please call [phone] to schedule appointment for more refills 1st attempt thanks. 90 tablet 0  . HYDROcodone-acetaminophen (NORCO/VICODIN) 5-325 MG tablet Take 1 tablet by mouth  every 6 (six) hours as needed (for pain.).   0  . lisinopril (PRINIVIL,ZESTRIL) 5 MG tablet PLEASE SEE ATTACHED FOR DETAILED DIRECTIONS 15 tablet 0  . loratadine (CLARITIN) 10 MG tablet Take 10 mg by mouth daily.    . metoprolol succinate (TOPROL-XL) 25 MG 24 hr tablet TAKE 1 TABLET (25 MG TOTAL) BY MOUTH DAILY. 30 tablet 6  . pantoprazole (PROTONIX) 40 MG tablet Take 1 tablet (40 mg total) by mouth daily before breakfast. 90 tablet 3   No current facility-administered medications for this visit.     Allergies as of 10/25/2017  . (No Known Allergies)    Family History  Problem Relation Age of Onset  . Cancer Father   . Emphysema Father   . Heart attack Brother   . Heart attack Brother   . Cancer Mother   . Diabetes Mother   . Heart attack Cousin 40  . Colon cancer Neg Hx     Social History   Socioeconomic History  . Marital status: Significant Other    Spouse name: Not on file  . Number of children: Not on file  . Years of education: Not on file  . Highest education level: Not on file  Occupational History  . Not on file  Social Needs  . Financial resource strain: Not on file  . Food insecurity:    Worry: Not on file    Inability: Not on file  . Transportation needs:    Medical: Not on file    Non-medical: Not on file  Tobacco Use  . Smoking status: Current Every Day Smoker    Packs/day: 1.00    Years: 46.00    Pack years: 46.00    Types: Cigarettes  . Smokeless tobacco: Never Used  Substance and Sexual Activity  . Alcohol use: Yes    Alcohol/week: 8.4 oz    Types: 14 Cans of beer per week    Comment: drinks 2-3 beers a night  . Drug use: No  . Sexual activity: Not on file  Lifestyle  . Physical activity:    Days per week: Not on file    Minutes per session: Not on file  . Stress: Not on file  Relationships  . Social connections:    Talks on phone: Not on file    Gets together: Not on file    Attends religious service: Not on file    Active member of  club or organization: Not on file    Attends meetings of clubs or organizations: Not on file    Relationship status: Not on file  Other Topics Concern  . Not on file  Social History Narrative  . Not on file    Review of Systems: Complete ROS negative except as per HPI.   Physical Exam: BP 127/75   Pulse 65   Temp 97.9 F (36.6 C) (Oral)   Ht 5\' 6"  (1.676 m)   Wt 153 lb (69.4 kg)   BMI 24.69 kg/m  General:   Alert and oriented. Pleasant and cooperative. Well-nourished and well-developed.  Eyes:  Without  icterus, sclera clear and conjunctiva pink.  Ears:  Normal auditory acuity. Cardiovascular:  S1, S2 present without murmurs appreciated. Extremities without clubbing or edema. Respiratory:  Clear to auscultation bilaterally. No wheezes, rales, or rhonchi. No distress.  Gastrointestinal:  +BS, soft, non-tender and non-distended. No HSM noted. No guarding or rebound. No masses appreciated.  Rectal:  Deferred  Musculoskalatal:  Symmetrical without gross deformities. Neurologic:  Alert and oriented x4;  grossly normal neurologically. Psych:  Alert and cooperative. Normal mood and affect. Heme/Lymph/Immune: No excessive bruising noted.    10/25/2017 11:36 AM   Disclaimer: This note was dictated with voice recognition software. Similar sounding words can inadvertently be transcribed and may not be corrected upon review.

## 2017-12-04 ENCOUNTER — Other Ambulatory Visit: Payer: Self-pay | Admitting: Cardiology

## 2017-12-26 ENCOUNTER — Telehealth: Payer: Self-pay | Admitting: Gastroenterology

## 2017-12-26 NOTE — Telephone Encounter (Signed)
[phone] OR [phone] PLEASE CALL PATIENT, TRYING TO GET HIS PROTONIX FILLED AND SAID [facility] WILL NOT FILL IT.  MAY BE AN INSURANCE ISSUE HE SAID.

## 2017-12-27 ENCOUNTER — Telehealth: Payer: Self-pay | Admitting: Gastroenterology

## 2017-12-27 DIAGNOSIS — K253 Acute gastric ulcer without hemorrhage or perforation: Secondary | ICD-10-CM

## 2017-12-27 DIAGNOSIS — K219 Gastro-esophageal reflux disease without esophagitis: Secondary | ICD-10-CM

## 2017-12-27 NOTE — Telephone Encounter (Signed)
Pt needs his pantoprazole prescription changed to once a day instead of twice a day. That way insurance will cover it. [facility] uses [facility] [place]

## 2017-12-27 NOTE — Telephone Encounter (Signed)
Tried to call pt, mail box full and could not leave a message.  

## 2017-12-27 NOTE — Telephone Encounter (Signed)
Tried to call pt and could not reach him.  

## 2017-12-27 NOTE — Telephone Encounter (Signed)
I spoke to [facility]ist at [facility] and he said he sent the request to Dr. Selena Batten. They will let us know if there is a problem.

## 2017-12-28 ENCOUNTER — Telehealth: Payer: Self-pay

## 2017-12-28 NOTE — Telephone Encounter (Signed)
Received the fax request for the refill on pantoprazole 40 mg bid. Forwarding to refill box for refills and I will work on [place].

## 2017-12-28 NOTE — Telephone Encounter (Signed)
Eric, per your last OV note, would you please send in the Rx for the pantoprazole for this pt.

## 2017-12-28 NOTE — Telephone Encounter (Signed)
I spoke to [facility] at [facility] and he said Dr. Elmyra Ricks office has not refilled. Insurance will only dover once a day. I told him that Wynne Dust, NP said in March he needs bid and to send refill request to [place] and PA info and I will work on PA for pt. He will send it over.

## 2017-12-28 NOTE — Telephone Encounter (Signed)
I called pt's insurance [phone] to request PA for Pantoprazole. I spoke to Alexus H who took the information for Pantoprazole 40 mg bid. She said it should be reviewed today and they will notify me via fax when it is complete.

## 2017-12-29 MED ORDER — PANTOPRAZOLE SODIUM 40 MG PO TBEC: 40.0000 mg | DELAYED_RELEASE_TABLET | Freq: Two times a day (BID) | ORAL | 3 refills | Status: DC

## 2017-12-29 NOTE — Addendum Note (Signed)
Addended by: Delane Ginger, Abagail Limb A on: 12/29/2017 03:20 PM   Modules accepted: Orders

## 2017-12-29 NOTE — Telephone Encounter (Signed)
Sent!

## 2018-01-03 NOTE — Telephone Encounter (Signed)
Received approval for the Pantoprazole 40 mg. Coverage good from 12/28/2017-12/29/2018. Contract ID [id number]. HDI Number [id number].  Faxing to pharmacy.

## 2018-01-09 ENCOUNTER — Ambulatory Visit: Payer: BLUE CROSS/BLUE SHIELD | Admitting: Nurse Practitioner

## 2018-01-09 ENCOUNTER — Encounter: Payer: Self-pay | Admitting: Gastroenterology

## 2018-01-09 ENCOUNTER — Telehealth: Payer: Self-pay | Admitting: Nurse Practitioner

## 2018-01-09 NOTE — Telephone Encounter (Signed)
PATIENT WAS A NO SHOW AND LETTER SENT  °

## 2018-01-09 NOTE — Telephone Encounter (Signed)
Noted  

## 2018-01-11 ENCOUNTER — Other Ambulatory Visit: Payer: Self-pay | Admitting: Cardiology

## 2018-01-11 DIAGNOSIS — I5021 Acute systolic (congestive) heart failure: Secondary | ICD-10-CM

## 2018-01-11 DIAGNOSIS — E785 Hyperlipidemia, unspecified: Secondary | ICD-10-CM

## 2018-01-11 DIAGNOSIS — I214 Non-ST elevation (NSTEMI) myocardial infarction: Secondary | ICD-10-CM

## 2018-04-04 ENCOUNTER — Other Ambulatory Visit: Payer: Self-pay | Admitting: Cardiology

## 2018-05-25 ENCOUNTER — Ambulatory Visit (INDEPENDENT_AMBULATORY_CARE_PROVIDER_SITE_OTHER): Payer: BLUE CROSS/BLUE SHIELD | Admitting: Cardiology

## 2018-05-25 ENCOUNTER — Encounter: Payer: Self-pay | Admitting: Cardiology

## 2018-05-25 VITALS — BP 132/78 | HR 81 | Ht 66.0 in | Wt 154.0 lb

## 2018-05-25 DIAGNOSIS — I2510 Atherosclerotic heart disease of native coronary artery without angina pectoris: Secondary | ICD-10-CM | POA: Diagnosis not present

## 2018-05-25 DIAGNOSIS — I5042 Chronic combined systolic (congestive) and diastolic (congestive) heart failure: Secondary | ICD-10-CM

## 2018-05-25 DIAGNOSIS — K219 Gastro-esophageal reflux disease without esophagitis: Secondary | ICD-10-CM

## 2018-05-25 DIAGNOSIS — Z720 Tobacco use: Secondary | ICD-10-CM

## 2018-05-25 DIAGNOSIS — E782 Mixed hyperlipidemia: Secondary | ICD-10-CM | POA: Diagnosis not present

## 2018-05-25 DIAGNOSIS — I10 Essential (primary) hypertension: Secondary | ICD-10-CM

## 2018-05-25 NOTE — Progress Notes (Signed)
Cardiology Office Note:    Date:  05/25/2018   ID:  Cameron Hoover, DOB [birth year], MRN [id number]  PCP:  Pearson Grippe, MD  Cardiologist:  Tobias Alexander, MD  Referring MD: Pearson Grippe, MD   Chief Complaint  Patient presents with  . Follow-up    CAD    History of Present Illness:    Cameron Hoover is a 62 y.o. male with a past medical history significant for CAD (previously nonobstructive, then severe dyspnea/acute CHF 05/2015 s/p DES to mLAD), bradycardia (not on BB due to this), chronic combined CHF (EF 35-40% in 08/2015), hyperlipidemia, UGIB 03/2016 (gastritis/duodenitis) who presents for f/u. Last 2D echo 08/2015: EF 35%, grade 1 DD, akinesis of inferolat myocardium.  He was evaluated in 03/2016 with epigastric pain, black stools and anemia requiring transfusion.  Troponins were negative.  EGD showed 1 nonbleeding pyloric channel ulcer with mild erosive gastritis and duodenitis.  GI felt this was due to aspirin use.  Aspirin and Brilinta were initially held but aspirin was resumed.  In 06/2016 the patient was seen for follow-up with no further bleeding continued on aspirin.   He was last seen in the office in March 2019 by Dr. Delton See at which time it was noted that he was working full-time with no chest pain and only occasional mild shortness of breath with more moderate exertion.  He is here today for follow-up. He has started a new job where he has to walk about 2-3 blocks and he has some DOE with the walking. He also has DOE with walking up 4 flights of stairs. He works 12 hours for 5 days per week and does not have time to exercise. No chest pain/pressure/tightness.    He has radiculopathy related to "pinched nerve" in his back with pain in right leg. He had an MRI in [place], [place] in 2017. He had normal ABI's here in 2017.    Past Medical History:  Diagnosis Date  . Acute blood loss anemia 03/2016  . Back pain   . Bradycardia    a. not on BB due to this.  . Chronic combined  systolic and diastolic CHF (congestive heart failure) (HCC)   . Coronary artery disease    a. previously nonobstructive. b. then severe dyspnea/acute CHF 05/2015 s/p DES to mLAD.  Marland Kitchen Duodenitis 03/2016  . Gastritis 03/2016  . GI bleed    a. 03/2016 with ABL anemia - (gastritis/duodenitis by EGD).  . Hyperlipidemia     Past Surgical History:  Procedure Laterality Date  . CARDIAC CATHETERIZATION  2007  . CARDIAC CATHETERIZATION N/A 05/28/2015   Procedure: Left Heart Cath and Cors/Grafts Angiography;  Surgeon: Lennette Bihari, MD;  Location: [facility] INVASIVE CV LAB;  Service: Cardiovascular;  Laterality: N/A;  . CARDIAC CATHETERIZATION N/A 05/28/2015   Procedure: Coronary Stent Intervention;  Surgeon: Lennette Bihari, MD;  Location: [facility] INVASIVE CV LAB;  Service: Cardiovascular;  Laterality: N/A;  . ESOPHAGOGASTRODUODENOSCOPY N/A 03/21/2016   Procedure: ESOPHAGOGASTRODUODENOSCOPY (EGD);  Surgeon: West Bali, MD;  Location: [facility] ENDO SUITE;  Service: Endoscopy;  Laterality: N/A;  . ESOPHAGOGASTRODUODENOSCOPY N/A 06/20/2016   Procedure: ESOPHAGOGASTRODUODENOSCOPY (EGD);  Surgeon: West Bali, MD;  Location: [facility] ENDO SUITE;  Service: Endoscopy;  Laterality: N/A;  1:15 pm    Current Medications: Current Meds  Medication Sig  . aspirin EC 81 MG tablet Take 1 tablet (81 mg total) by mouth daily.  Marland Kitchen atorvastatin (LIPITOR) 80 MG tablet TAKE 1 TABLET BY MOUTH EVERY  DAY AT 6PM  . cholecalciferol (VITAMIN D) 1000 units tablet Take 1,000 Units by mouth daily.  . fluticasone (FLONASE) 50 MCG/ACT nasal spray Place 1 spray into both nostrils daily.  . furosemide (LASIX) 20 MG tablet TAKE 1 TABLET (20 MG TOTAL) BY MOUTH DAILY.  Marland Kitchen HYDROcodone-acetaminophen (NORCO/VICODIN) 5-325 MG tablet Take 1 tablet by mouth every 6 (six) hours as needed (for pain.).   Marland Kitchen lisinopril (PRINIVIL,ZESTRIL) 5 MG tablet Take 1 tablet (5 mg total) by mouth daily.  Marland Kitchen loratadine (CLARITIN) 10 MG tablet Take 10 mg by mouth daily.  .  metoprolol succinate (TOPROL-XL) 25 MG 24 hr tablet TAKE 1 TABLET (25 MG TOTAL) BY MOUTH DAILY.  . pantoprazole (PROTONIX) 40 MG tablet Take 1 tablet (40 mg total) by mouth 2 (two) times daily before a meal.     Allergies:   Patient has no known allergies.   Social History   Socioeconomic History  . Marital status: Significant Other    Spouse name: Not on file  . Number of children: Not on file  . Years of education: Not on file  . Highest education level: Not on file  Occupational History  . Not on file  Social Needs  . Financial resource strain: Not on file  . Food insecurity:    Worry: Not on file    Inability: Not on file  . Transportation needs:    Medical: Not on file    Non-medical: Not on file  Tobacco Use  . Smoking status: Current Every Day Smoker    Packs/day: 1.00    Years: 46.00    Pack years: 46.00    Types: Cigarettes  . Smokeless tobacco: Never Used  Substance and Sexual Activity  . Alcohol use: Yes    Alcohol/week: 14.0 standard drinks    Types: 14 Cans of beer per week    Comment: drinks 2-3 beers a night  . Drug use: No  . Sexual activity: Not on file  Lifestyle  . Physical activity:    Days per week: Not on file    Minutes per session: Not on file  . Stress: Not on file  Relationships  . Social connections:    Talks on phone: Not on file    Gets together: Not on file    Attends religious service: Not on file    Active member of club or organization: Not on file    Attends meetings of clubs or organizations: Not on file    Relationship status: Not on file  Other Topics Concern  . Not on file  Social History Narrative  . Not on file     Family History: The patient's family history includes Cancer in his father and mother; Diabetes in his mother; Emphysema in his father; Heart attack in his brother and brother; Heart attack (age of onset: 71) in his cousin. There is no history of Colon cancer. ROS:   Please see the history of present  illness.     All other systems reviewed and are negative.  EKGs/Labs/Other Studies Reviewed:    The following studies were reviewed today:  Echocardiogram 08/19/2015 Study Conclusions - Left ventricle: The cavity size was normal. Wall thickness was   normal. Systolic function was moderately reduced. The estimated   ejection fraction was in the range of 35% to 40%. Diffuse   hypokinesis. There is akinesis of the inferolateral myocardium.   Doppler parameters are consistent with abnormal left ventricular   relaxation (grade 1 diastolic dysfunction).  Impressions: - Global hypokinesis with akinesis of the inferolateral wall;   overall moderate LV dysfunction (EF 35); grade 1 diastolic   dysfunction; trace MR.  Echo 05/28/2015: EF 40-45%  Left heart cath 05/28/2015  LM lesion, 25% stenosed.  Prox RCA lesion, 10% stenosed.  Mid LAD lesion, 80% stenosed. Post intervention, there is a 0% residual stenosis.  There is mild left ventricular systolic dysfunction.   Mild LV dysfunction with an ejection fraction of 50% with suggestion of a small region of mid anterolateral and mid inferior hypokinesis.  Coronary obstructive disease with mild 25% stenosis in the distal left main, 80% eccentric stenosis in the proximal to mid LAD, normal ramus intermediate, normal left circumflex, and dominant RCA with smooth 10% proximal narrowing.    Successful PCI to the LAD with ultimate insertion of a 3.026 mm Resolute DES stent postdilated to 3.26 mm with the 80% eccentric stenosis being reduced to 0%.  There was TIMI-3 flow and no evidence for dissection.  RECOMMENDATION: The patient should maintain on dual antiplatelet therapy .  ideally for up to a year.  The patient had a mildly positive d-dimer and if there are still concerns for PE, further evaluation with CT angiogram or a VQ scan should be obtained.   EKG:  EKG is not ordered today.   Recent Labs: 09/29/2017: ALT 23; BUN 17;  Creatinine, Ser 0.87; Hemoglobin 14.5; Platelets 347; Potassium 4.8; Sodium 138; TSH 1.390   Recent Lipid Panel    Component Value Date/Time   CHOL 142 09/29/2017 1042   TRIG 73 09/29/2017 1042   HDL 46 09/29/2017 1042   CHOLHDL 3.1 09/29/2017 1042   LDLCALC 81 09/29/2017 1042    Physical Exam:    VS:  BP 132/78   Pulse 81   Ht 5\' 6"  (1.676 m)   Wt 154 lb (69.9 kg)   SpO2 98%   BMI 24.86 kg/m     Wt Readings from Last 3 Encounters:  05/25/18 154 lb (69.9 kg)  10/25/17 153 lb (69.4 kg)  09/29/17 149 lb (67.6 kg)     Physical Exam  Constitutional: He is oriented to person, place, and time. He appears well-developed and well-nourished. No distress.  HENT:  Head: Normocephalic and atraumatic.  edentulous  Neck: Normal range of motion. Neck supple. No JVD present.  Cardiovascular: Normal rate and regular rhythm.  Murmur heard.  Harsh midsystolic murmur of grade 2/6 is also present at the upper left sternal border. Pulmonary/Chest: Effort normal and breath sounds normal. No respiratory distress. He has no wheezes. He has no rales.  Abdominal: Soft. Bowel sounds are normal.  Musculoskeletal: Normal range of motion. He exhibits no edema or deformity.  Neurological: He is alert and oriented to person, place, and time.  Skin: Skin is warm and dry.  Psychiatric: He has a normal mood and affect. His behavior is normal. Judgment and thought content normal.  Vitals reviewed.   ASSESSMENT:    1. CAD in native artery   2. Chronic combined systolic and diastolic CHF (congestive heart failure) (HCC)   3. Mixed hyperlipidemia   4. Essential hypertension   5. Tobacco abuse   6. Gastroesophageal reflux disease, esophagitis presence not specified    PLAN:    In order of problems listed above:  1.  CAD -He continues on aspirin and statin -He works about 60 hours per week in a very active job and has had no chest discomfort.  -We discussed DASH diet. He has no  teeth and tends to  eat a lot of pasta and soft foods. Advised to get some frozen vegetables that he can steam in the bag and would be easy to eat.   2.  Chronic combined CHF -Most recent EF was stable at 35-40% in 08/2015 -Medical management with Toprol-XL 25 mg daily, lisinopril 5 mg daily, Lasix 20 mg daily -He is having some DOE with walking 2-3 blocks and climbing 4 flights of stairs. He appears euvolemic and wt is stable. This is not new and may be more related to smoking.  -He has a systolic murmur that seems to be more prominent than previously documented. Will check echo for LV function and valve status.   3.  Hyperlipidemia -On atorvastatin 80 mg daily.  LDL in 10/18/2017 was 81.  Continue current therapy.  4.  Hypertension -BP well controlled.   5. Tobacco use -Continues to smoke ~ 1 PPD. He has been through a program and has nicotine replacement and fake cigarette, but has not been motivated to quit.  -We discussed the increased risk of another heart attack and heart failure if he continues to smoke.   6. Alcohol use -He drinks at least 2-3 beers per night. Advised to decrease his intake.   7. Genella Rife -He is having more GERD symptoms after eating pasta with tomato sauce. Advised to avoid offending foods and decrease alcohol intake. Smoking cessation would also likely help.  -He continues on pantoprazole.    Medication Adjustments/Labs and Tests Ordered: Current medicines are reviewed at length with the patient today.  Concerns regarding medicines are outlined above. Labs and tests ordered and medication changes are outlined in the patient instructions below:  Patient Instructions  Medication Instructions:  Your physician recommends that you continue on your current medications as directed. Please refer to the Current Medication list given to you today.  If you need a refill on your cardiac medications before your next appointment, please call your pharmacy.   Lab work: None  If you have labs  (blood work) drawn today and your tests are completely normal, you will receive your results only by: Marland Kitchen MyChart Message (if you have MyChart) OR . A paper copy in the mail If you have any lab test that is abnormal or we need to change your treatment, we will call you to review the results.  Testing/Procedures: Your physician has requested that you have an echocardiogram. Echocardiography is a painless test that uses sound waves to create images of your heart. It provides your doctor with information about the size and shape of your heart and how well your heart's chambers and valves are working. This procedure takes approximately one hour. There are no restrictions for this procedure.   Follow-Up: At [facility], you and your health needs are our priority.  As part of our continuing mission to provide you with exceptional heart care, we have created designated Provider Care Teams.  These Care Teams include your primary Cardiologist (physician) and Advanced Practice Providers (APPs -  Physician Assistants and Nurse Practitioners) who all work together to provide you with the care you need, when you need it. You will need a follow up appointment in 6 months.  Please call our office 2 months in advance to schedule this appointment.  You may see Tobias Alexander, MD or one of the following Advanced Practice Providers on your designated Care Team:   [employer], PA-C Ronie Spies, PA-C . Jacolyn Reedy, PA-C  Any Other Special Instructions Will  Be Listed Below (If Applicable).  Steps to Quit Smoking Smoking tobacco can be bad for your health. It can also affect almost every organ in your body. Smoking puts you and people around you at risk for many serious long-lasting (chronic) diseases. Quitting smoking is hard, but it is one of the best things that you can do for your health. It is never too late to quit. What are the benefits of quitting smoking? When you quit smoking, you lower your risk  for getting serious diseases and conditions. They can include: Lung cancer or lung disease. Heart disease. Stroke. Heart attack. Not being able to have children (infertility). Weak bones (osteoporosis) and broken bones (fractures).  If you have coughing, wheezing, and shortness of breath, those symptoms may get better when you quit. You may also get sick less often. If you are pregnant, quitting smoking can help to lower your chances of having a baby of low birth weight. What can I do to help me quit smoking? Talk with your doctor about what can help you quit smoking. Some things you can do (strategies) include: Quitting smoking totally, instead of slowly cutting back how much you smoke over a period of time. Going to in-person counseling. You are more likely to quit if you go to many counseling sessions. Using resources and support systems, such as: Online chats with a Veterinary surgeon. Phone quitlines. Printed Materials engineer. Support groups or group counseling. Text messaging programs. Mobile phone apps or applications. Taking medicines. Some of these medicines may have nicotine in them. If you are pregnant or breastfeeding, do not take any medicines to quit smoking unless your doctor says it is okay. Talk with your doctor about counseling or other things that can help you.  Talk with your doctor about using more than one strategy at the same time, such as taking medicines while you are also going to in-person counseling. This can help make quitting easier. What things can I do to make it easier to quit? Quitting smoking might feel very hard at first, but there is a lot that you can do to make it easier. Take these steps: Talk to your family and friends. Ask them to support and encourage you. Call phone quitlines, reach out to support groups, or work with a Veterinary surgeon. Ask people who smoke to not smoke around you. Avoid places that make you want (trigger) to smoke, such  as: Bars. Parties. Smoke-break areas at work. Spend time with people who do not smoke. Lower the stress in your life. Stress can make you want to smoke. Try these things to help your stress: Getting regular exercise. Deep-breathing exercises. Yoga. Meditating. Doing a body scan. To do this, close your eyes, focus on one area of your body at a time from head to toe, and notice which parts of your body are tense. Try to relax the muscles in those areas. Download or buy apps on your mobile phone or tablet that can help you stick to your quit plan. There are many free apps, such as QuitGuide from the [employer] developer for Disease Control and Prevention). You can find more support from smokefree.gov and other websites.  This information is not intended to replace advice given to you by your health care provider. Make sure you discuss any questions you have with your health care provider. Document Released: 05/14/2009 Document Revised: 03/15/2016 Document Reviewed: 12/02/2014 Elsevier Interactive Patient Education  2018 Elsevier Inc.    DASH Eating Plan DASH stands for "Dietary Approaches  to Stop Hypertension." The DASH eating plan is a healthy eating plan that has been shown to reduce high blood pressure (hypertension). It may also reduce your risk for type 2 diabetes, heart disease, and stroke. The DASH eating plan may also help with weight loss. What are tips for following this plan? General guidelines  Avoid eating more than 2,300 mg (milligrams) of salt (sodium) a day. If you have hypertension, you may need to reduce your sodium intake to 1,500 mg a day.  Limit alcohol intake to no more than 1 drink a day for nonpregnant women and 2 drinks a day for men. One drink equals 12 oz of beer, 5 oz of wine, or 1 oz of hard liquor.  Work with your health care provider to maintain a healthy body weight or to lose weight. Ask what an ideal weight is for you.  Get at least 30 minutes of exercise that  causes your heart to beat faster (aerobic exercise) most days of the week. Activities may include walking, swimming, or biking.  Work with your health care provider or diet and nutrition specialist (dietitian) to adjust your eating plan to your individual calorie needs. Reading food labels  Check food labels for the amount of sodium per serving. Choose foods with less than 5 percent of the Daily Value of sodium. Generally, foods with less than 300 mg of sodium per serving fit into this eating plan.  To find whole grains, look for the word "whole" as the first word in the ingredient list. Shopping  Buy products labeled as "low-sodium" or "no salt added."  Buy fresh foods. Avoid canned foods and premade or frozen meals. Cooking  Avoid adding salt when cooking. Use salt-free seasonings or herbs instead of table salt or sea salt. Check with your health care provider or pharmacist before using salt substitutes.  Do not fry foods. Cook foods using healthy methods such as baking, boiling, grilling, and broiling instead.  Cook with heart-healthy oils, such as olive, canola, soybean, or sunflower oil. Meal planning   Eat a balanced diet that includes: ? 5 or more servings of fruits and vegetables each day. At each meal, try to fill half of your plate with fruits and vegetables. ? Up to 6-8 servings of whole grains each day. ? Less than 6 oz of lean meat, poultry, or fish each day. A 3-oz serving of meat is about the same size as a deck of cards. One egg equals 1 oz. ? 2 servings of low-fat dairy each day. ? A serving of nuts, seeds, or beans 5 times each week. ? Heart-healthy fats. Healthy fats called Omega-3 fatty acids are found in foods such as flaxseeds and coldwater fish, like sardines, salmon, and mackerel.  Limit how much you eat of the following: ? Canned or prepackaged foods. ? Food that is high in trans fat, such as fried foods. ? Food that is high in saturated fat, such as fatty  meat. ? Sweets, desserts, sugary drinks, and other foods with added sugar. ? Full-fat dairy products.  Do not salt foods before eating.  Try to eat at least 2 vegetarian meals each week.  Eat more home-cooked food and less restaurant, buffet, and fast food.  When eating at a restaurant, ask that your food be prepared with less salt or no salt, if possible. What foods are recommended? The items listed may not be a complete list. Talk with your dietitian about what dietary choices are best for you. Grains  Whole-grain or whole-wheat bread. Whole-grain or whole-wheat pasta. Brown rice. Orpah Cobb. Bulgur. Whole-grain and low-sodium cereals. Pita bread. Low-fat, low-sodium crackers. Whole-wheat flour tortillas. Vegetables Fresh or frozen vegetables (raw, steamed, roasted, or grilled). Low-sodium or reduced-sodium tomato and vegetable juice. Low-sodium or reduced-sodium tomato sauce and tomato paste. Low-sodium or reduced-sodium canned vegetables. Fruits All fresh, dried, or frozen fruit. Canned fruit in natural juice (without added sugar). Meat and other protein foods Skinless chicken or [place]. Ground chicken or [place]. Pork with fat trimmed off. Fish and seafood. Egg whites. Dried beans, peas, or lentils. Unsalted nuts, nut butters, and seeds. Unsalted canned beans. Lean cuts of beef with fat trimmed off. Low-sodium, lean deli meat. Dairy Low-fat (1%) or fat-free (skim) milk. Fat-free, low-fat, or reduced-fat cheeses. Nonfat, low-sodium ricotta or cottage cheese. Low-fat or nonfat yogurt. Low-fat, low-sodium cheese. Fats and oils Soft margarine without trans fats. Vegetable oil. Low-fat, reduced-fat, or light mayonnaise and salad dressings (reduced-sodium). Canola, safflower, olive, soybean, and sunflower oils. Avocado. Seasoning and other foods Herbs. Spices. Seasoning mixes without salt. Unsalted popcorn and pretzels. Fat-free sweets. What foods are not recommended? The items listed  may not be a complete list. Talk with your dietitian about what dietary choices are best for you. Grains Baked goods made with fat, such as croissants, muffins, or some breads. Dry pasta or rice meal packs. Vegetables Creamed or fried vegetables. Vegetables in a cheese sauce. Regular canned vegetables (not low-sodium or reduced-sodium). Regular canned tomato sauce and paste (not low-sodium or reduced-sodium). Regular tomato and vegetable juice (not low-sodium or reduced-sodium). Rosita Fire. Olives. Fruits Canned fruit in a light or heavy syrup. Fried fruit. Fruit in cream or butter sauce. Meat and other protein foods Fatty cuts of meat. Ribs. Fried meat. Tomasa Blase. Sausage. Bologna and other processed lunch meats. Salami. Fatback. Hotdogs. Bratwurst. Salted nuts and seeds. Canned beans with added salt. Canned or smoked fish. Whole eggs or egg yolks. Chicken or [place] with skin. Dairy Whole or 2% milk, cream, and half-and-half. Whole or full-fat cream cheese. Whole-fat or sweetened yogurt. Full-fat cheese. Nondairy creamers. Whipped toppings. Processed cheese and cheese spreads. Fats and oils Butter. Stick margarine. Lard. Shortening. Ghee. Bacon fat. Tropical oils, such as coconut, palm kernel, or palm oil. Seasoning and other foods Salted popcorn and pretzels. Onion salt, garlic salt, seasoned salt, table salt, and sea salt. Worcestershire sauce. Tartar sauce. Barbecue sauce. Teriyaki sauce. Soy sauce, including reduced-sodium. Steak sauce. Canned and packaged gravies. Fish sauce. Oyster sauce. Cocktail sauce. Horseradish that you find on the shelf. Ketchup. Mustard. Meat flavorings and tenderizers. Bouillon cubes. Hot sauce and Tabasco sauce. Premade or packaged marinades. Premade or packaged taco seasonings. Relishes. Regular salad dressings. Where to find more information:  National Heart, Lung, and Blood Institute: PopSteam.is  American Heart Association: www.heart.org Summary  The DASH  eating plan is a healthy eating plan that has been shown to reduce high blood pressure (hypertension). It may also reduce your risk for type 2 diabetes, heart disease, and stroke.  With the DASH eating plan, you should limit salt (sodium) intake to 2,300 mg a day. If you have hypertension, you may need to reduce your sodium intake to 1,500 mg a day.  When on the DASH eating plan, aim to eat more fresh fruits and vegetables, whole grains, lean proteins, low-fat dairy, and heart-healthy fats.  Work with your health care provider or diet and nutrition specialist (dietitian) to adjust your eating plan to your individual calorie needs. This information is not intended  to replace advice given to you by your health care provider. Make sure you discuss any questions you have with your health care provider. Document Released: 07/07/2011 Document Revised: 07/11/2016 Document Reviewed: 07/11/2016 Elsevier Interactive Patient Education  [employer].     Signed, Berton Bon, NP  05/25/2018 4:44 PM    [facility]

## 2018-05-25 NOTE — Patient Instructions (Addendum)
Medication Instructions:  Your physician recommends that you continue on your current medications as directed. Please refer to the Current Medication list given to you today.  If you need a refill on your cardiac medications before your next appointment, please call your pharmacy.   Lab work: None  If you have labs (blood work) drawn today and your tests are completely normal, you will receive your results only by: Marland Kitchen MyChart Message (if you have MyChart) OR . A paper copy in the mail If you have any lab test that is abnormal or we need to change your treatment, we will call you to review the results.  Testing/Procedures: Your physician has requested that you have an echocardiogram. Echocardiography is a painless test that uses sound waves to create images of your heart. It provides your doctor with information about the size and shape of your heart and how well your heart's chambers and valves are working. This procedure takes approximately one hour. There are no restrictions for this procedure.   Follow-Up: At [facility], you and your health needs are our priority.  As part of our continuing mission to provide you with exceptional heart care, we have created designated Provider Care Teams.  These Care Teams include your primary Cardiologist (physician) and Advanced Practice Providers (APPs -  Physician Assistants and Nurse Practitioners) who all work together to provide you with the care you need, when you need it. You will need a follow up appointment in 6 months.  Please call our office 2 months in advance to schedule this appointment.  You may see Tobias Alexander, MD or one of the following Advanced Practice Providers on your designated Care Team:   [place], PA-C Ronie Spies, PA-C . Jacolyn Reedy, PA-C  Any Other Special Instructions Will Be Listed Below (If Applicable).  Steps to Quit Smoking Smoking tobacco can be bad for your health. It can also affect almost every  organ in your body. Smoking puts you and people around you at risk for many serious long-lasting (chronic) diseases. Quitting smoking is hard, but it is one of the best things that you can do for your health. It is never too late to quit. What are the benefits of quitting smoking? When you quit smoking, you lower your risk for getting serious diseases and conditions. They can include: Lung cancer or lung disease. Heart disease. Stroke. Heart attack. Not being able to have children (infertility). Weak bones (osteoporosis) and broken bones (fractures).  If you have coughing, wheezing, and shortness of breath, those symptoms may get better when you quit. You may also get sick less often. If you are pregnant, quitting smoking can help to lower your chances of having a baby of low birth weight. What can I do to help me quit smoking? Talk with your doctor about what can help you quit smoking. Some things you can do (strategies) include: Quitting smoking totally, instead of slowly cutting back how much you smoke over a period of time. Going to in-person counseling. You are more likely to quit if you go to many counseling sessions. Using resources and support systems, such as: Online chats with a Veterinary surgeon. Phone quitlines. Printed Materials engineer. Support groups or group counseling. Text messaging programs. Mobile phone apps or applications. Taking medicines. Some of these medicines may have nicotine in them. If you are pregnant or breastfeeding, do not take any medicines to quit smoking unless your doctor says it is okay. Talk with your doctor about counseling or other  things that can help you.  Talk with your doctor about using more than one strategy at the same time, such as taking medicines while you are also going to in-person counseling. This can help make quitting easier. What things can I do to make it easier to quit? Quitting smoking might feel very hard at first, but there is a lot  that you can do to make it easier. Take these steps: Talk to your family and friends. Ask them to support and encourage you. Call phone quitlines, reach out to support groups, or work with a Veterinary surgeon. Ask people who smoke to not smoke around you. Avoid places that make you want (trigger) to smoke, such as: Bars. Parties. Smoke-break areas at work. Spend time with people who do not smoke. Lower the stress in your life. Stress can make you want to smoke. Try these things to help your stress: Getting regular exercise. Deep-breathing exercises. Yoga. Meditating. Doing a body scan. To do this, close your eyes, focus on one area of your body at a time from head to toe, and notice which parts of your body are tense. Try to relax the muscles in those areas. Download or buy apps on your mobile phone or tablet that can help you stick to your quit plan. There are many free apps, such as QuitGuide from the [employer] developer for Disease Control and Prevention). You can find more support from smokefree.gov and other websites.  This information is not intended to replace advice given to you by your health care provider. Make sure you discuss any questions you have with your health care provider. Document Released: 05/14/2009 Document Revised: 03/15/2016 Document Reviewed: 12/02/2014 Elsevier Interactive Patient Education  2018 [employer].    DASH Eating Plan DASH stands for "Dietary Approaches to Stop Hypertension." The DASH eating plan is a healthy eating plan that has been shown to reduce high blood pressure (hypertension). It may also reduce your risk for type 2 diabetes, heart disease, and stroke. The DASH eating plan may also help with weight loss. What are tips for following this plan? General guidelines  Avoid eating more than 2,300 mg (milligrams) of salt (sodium) a day. If you have hypertension, you may need to reduce your sodium intake to 1,500 mg a day.  Limit alcohol intake to no more than  1 drink a day for nonpregnant women and 2 drinks a day for men. One drink equals 12 oz of beer, 5 oz of wine, or 1 oz of hard liquor.  Work with your health care provider to maintain a healthy body weight or to lose weight. Ask what an ideal weight is for you.  Get at least 30 minutes of exercise that causes your heart to beat faster (aerobic exercise) most days of the week. Activities may include walking, swimming, or biking.  Work with your health care provider or diet and nutrition specialist (dietitian) to adjust your eating plan to your individual calorie needs. Reading food labels  Check food labels for the amount of sodium per serving. Choose foods with less than 5 percent of the Daily Value of sodium. Generally, foods with less than 300 mg of sodium per serving fit into this eating plan.  To find whole grains, look for the word "whole" as the first word in the ingredient list. Shopping  Buy products labeled as "low-sodium" or "no salt added."  Buy fresh foods. Avoid canned foods and premade or frozen meals. Cooking  Avoid adding salt when cooking. Use  salt-free seasonings or herbs instead of table salt or sea salt. Check with your health care provider or pharmacist before using salt substitutes.  Do not fry foods. Cook foods using healthy methods such as baking, boiling, grilling, and broiling instead.  Cook with heart-healthy oils, such as olive, canola, soybean, or sunflower oil. Meal planning   Eat a balanced diet that includes: ? 5 or more servings of fruits and vegetables each day. At each meal, try to fill half of your plate with fruits and vegetables. ? Up to 6-8 servings of whole grains each day. ? Less than 6 oz of lean meat, poultry, or fish each day. A 3-oz serving of meat is about the same size as a deck of cards. One egg equals 1 oz. ? 2 servings of low-fat dairy each day. ? A serving of nuts, seeds, or beans 5 times each week. ? Heart-healthy fats. Healthy fats  called Omega-3 fatty acids are found in foods such as flaxseeds and coldwater fish, like sardines, salmon, and mackerel.  Limit how much you eat of the following: ? Canned or prepackaged foods. ? Food that is high in trans fat, such as fried foods. ? Food that is high in saturated fat, such as fatty meat. ? Sweets, desserts, sugary drinks, and other foods with added sugar. ? Full-fat dairy products.  Do not salt foods before eating.  Try to eat at least 2 vegetarian meals each week.  Eat more home-cooked food and less restaurant, buffet, and fast food.  When eating at a restaurant, ask that your food be prepared with less salt or no salt, if possible. What foods are recommended? The items listed may not be a complete list. Talk with your dietitian about what dietary choices are best for you. Grains Whole-grain or whole-wheat bread. Whole-grain or whole-wheat pasta. Brown rice. Orpah Cobb. Bulgur. Whole-grain and low-sodium cereals. Pita bread. Low-fat, low-sodium crackers. Whole-wheat flour tortillas. Vegetables Fresh or frozen vegetables (raw, steamed, roasted, or grilled). Low-sodium or reduced-sodium tomato and vegetable juice. Low-sodium or reduced-sodium tomato sauce and tomato paste. Low-sodium or reduced-sodium canned vegetables. Fruits All fresh, dried, or frozen fruit. Canned fruit in natural juice (without added sugar). Meat and other protein foods Skinless chicken or [place]. Ground chicken or [place]. Pork with fat trimmed off. Fish and seafood. Egg whites. Dried beans, peas, or lentils. Unsalted nuts, nut butters, and seeds. Unsalted canned beans. Lean cuts of beef with fat trimmed off. Low-sodium, lean deli meat. Dairy Low-fat (1%) or fat-free (skim) milk. Fat-free, low-fat, or reduced-fat cheeses. Nonfat, low-sodium ricotta or cottage cheese. Low-fat or nonfat yogurt. Low-fat, low-sodium cheese. Fats and oils Soft margarine without trans fats. Vegetable oil. Low-fat,  reduced-fat, or light mayonnaise and salad dressings (reduced-sodium). Canola, safflower, olive, soybean, and sunflower oils. Avocado. Seasoning and other foods Herbs. Spices. Seasoning mixes without salt. Unsalted popcorn and pretzels. Fat-free sweets. What foods are not recommended? The items listed may not be a complete list. Talk with your dietitian about what dietary choices are best for you. Grains Baked goods made with fat, such as croissants, muffins, or some breads. Dry pasta or rice meal packs. Vegetables Creamed or fried vegetables. Vegetables in a cheese sauce. Regular canned vegetables (not low-sodium or reduced-sodium). Regular canned tomato sauce and paste (not low-sodium or reduced-sodium). Regular tomato and vegetable juice (not low-sodium or reduced-sodium). Rosita Fire. Olives. Fruits Canned fruit in a light or heavy syrup. Fried fruit. Fruit in cream or butter sauce. Meat and other protein foods Fatty  cuts of meat. Ribs. Fried meat. Tomasa Blase. Sausage. Bologna and other processed lunch meats. Salami. Fatback. Hotdogs. Bratwurst. Salted nuts and seeds. Canned beans with added salt. Canned or smoked fish. Whole eggs or egg yolks. Chicken or [place] with skin. Dairy Whole or 2% milk, cream, and half-and-half. Whole or full-fat cream cheese. Whole-fat or sweetened yogurt. Full-fat cheese. Nondairy creamers. Whipped toppings. Processed cheese and cheese spreads. Fats and oils Butter. Stick margarine. Lard. Shortening. Ghee. Bacon fat. Tropical oils, such as coconut, palm kernel, or palm oil. Seasoning and other foods Salted popcorn and pretzels. Onion salt, garlic salt, seasoned salt, table salt, and sea salt. Worcestershire sauce. Tartar sauce. Barbecue sauce. Teriyaki sauce. Soy sauce, including reduced-sodium. Steak sauce. Canned and packaged gravies. Fish sauce. Oyster sauce. Cocktail sauce. Horseradish that you find on the shelf. Ketchup. Mustard. Meat flavorings and tenderizers.  Bouillon cubes. Hot sauce and Tabasco sauce. Premade or packaged marinades. Premade or packaged taco seasonings. Relishes. Regular salad dressings. Where to find more information:  National Heart, Lung, and Blood Institute: PopSteam.is  American Heart Association: www.heart.org Summary  The DASH eating plan is a healthy eating plan that has been shown to reduce high blood pressure (hypertension). It may also reduce your risk for type 2 diabetes, heart disease, and stroke.  With the DASH eating plan, you should limit salt (sodium) intake to 2,300 mg a day. If you have hypertension, you may need to reduce your sodium intake to 1,500 mg a day.  When on the DASH eating plan, aim to eat more fresh fruits and vegetables, whole grains, lean proteins, low-fat dairy, and heart-healthy fats.  Work with your health care provider or diet and nutrition specialist (dietitian) to adjust your eating plan to your individual calorie needs. This information is not intended to replace advice given to you by your health care provider. Make sure you discuss any questions you have with your health care provider. Document Released: 07/07/2011 Document Revised: 07/11/2016 Document Reviewed: 07/11/2016 Elsevier Interactive Patient Education  [employer].

## 2018-05-31 ENCOUNTER — Other Ambulatory Visit: Payer: Self-pay

## 2018-05-31 ENCOUNTER — Ambulatory Visit (HOSPITAL_COMMUNITY): Payer: BLUE CROSS/BLUE SHIELD | Attending: Cardiology

## 2018-05-31 DIAGNOSIS — I5042 Chronic combined systolic (congestive) and diastolic (congestive) heart failure: Secondary | ICD-10-CM

## 2018-06-02 ENCOUNTER — Other Ambulatory Visit: Payer: Self-pay | Admitting: Nurse Practitioner

## 2018-06-02 DIAGNOSIS — K253 Acute gastric ulcer without hemorrhage or perforation: Secondary | ICD-10-CM

## 2018-06-02 DIAGNOSIS — K219 Gastro-esophageal reflux disease without esophagitis: Secondary | ICD-10-CM

## 2018-06-08 ENCOUNTER — Telehealth: Payer: Self-pay

## 2018-06-08 DIAGNOSIS — I5042 Chronic combined systolic (congestive) and diastolic (congestive) heart failure: Secondary | ICD-10-CM

## 2018-06-08 NOTE — Telephone Encounter (Signed)
Spoke with pt he is aware of results. Order has been put in epic for follow up in 1 year

## 2018-10-05 ENCOUNTER — Other Ambulatory Visit: Payer: Self-pay | Admitting: Nurse Practitioner

## 2018-10-05 DIAGNOSIS — K219 Gastro-esophageal reflux disease without esophagitis: Secondary | ICD-10-CM

## 2018-10-05 DIAGNOSIS — K253 Acute gastric ulcer without hemorrhage or perforation: Secondary | ICD-10-CM

## 2018-11-03 ENCOUNTER — Other Ambulatory Visit: Payer: Self-pay | Admitting: Cardiology

## 2018-11-03 DIAGNOSIS — I214 Non-ST elevation (NSTEMI) myocardial infarction: Secondary | ICD-10-CM

## 2018-11-03 DIAGNOSIS — I5021 Acute systolic (congestive) heart failure: Secondary | ICD-10-CM

## 2018-11-03 DIAGNOSIS — E785 Hyperlipidemia, unspecified: Secondary | ICD-10-CM

## 2018-12-10 ENCOUNTER — Other Ambulatory Visit: Payer: Self-pay | Admitting: Cardiology

## 2019-01-10 ENCOUNTER — Other Ambulatory Visit: Payer: Self-pay | Admitting: Cardiology

## 2019-01-10 DIAGNOSIS — E782 Mixed hyperlipidemia: Secondary | ICD-10-CM

## 2019-01-10 DIAGNOSIS — I10 Essential (primary) hypertension: Secondary | ICD-10-CM

## 2019-01-10 DIAGNOSIS — I2510 Atherosclerotic heart disease of native coronary artery without angina pectoris: Secondary | ICD-10-CM

## 2019-02-05 ENCOUNTER — Other Ambulatory Visit: Payer: Self-pay | Admitting: Cardiology

## 2019-02-05 DIAGNOSIS — E785 Hyperlipidemia, unspecified: Secondary | ICD-10-CM

## 2019-02-05 DIAGNOSIS — I214 Non-ST elevation (NSTEMI) myocardial infarction: Secondary | ICD-10-CM

## 2019-02-05 DIAGNOSIS — I5021 Acute systolic (congestive) heart failure: Secondary | ICD-10-CM

## 2019-02-21 ENCOUNTER — Other Ambulatory Visit: Payer: Self-pay

## 2019-02-21 ENCOUNTER — Encounter: Payer: Self-pay | Admitting: Cardiology

## 2019-02-21 ENCOUNTER — Ambulatory Visit (INDEPENDENT_AMBULATORY_CARE_PROVIDER_SITE_OTHER): Payer: BC Managed Care – PPO | Admitting: Cardiology

## 2019-02-21 ENCOUNTER — Telehealth: Payer: Self-pay | Admitting: Cardiology

## 2019-02-21 VITALS — BP 128/64 | HR 85 | Ht 66.0 in | Wt 164.4 lb

## 2019-02-21 DIAGNOSIS — Z716 Tobacco abuse counseling: Secondary | ICD-10-CM

## 2019-02-21 DIAGNOSIS — I2510 Atherosclerotic heart disease of native coronary artery without angina pectoris: Secondary | ICD-10-CM | POA: Diagnosis not present

## 2019-02-21 DIAGNOSIS — E785 Hyperlipidemia, unspecified: Secondary | ICD-10-CM

## 2019-02-21 DIAGNOSIS — Z79899 Other long term (current) drug therapy: Secondary | ICD-10-CM

## 2019-02-21 DIAGNOSIS — I10 Essential (primary) hypertension: Secondary | ICD-10-CM

## 2019-02-21 DIAGNOSIS — I5043 Acute on chronic combined systolic (congestive) and diastolic (congestive) heart failure: Secondary | ICD-10-CM

## 2019-02-21 MED ORDER — NICOTINE 7 MG/24HR TD PT24: 7.0000 mg | MEDICATED_PATCH | Freq: Every day | TRANSDERMAL | 0 refills | Status: AC

## 2019-02-21 MED ORDER — NICOTINE 21 MG/24HR TD PT24: 21.0000 mg | MEDICATED_PATCH | Freq: Every day | TRANSDERMAL | 0 refills | Status: DC

## 2019-02-21 MED ORDER — NICOTINE 14 MG/24HR TD PT24: 14.0000 mg | MEDICATED_PATCH | Freq: Every day | TRANSDERMAL | 0 refills | Status: AC

## 2019-02-21 NOTE — Progress Notes (Signed)
02/21/2019 Cameron Hoover   05/17/1956  [id number]  Primary Physician Pearson GrippeKim, James, MD Primary Cardiologist: Tobias AlexanderKatarina Nelson, MD  Electrophysiologist: None   Reason for Visit/CC: f/u for CAD and chronic systolic HF  HPI:  Cameron AdaBruce I Hoover is a 63 y.o. male, followed by Dr. Delton SeeNelson, with a past medical history significant for CAD s/p DES to mLAD in 2016, bradycardia (not on BB due to this), chronic combined systolic and diastolic CHF, hyperlipidemia, UGIB 03/2016 (gastritis/duodenitis) who presents for f/u. Last 2D echo 05/2018: EF 35-40%, grade 2 DD, mild AI. His GIB in the past was in the setting of ASA and Brilitna. Brilinta was ultimately discontinued and he has remained on low dose ASA w/o any recurrent bleeding.   Today in f/u, he reports that he has been doing well. Recently retired from his job. No angina. Denies CP and dyspnea. No claudication. Fully compliant w/ meds. BP is well controlled at 128/64. HR 85.  Still smokes 1ppd. No further GIBs.    Cardiac Studies  LHC 2016 Conclusion   LM lesion, 25% stenosed.  Prox RCA lesion, 10% stenosed.  Mid LAD lesion, 80% stenosed. Post intervention, there is a 0% residual stenosis.  There is mild left ventricular systolic dysfunction.   Mild LV dysfunction with an ejection fraction of 50% with suggestion of a small region of mid anterolateral and mid inferior hypokinesis.  Coronary obstructive disease with mild 25% stenosis in the distal left main, 80% eccentric stenosis in the proximal to mid LAD, normal ramus intermediate, normal left circumflex, and dominant RCA with smooth 10% proximal narrowing.    Successful PCI to the LAD with ultimate insertion of a 3.026 mm Resolute DES stent postdilated to 3.26 mm with the 80% eccentric stenosis being reduced to 0%.  There was TIMI-3 flow and no evidence for dissection.    2D Echo 2019 Study Conclusions  - Left ventricle: The cavity size was normal. Systolic function was   moderately  reduced. The estimated ejection fraction was in the   range of 35% to 40%. Moderate diffuse hypokinesis. Features are   consistent with a pseudonormal left ventricular filling pattern,   with concomitant abnormal relaxation and increased filling   pressure (grade 2 diastolic dysfunction). Doppler parameters are   consistent with high ventricular filling pressure. - Aortic valve: No significant gradient across AV but this may be   underestimated in the setting of LV dysfunction. Moderately to   severely calcified annulus. Trileaflet; moderately thickened,   severely calcified leaflets. Valve mobility was restricted. There   was mild regurgitation. Mean gradient (S): 7 mm Hg. Valve area   (VTI): 1.36 cm^2. Valve area (Vmax): 1.47 cm^2. Valve area   (Vmean): 1.56 cm^2. - Mitral valve: There was mild regurgitation. - Left atrium: The atrium was mildly dilated.  Current Meds  Medication Sig  . aspirin EC 81 MG tablet Take 1 tablet (81 mg total) by mouth daily.  Marland Kitchen. atorvastatin (LIPITOR) 80 MG tablet Take 80 mg by mouth daily at 6 PM.  . cholecalciferol (VITAMIN D) 1000 units tablet Take 1,000 Units by mouth daily.  . fluticasone (FLONASE) 50 MCG/ACT nasal spray Place 1 spray into both nostrils daily.  . furosemide (LASIX) 20 MG tablet Take 1 tablet (20 mg total) by mouth daily. Please make annual appt with Dr. Delton SeeNelson for future refills. Thank you  . HYDROcodone-acetaminophen (NORCO/VICODIN) 5-325 MG tablet Take 1 tablet by mouth every 6 (six) hours as needed (for pain.).   .Marland Kitchen  lisinopril (ZESTRIL) 5 MG tablet TAKE 1 TABLET BY MOUTH DAILY. PLEASE MAKE ANNUAL APPT WITH DR. Meda Coffee FOR FUTURE REFILLS. THANK YOU  . loratadine (CLARITIN) 10 MG tablet Take 10 mg by mouth daily.  . metoprolol succinate (TOPROL-XL) 25 MG 24 hr tablet Take 1 tablet (25 mg total) by mouth daily. Please make annual appt with Dr. Meda Coffee for future refills. Thank you  . pantoprazole (PROTONIX) 40 MG tablet TAKE 1 TABLET BY  MOUTH 2 TIMES DAILY BEFORE A MEAL.   No Known Allergies Past Medical History:  Diagnosis Date  . Acute blood loss anemia 03/2016  . Back pain   . Bradycardia    a. not on BB due to this.  . Chronic combined systolic and diastolic CHF (congestive heart failure) ([place])   . Coronary artery disease    a. previously nonobstructive. b. then severe dyspnea/acute CHF 05/2015 s/p DES to mLAD.  Marland Kitchen Duodenitis 03/2016  . Gastritis 03/2016  . GI bleed    a. 03/2016 with ABL anemia - (gastritis/duodenitis by EGD).  . Hyperlipidemia    Family History  Problem Relation Age of Onset  . Cancer Father   . Emphysema Father   . Heart attack Brother   . Heart attack Brother   . Cancer Mother   . Diabetes Mother   . Heart attack Cousin 40  . Colon cancer Neg Hx    Past Surgical History:  Procedure Laterality Date  . CARDIAC CATHETERIZATION  2007  . CARDIAC CATHETERIZATION N/A 05/28/2015   Procedure: Left Heart Cath and Cors/Grafts Angiography;  Surgeon: Troy Sine, MD;  Location: [facility];  Service: Cardiovascular;  Laterality: N/A;  . CARDIAC CATHETERIZATION N/A 05/28/2015   Procedure: Coronary Stent Intervention;  Surgeon: Troy Sine, MD;  Location: [facility];  Service: Cardiovascular;  Laterality: N/A;  . ESOPHAGOGASTRODUODENOSCOPY N/A 03/21/2016   Procedure: ESOPHAGOGASTRODUODENOSCOPY (EGD);  Surgeon: Danie Binder, MD;  [facility];  Service: Endoscopy;  Laterality: N/A;  . ESOPHAGOGASTRODUODENOSCOPY N/A 06/20/2016   Procedure: ESOPHAGOGASTRODUODENOSCOPY (EGD);  Surgeon: Danie Binder, MD;  Location: [facility];  Service: Endoscopy;  Laterality: N/A;  1:15 pm   Social History   Socioeconomic History  . Marital status: Significant Other    Spouse name: Not on file  . Number of children: Not on file  . Years of education: Not on file  . Highest education level: Not on file  Occupational History  . Not on file  Social Needs  . Financial resource  strain: Not on file  . Food insecurity    Worry: Not on file    Inability: Not on file  . Transportation needs    Medical: Not on file    Non-medical: Not on file  Tobacco Use  . Smoking status: Current Every Day Smoker    Packs/day: 1.00    Years: 46.00    Pack years: 46.00    Types: Cigarettes  . Smokeless tobacco: Never Used  Substance and Sexual Activity  . Alcohol use: Yes    Alcohol/week: 14.0 standard drinks    Types: 14 Cans of beer per week    Comment: drinks 2-3 beers a night  . Drug use: No  . Sexual activity: Not on file  Lifestyle  . Physical activity    Days per week: Not on file    Minutes per session: Not on file  . Stress: Not on file  Relationships  . Social connections  Talks on phone: Not on file    Gets together: Not on file    Attends religious service: Not on file    Active member of club or organization: Not on file    Attends meetings of clubs or organizations: Not on file    Relationship status: Not on file  . Intimate partner violence    Fear of current or ex partner: Not on file    Emotionally abused: Not on file    Physically abused: Not on file    Forced sexual activity: Not on file  Other Topics Concern  . Not on file  Social History Narrative  . Not on file     Lipid Panel     Component Value Date/Time   CHOL 142 09/29/2017 1042   TRIG 73 09/29/2017 1042   HDL 46 09/29/2017 1042   CHOLHDL 3.1 09/29/2017 1042   LDLCALC 81 09/29/2017 1042    Review of Systems: General: negative for chills, fever, night sweats or weight changes.  Cardiovascular: negative for chest pain, dyspnea on exertion, edema, orthopnea, palpitations, paroxysmal nocturnal dyspnea or shortness of breath Dermatological: negative for rash Respiratory: negative for cough or wheezing Urologic: negative for hematuria Abdominal: negative for nausea, vomiting, diarrhea, bright red blood per rectum, melena, or hematemesis Neurologic: negative for visual changes,  syncope, or dizziness All other systems reviewed and are otherwise negative except as noted above.   Physical Exam:  Blood pressure 128/64, pulse 85, height 5\' 6"  (1.676 m), weight 164 lb 6.4 oz (74.6 kg), SpO2 98 %.  General appearance: alert, cooperative and no distress Neck: no carotid bruit and no JVD Lungs: clear to auscultation bilaterally Heart: regular rate and rhythm and 2/6 diastolic murmur, loudest at RUSB and LUSB Extremities: extremities normal, atraumatic, no cyanosis or edema Pulses: 2+ and symmetric Skin: Skin color, texture, turgor normal. No rashes or lesions Neurologic: Grossly normal  EKG not performed -- personally reviewed   ASSESSMENT AND PLAN:   1. CAD: s/p LAD stent in 2016. No other significant disease in other vessels. Stable w/o angina. Continue medical therapy.   2. Tobacco Abuse: smokes 1ppd. We discussed smoking cessation and he is interested in quitting and would like to try nicotine patches. We will order.   3. Chronic Combined Systolic and Diastolic HF: EF 81-10% on echo 05/2018. euvolemic on exam. No dyspnea. Continue, Lasix, metoprolol and lisinopril.   4. HTN: controlled on current regimen. On lisinopril and metoprolol. Will check BMP.   5. AI: mild by echo 05/2018. 2/6 murmur noted on exam. Asymptomatic.   6. HLD: LDL goal < 70 mg/dL. He is past due to his annual lipid panel. We will order fasting labs. Continue Lipitor 80 mg for now. Will adjust regimen if not at recommended goal.    Follow-Up w/ Dr. Delton See in 1 yr.  Idania Desouza Delmer Islam, MHS [facility] 02/21/2019 2:35 PM

## 2019-02-21 NOTE — Patient Instructions (Signed)
Medication Instructions:  Nicotine patch (will call with instructions) If you need a refill on your cardiac medications before your next appointment, please call your pharmacy.   Lab work:2 WEEKS IN [place] CBC FLP HFT If you have labs (blood work) drawn today and your tests are completely normal, you will receive your results only by: Marland Kitchen MyChart Message (if you have MyChart) OR . A paper copy in the mail If you have any lab test that is abnormal or we need to change your treatment, we will call you to review the results.  Testing/Procedures: NONE  Follow-Up: At [facility], you and your health needs are our priority.  As part of our continuing mission to provide you with exceptional heart care, we have created designated Provider Care Teams.  These Care Teams include your primary Cardiologist (physician) and Advanced Practice Providers (APPs -  Physician Assistants and Nurse Practitioners) who all work together to provide you with the care you need, when you need it. You will need a follow up appointment in 1 years.  Please call our office 2 months in advance to schedule this appointment.  You may see Ena Dawley, MD or one of the following Advanced Practice Providers on your designated Care Team:   Morrice, PA-C Melina Copa, PA-C . Ermalinda Barrios, PA-C  Any Other Special Instructions Will Be Listed Below (If Applicable).

## 2019-02-21 NOTE — Telephone Encounter (Signed)
New Message ° ° ° °Left message to confirm appt and answer covid questions  °

## 2019-03-05 ENCOUNTER — Other Ambulatory Visit: Payer: Self-pay | Admitting: Cardiology

## 2019-03-07 LAB — CBC
Hematocrit: 45.8 % (ref 37.5–51.0)
Hemoglobin: 15.4 g/dL (ref 13.0–17.7)
MCH: 30.6 pg (ref 26.6–33.0)
MCHC: 33.6 g/dL (ref 31.5–35.7)
MCV: 91 fL (ref 79–97)
Platelets: 312 10*3/uL (ref 150–450)
RBC: 5.03 x10E6/uL (ref 4.14–5.80)
RDW: 12.4 % (ref 11.6–15.4)
WBC: 6.7 10*3/uL (ref 3.4–10.8)

## 2019-03-08 LAB — HEPATIC FUNCTION PANEL
ALT: 44 IU/L (ref 0–44)
AST: 26 IU/L (ref 0–40)
Albumin: 4.2 g/dL (ref 3.8–4.8)
Alkaline Phosphatase: 146 IU/L — ABNORMAL HIGH (ref 39–117)
Bilirubin Total: 0.2 mg/dL (ref 0.0–1.2)
Bilirubin, Direct: 0.08 mg/dL (ref 0.00–0.40)
Total Protein: 7.3 g/dL (ref 6.0–8.5)

## 2019-03-08 LAB — LIPID PANEL
Chol/HDL Ratio: 3.7 ratio (ref 0.0–5.0)
Cholesterol, Total: 154 mg/dL (ref 100–199)
HDL: 42 mg/dL (ref >39)
LDL Calculated: 88 mg/dL (ref 0–99)
Triglycerides: 118 mg/dL (ref 0–149)
VLDL Cholesterol Cal: 24 mg/dL (ref 5–40)

## 2019-03-11 ENCOUNTER — Other Ambulatory Visit: Payer: Self-pay

## 2019-03-11 ENCOUNTER — Telehealth: Payer: Self-pay

## 2019-03-11 DIAGNOSIS — E785 Hyperlipidemia, unspecified: Secondary | ICD-10-CM

## 2019-03-11 NOTE — Telephone Encounter (Signed)
Notes recorded by Frederik Schmidt, RN on 03/11/2019 at 10:25 AM EDT  The patient has been notified of the result and verbalized understanding. All questions (if any) were answered.  Frederik Schmidt, RN 03/11/2019 10:25 AM   The patient has admitted that he is not taking Lipitor 80 mg Daily.  He will be more compliant and return for labs 9/21.

## 2019-04-22 ENCOUNTER — Other Ambulatory Visit: Payer: Self-pay

## 2019-04-22 ENCOUNTER — Other Ambulatory Visit: Payer: Self-pay | Admitting: *Deleted

## 2019-04-22 DIAGNOSIS — E785 Hyperlipidemia, unspecified: Secondary | ICD-10-CM

## 2019-04-22 LAB — HEPATIC FUNCTION PANEL
ALT: 55 IU/L — ABNORMAL HIGH (ref 0–44)
AST: 37 IU/L (ref 0–40)
Albumin: 4.2 g/dL (ref 3.8–4.8)
Alkaline Phosphatase: 167 IU/L — ABNORMAL HIGH (ref 39–117)
Bilirubin Total: 0.6 mg/dL (ref 0.0–1.2)
Bilirubin, Direct: 0.15 mg/dL (ref 0.00–0.40)
Total Protein: 7.4 g/dL (ref 6.0–8.5)

## 2019-04-22 LAB — LIPID PANEL
Chol/HDL Ratio: 3.3 ratio (ref 0.0–5.0)
Cholesterol, Total: 139 mg/dL (ref 100–199)
HDL: 42 mg/dL (ref >39)
LDL Chol Calc (NIH): 75 mg/dL (ref 0–99)
Triglycerides: 124 mg/dL (ref 0–149)
VLDL Cholesterol Cal: 22 mg/dL (ref 5–40)

## 2019-04-30 ENCOUNTER — Other Ambulatory Visit: Payer: Self-pay | Admitting: Cardiology

## 2019-04-30 DIAGNOSIS — I5021 Acute systolic (congestive) heart failure: Secondary | ICD-10-CM

## 2019-04-30 DIAGNOSIS — E785 Hyperlipidemia, unspecified: Secondary | ICD-10-CM

## 2019-04-30 DIAGNOSIS — I214 Non-ST elevation (NSTEMI) myocardial infarction: Secondary | ICD-10-CM

## 2019-05-06 ENCOUNTER — Other Ambulatory Visit: Payer: Self-pay | Admitting: Cardiology

## 2019-05-21 ENCOUNTER — Other Ambulatory Visit: Payer: Self-pay

## 2019-05-21 ENCOUNTER — Encounter (HOSPITAL_COMMUNITY): Payer: Self-pay

## 2019-05-21 ENCOUNTER — Emergency Department (HOSPITAL_COMMUNITY)
Admission: EM | Admit: 2019-05-21 | Discharge: 2019-05-21 | Disposition: A | Discharge status: Home / Self Care | Payer: Self-pay | Attending: Emergency Medicine | Admitting: Emergency Medicine

## 2019-05-21 DIAGNOSIS — I2510 Atherosclerotic heart disease of native coronary artery without angina pectoris: Secondary | ICD-10-CM | POA: Insufficient documentation

## 2019-05-21 DIAGNOSIS — R1310 Dysphagia, unspecified: Secondary | ICD-10-CM | POA: Insufficient documentation

## 2019-05-21 DIAGNOSIS — Z7982 Long term (current) use of aspirin: Secondary | ICD-10-CM | POA: Insufficient documentation

## 2019-05-21 DIAGNOSIS — Z79899 Other long term (current) drug therapy: Secondary | ICD-10-CM | POA: Insufficient documentation

## 2019-05-21 DIAGNOSIS — F17210 Nicotine dependence, cigarettes, uncomplicated: Secondary | ICD-10-CM | POA: Insufficient documentation

## 2019-05-21 DIAGNOSIS — I110 Hypertensive heart disease with heart failure: Secondary | ICD-10-CM | POA: Insufficient documentation

## 2019-05-21 DIAGNOSIS — I5042 Chronic combined systolic (congestive) and diastolic (congestive) heart failure: Secondary | ICD-10-CM | POA: Insufficient documentation

## 2019-05-21 MED ORDER — OMEPRAZOLE 40 MG PO CPDR: 40.0000 mg | DELAYED_RELEASE_CAPSULE | Freq: Every day | ORAL | 0 refills | Status: DC

## 2019-05-21 NOTE — ED Provider Notes (Addendum)
[facility] EMERGENCY DEPARTMENT Provider Note   CSN: [id number] Arrival date & time: 05/21/19  7829     History   Chief Complaint Chief Complaint  Patient presents with  . Dysphagia    HPI Cameron Hoover is a 63 y.o. male with a history of CAD, CHF, gastritis, hyperlipidemia, HTN, h/o pyloric ulcer presenting with increasing episodes of dysphagia for the past several months. He describes being able to eat and drink, but has to eat soft foods. He reports however, having to vomit up a piece of banana 3 days ago which hung up.  He has been crushing his blood pressure and cholesterol medicines but has stopped taking his protonix as it tastes too bad to take this way. He has not had any recent followup with Dr. Oneida Alar for insurance issues.  He denies current symptoms of impaction, also denies abdominal pain.         HPI  Past Medical History:  Diagnosis Date  . Acute blood loss anemia 03/2016  . Back pain   . Bradycardia    a. not on BB due to this.  . Chronic combined systolic and diastolic CHF (congestive heart failure) ([place])   . Coronary artery disease    a. previously nonobstructive. b. then severe dyspnea/acute CHF 05/2015 s/p DES to mLAD.  Marland Kitchen Duodenitis 03/2016  . Gastritis 03/2016  . GI bleed    a. 03/2016 with ABL anemia - (gastritis/duodenitis by EGD).  . Hyperlipidemia     Patient Active Problem List   Diagnosis Date Noted  . Abdominal pain 04/27/2017  . Constipation 10/25/2016  . GERD (gastroesophageal reflux disease) 10/25/2016  . Acute pylorus ulcer 05/25/2016  . CAD in native artery 03/29/2016  . Upper gastrointestinal bleeding 03/19/2016  . Chronic combined systolic and diastolic CHF (congestive heart failure) ([place]) 03/19/2016  . Essential hypertension 11/23/2015  . Cardiomyopathy, ischemic 11/23/2015  . Stented coronary artery   . Hyperlipidemia 05/28/2015  . Tobacco abuse 05/28/2015  . Acute pulmonary edema [facility])     Past Surgical History:   Procedure Laterality Date  . CARDIAC CATHETERIZATION  2007  . CARDIAC CATHETERIZATION N/A 05/28/2015   Procedure: Left Heart Cath and Cors/Grafts Angiography;  Surgeon: Troy Sine, MD;  Location: [place] CV LAB;  Service: Cardiovascular;  Laterality: N/A;  . CARDIAC CATHETERIZATION N/A 05/28/2015   Procedure: Coronary Stent Intervention;  Surgeon: Troy Sine, MD;  Location: [place] CV LAB;  Service: Cardiovascular;  Laterality: N/A;  . ESOPHAGOGASTRODUODENOSCOPY N/A 03/21/2016   Procedure: ESOPHAGOGASTRODUODENOSCOPY (EGD);  Surgeon: Danie Binder, MD;  [facility];  Service: Endoscopy;  Laterality: N/A;  . ESOPHAGOGASTRODUODENOSCOPY N/A 06/20/2016   Procedure: ESOPHAGOGASTRODUODENOSCOPY (EGD);  Surgeon: Danie Binder, MD;  Location: [facility];  Service: Endoscopy;  Laterality: N/A;  1:15 pm        Home Medications    Prior to Admission medications   Medication Sig Start Date End Date Taking? Authorizing Provider  aspirin EC 81 MG tablet Take 1 tablet (81 mg total) by mouth daily. 06/03/16   Dorothy Spark, MD  atorvastatin (LIPITOR) 80 MG tablet Take 80 mg by mouth daily at 6 PM.    [provider]  cholecalciferol (VITAMIN D) 1000 units tablet Take 1,000 Units by mouth daily.    [provider]  fluticasone (FLONASE) 50 MCG/ACT nasal spray Place 1 spray into both nostrils daily.    [provider]  furosemide (LASIX) 20 MG tablet Take 1  tablet (20 mg total) by mouth daily. 03/06/19   Lars Masson, MD  HYDROcodone-acetaminophen (NORCO/VICODIN) 5-325 MG tablet Take 1 tablet by mouth every 6 (six) hours as needed (for pain.).  05/23/16   [provider]  lisinopril (ZESTRIL) 5 MG tablet Take 1 tablet (5 mg total) by mouth daily. 04/30/19   Lars Masson, MD  loratadine (CLARITIN) 10 MG tablet Take 10 mg by mouth daily.    [provider]  metoprolol succinate (TOPROL-XL) 25 MG 24 hr tablet Take 1 tablet  (25 mg total) by mouth daily. 03/06/19   Lars Masson, MD  nicotine (NICODERM CQ - DOSED IN MG/24 HOURS) 21 mg/24hr patch PLACE 1 PATCH (21 MG TOTAL) ONTO THE SKIN DAILY. 05/08/19 06/19/19  Lars Masson, MD  omeprazole (PRILOSEC) 40 MG capsule Take 1 capsule (40 mg total) by mouth daily. 05/21/19   Burgess Amor, PA-C  pantoprazole (PROTONIX) 40 MG tablet TAKE 1 TABLET BY MOUTH 2 TIMES DAILY BEFORE A MEAL. 10/05/18   Tiffany Kocher, PA-C    Family History Family History  Problem Relation Age of Onset  . Cancer Father   . Emphysema Father   . Heart attack Brother   . Heart attack Brother   . Cancer Mother   . Diabetes Mother   . Heart attack Cousin 40  . Colon cancer Neg Hx     Social History Social History   Tobacco Use  . Smoking status: Current Every Day Smoker    Packs/day: 1.00    Years: 46.00    Pack years: 46.00    Types: Cigarettes  . Smokeless tobacco: Never Used  Substance Use Topics  . Alcohol use: Yes    Alcohol/week: 14.0 standard drinks    Types: 14 Cans of beer per week    Comment: drinks 2-3 beers a night  . Drug use: No     Allergies   Patient has no known allergies.   Review of Systems Review of Systems  Constitutional: Negative for chills and fever.  HENT: Positive for trouble swallowing. Negative for congestion and sore throat.   Eyes: Negative.   Respiratory: Positive for shortness of breath. Negative for chest tightness.        Reports chronic sob, stable.  Cardiovascular: Negative for chest pain.  Gastrointestinal: Negative for abdominal pain, nausea and vomiting.  Genitourinary: Negative.   Musculoskeletal: Negative for neck pain.  Skin: Negative.   Neurological: Negative for light-headedness, numbness and headaches.  Psychiatric/Behavioral: Negative.      Physical Exam Updated Vital Signs BP (!) 148/74 (BP Location: Right Arm)   Pulse 65   Temp 97.6 F (36.4 C) (Oral)   Resp 18   Ht 5\' 6"  (1.676 m)   Wt 74.8 kg   SpO2  100%   BMI 26.63 kg/m   Physical Exam Vitals signs and nursing note reviewed.  Constitutional:      Appearance: He is well-developed.  HENT:     Head: Normocephalic and atraumatic.     Mouth/Throat:     Mouth: Mucous membranes are moist.     Pharynx: Oropharynx is clear.  Eyes:     Conjunctiva/sclera: Conjunctivae normal.  Neck:     Musculoskeletal: Normal range of motion.  Cardiovascular:     Rate and Rhythm: Normal rate and regular rhythm.     Heart sounds: Normal heart sounds.  Pulmonary:     Effort: Pulmonary effort is normal.     Breath sounds: Normal breath  sounds. No stridor. No wheezing.  Abdominal:     General: Bowel sounds are normal.     Palpations: Abdomen is soft.     Tenderness: There is no abdominal tenderness.  Musculoskeletal: Normal range of motion.  Skin:    General: Skin is warm and dry.  Neurological:     Mental Status: He is alert.      ED Treatments / Results  Labs (all labs ordered are listed, but only abnormal results are displayed) Labs Reviewed - No data to display  EKG None  Radiology No results found.  Procedures Procedures (including critical care time)  Medications Ordered in ED Medications - No data to display   Initial Impression / Assessment and Plan / ED Course  I have reviewed the triage vital signs and the nursing notes.  Pertinent labs & imaging results that were available during my care of the patient were reviewed by me and considered in my medical decision making (see chart for details).        Pt with progressive dysphagia with no active food impaction.  He was offered CT imaging of chest given prior CT chest with bilateral hylar lymphadenopathy 05/29/2015, recommended CT in 3 months which has not occurred.  Pt declines this test today.  He was given referral back to Dr. Darrick Penna who he will call today for OV.  Discussed return precautions, impaction, inability to swallow water or saliva.  He was given prescription  for omeprazole in place of the protonix, less expensive and may be able to tolerate intake better - advised can open capsule and mix in food.  Prn f/u anticipated.  Final Clinical Impressions(s) / ED Diagnoses   Final diagnoses:  Dysphagia, unspecified type    ED Discharge Orders         Ordered    omeprazole (PRILOSEC) 40 MG capsule  Daily     05/21/19 0820           Burgess Amor, PA-C 05/21/19 0845    Burgess Amor, PA-C 05/21/19 Mora Bellman    Eber Hong, MD 05/22/19 [id number]

## 2019-05-21 NOTE — Discharge Instructions (Addendum)
You should continue taking an ulcer medicine as your swallowing and acid reflux may worsen without this medication.  Please call Dr. Oneida Alar for definitive treatment of your swallowing concerns.  Continue taking your home medicines, you may want to consider taking these mixed in applesauce or pudding to hide the taste. I have prescribed you a less expensive medicine than the protonix (generic prilosec) for treatment of your reflux.

## 2019-05-21 NOTE — ED Triage Notes (Signed)
Pt reports having problems with food and pills getting stuck in his throat for the past couple of months.  Denies pain.  Pt says doesn't feel like anything is in his throat at this time but says it feels like his throat is narrow.

## 2019-05-23 ENCOUNTER — Other Ambulatory Visit: Payer: Self-pay

## 2019-05-23 ENCOUNTER — Encounter: Payer: Self-pay | Admitting: *Deleted

## 2019-05-23 ENCOUNTER — Other Ambulatory Visit: Payer: Self-pay | Admitting: *Deleted

## 2019-05-23 ENCOUNTER — Encounter: Payer: Self-pay | Admitting: Nurse Practitioner

## 2019-05-23 ENCOUNTER — Ambulatory Visit (INDEPENDENT_AMBULATORY_CARE_PROVIDER_SITE_OTHER): Payer: Self-pay | Admitting: Nurse Practitioner

## 2019-05-23 VITALS — BP 131/78 | HR 87 | Temp 96.6°F | Ht 66.0 in | Wt 164.6 lb

## 2019-05-23 DIAGNOSIS — R1319 Other dysphagia: Secondary | ICD-10-CM

## 2019-05-23 DIAGNOSIS — R1310 Dysphagia, unspecified: Secondary | ICD-10-CM | POA: Insufficient documentation

## 2019-05-23 DIAGNOSIS — K219 Gastro-esophageal reflux disease without esophagitis: Secondary | ICD-10-CM

## 2019-05-23 NOTE — Assessment & Plan Note (Signed)
GERD previously required twice a day PPI to be adequately controlled.  His insurance continues to cut back his supply to once daily despite approved prior authorization on file.  At this point he states his GERD is doing okay, occasional breakthrough symptoms of about once a week or so.  Recommend he continue his PPI intake over-the-counter Tums or Rolaids as needed.  Follow-up in 3 months.

## 2019-05-23 NOTE — Assessment & Plan Note (Signed)
New onset of solid food and pill dysphagia as per HPI.  Likely multifactorial in nature.  Chronic history of GERD which we have struggled with the insurance company to get appropriate PPI covered.  He is currently on once a day PPI.  Symptoms started about 1 month ago and occurs with essentially every meal.  He does not keep enough fluids on hand and he does not have any teeth, although he does follow a soft diet because of this.  Recommended he continue his PPI, keep plenty of fluids on hand with meals.  Take 2 drinks of water prior to taking pills and drink a full glass of water with his medications.  We will proceed with upper endoscopy and possible dilation to further evaluate.  Proceed with EGD +/- dilation with Dr. Oneida Alar in near future: the risks, benefits, and alternatives have been discussed with the patient in detail. The patient states understanding and desires to proceed.  The patient is not on any anticoagulants, anxiolytics, chronic pain medications, antidepressants, antidiabetics, or iron supplements.  He does drink about 2 beers a day.  Conscious sedation should be adequate for his procedure as it was for his last with similar alcohol intake.

## 2019-05-23 NOTE — Progress Notes (Signed)
Referring Provider: Ponciano Ort [facility] Primary Care Physician:  Ponciano Ort, [facility] Primary GI:  Dr. Darrick Penna  Chief Complaint  Patient presents with  . Dysphagia    food/pills; feels like ball in throat    HPI:   Cameron Hoover is a 63 y.o. male who presents for dysphagia.  The patient was last seen in our office 10/25/2017 for GERD and constipation.  History of pyloric ulcer.  EGD on 07/20/2016 that was normal and random biopsies with chronic gastritis negative for H. pylori.  Last colonoscopy in [place] and due in 2020.  Chronic history of GERD and constipation.  At his last visit his GERD was worse and included epigastric esophageal burning 2-3 times a week, prolonged walking triggers his symptoms and typically resolves with rest.  No other GI complaints.  Recommended Protonix increased to twice a day, message to cardiology to make them aware of his symptoms consider possible cardiology work-up, follow-up in 2 months.  The patient was a no-show for his follow-up visit.  Today he states he's doing ok overall. He was on PPI twice a day through prior authorization and his insurance again later reduced it to 30 pills a day. GERD symptoms doing "pretty good" on once daily PPI. GERD breakthrough depends on what he's doing, but typically 1-2 times a week. Started having solid food and pill dysphagia about a month ago. Happens with almost every meal. Doesn't drink a lot of fluids when he takes his pills. He eats soft foods due to not having any teeth. Occasional regurgitation. Not keeping fluids on hand at meals. Denies abdominal pain, N/V, hematochezia, melena, fever, chills, unintentional weight loss. Denies URI or flu-like symptoms. Denies loss of sense of taste or smell. Denies chest pain, dyspnea, dizziness, lightheadedness, syncope, near syncope. Denies any other upper or lower GI symptoms.  Past Medical History:  Diagnosis Date  . Acute blood loss anemia 03/2016  . Back pain    . Bradycardia    a. not on BB due to this.  . Chronic combined systolic and diastolic CHF (congestive heart failure) ([facility])   . Coronary artery disease    a. previously nonobstructive. b. then severe dyspnea/acute CHF 05/2015 s/p DES to mLAD.  Marland Kitchen Duodenitis 03/2016  . Gastritis 03/2016  . GI bleed    a. 03/2016 with ABL anemia - (gastritis/duodenitis by EGD).  . Hyperlipidemia     Past Surgical History:  Procedure Laterality Date  . CARDIAC CATHETERIZATION  2007  . CARDIAC CATHETERIZATION N/A 05/28/2015   Procedure: Left Heart Cath and Cors/Grafts Angiography;  Surgeon: Lennette Bihari, MD;  Location: [facility] INVASIVE CV LAB;  Service: Cardiovascular;  Laterality: N/A;  . CARDIAC CATHETERIZATION N/A 05/28/2015   Procedure: Coronary Stent Intervention;  Surgeon: Lennette Bihari, MD;  Location: [facility] INVASIVE CV LAB;  Service: Cardiovascular;  Laterality: N/A;  . ESOPHAGOGASTRODUODENOSCOPY N/A 03/21/2016   Procedure: ESOPHAGOGASTRODUODENOSCOPY (EGD);  Surgeon: West Bali, MD;  Location: [facility];  Service: Endoscopy;  Laterality: N/A;  . ESOPHAGOGASTRODUODENOSCOPY N/A 06/20/2016   Procedure: ESOPHAGOGASTRODUODENOSCOPY (EGD);  Surgeon: West Bali, MD;  Location: [facility] SU[facility];  Service: Endoscopy;  Laterality: N/A;  1:15 pm    Current Outpatient Medications  Medication Sig Dispense Refill  . aspirin EC 81 MG tablet Take 1 tablet (81 mg total) by mouth daily. 90 tablet 3  . atorvastatin (LIPITOR) 80 MG tablet Take 80 mg by mouth daily at 6 PM.    . cholecalciferol (  VITAMIN D) 1000 units tablet Take 1,000 Units by mouth daily.    . fluticasone (FLONASE) 50 MCG/ACT nasal spray Place 1 spray into both nostrils daily.    . furosemide (LASIX) 20 MG tablet Take 1 tablet (20 mg total) by mouth daily. 90 tablet 3  . lisinopril (ZESTRIL) 5 MG tablet Take 1 tablet (5 mg total) by mouth daily. 90 tablet 2  . loratadine (CLARITIN) 10 MG tablet Take 10 mg by mouth daily.    . metoprolol succinate  (TOPROL-XL) 25 MG 24 hr tablet Take 1 tablet (25 mg total) by mouth daily. 90 tablet 3  . pantoprazole (PROTONIX) 40 MG tablet TAKE 1 TABLET BY MOUTH 2 TIMES DAILY BEFORE A MEAL. (Patient taking differently: Take 40 mg by mouth daily. ) 60 tablet 5   No current facility-administered medications for this visit.     Allergies as of 05/23/2019  . (No Known Allergies)    Family History  Problem Relation Age of Onset  . Cancer Father   . Emphysema Father   . Heart attack Brother   . Heart attack Brother   . Cancer Mother   . Diabetes Mother   . Heart attack Cousin 40  . Colon cancer Neg Hx   . Gastric cancer Neg Hx   . Esophageal cancer Neg Hx     Social History   Socioeconomic History  . Marital status: Significant Other    Spouse name: Not on file  . Number of children: Not on file  . Years of education: Not on file  . Highest education level: Not on file  Occupational History  . Not on file  Social Needs  . Financial resource strain: Not on file  . Food insecurity    Worry: Not on file    Inability: Not on file  . Transportation needs    Medical: Not on file    Non-medical: Not on file  Tobacco Use  . Smoking status: Current Every Day Smoker    Packs/day: 1.00    Years: 46.00    Pack years: 46.00    Types: Cigarettes  . Smokeless tobacco: Never Used  Substance and Sexual Activity  . Alcohol use: Yes    Alcohol/week: 14.0 standard drinks    Types: 14 Cans of beer per week    Comment: drinks 2-3 beers a night  . Drug use: No  . Sexual activity: Not on file  Lifestyle  . Physical activity    Days per week: Not on file    Minutes per session: Not on file  . Stress: Not on file  Relationships  . Social Musician on phone: Not on file    Gets together: Not on file    Attends religious service: Not on file    Active member of club or organization: Not on file    Attends meetings of clubs or organizations: Not on file    Relationship status: Not on  file  Other Topics Concern  . Not on file  Social History Narrative  . Not on file    Review of Systems: General: Negative for anorexia, weight loss, fever, chills, fatigue, weakness. ENT: Negative for hoarseness.  Admits to dysphagia as per HPI. CV: Negative for chest pain, angina, palpitations, peripheral edema.  Respiratory: Negative for dyspnea at rest, cough, sputum, wheezing.  GI: See history of present illness. Endo: Negative for unusual weight change.  Heme: Negative for bruising or bleeding. Allergy: Negative for rash  or hives.   Physical Exam: BP 131/78   Pulse 87   Temp (!) 96.6 F (35.9 C) (Temporal)   Ht 5\' 6"  (1.676 m)   Wt 164 lb 9.6 oz (74.7 kg)   BMI 26.57 kg/m  General:   Alert and oriented. Pleasant and cooperative. Well-nourished and well-developed.  Eyes:  Without icterus, sclera clear and conjunctiva pink.  Ears:  Normal auditory acuity. Cardiovascular:  S1, S2 present without murmurs appreciated. Extremities without clubbing or edema. Respiratory:  Clear to auscultation bilaterally. No wheezes, rales, or rhonchi. No distress.  Gastrointestinal:  +BS, soft, non-tender and non-distended. No HSM noted. No guarding or rebound. No masses appreciated.  Rectal:  Deferred  Musculoskalatal:  Symmetrical without gross deformities. Skin:  Intact without significant lesions or rashes. Neurologic:  Alert and oriented x4;  grossly normal neurologically. Psych:  Alert and cooperative. Normal mood and affect. Heme/Lymph/Immune: No excessive bruising noted.    05/23/2019 10:05 AM   Disclaimer: This note was dictated with voice recognition software. Similar sounding words can inadvertently be transcribed and may not be corrected upon review.

## 2019-05-23 NOTE — Patient Instructions (Addendum)
Your health issues we discussed today were:   GERD (reflux/heartburn): 1. Continue to take your acid blocker once a day 2. Let us know if you have any worsening GERD symptoms  Dysphagia (swallowing difficulties) with food and pills: 1. Continue eating a soft diet as you have been 2. When it is time to take medications, take 2 swallows of water.  Take your medications and follow with a full glass of water 3. When you are eating, keep fluids on hand and if something starts to get stuck you can drink fluids to see if this helps the past 4. We will schedule an upper endoscopy with possible dilation to further evaluate and treat your swallowing difficulties 5. Further recommendations will follow 6. If food or pills get stuck and do not pass after a significant amount of time, call our office or proceed to the emergency department  Overall I recommend:  1. Continue your other current medications 2. Call us if he has any questions or concerns 3. Follow-up in 3 months.   Because of recent events of COVID-19 ("Coronavirus"), follow CDC recommendations:  Wash your hand frequently Avoid touching your face Stay away from people who are sick If you have symptoms such as fever, cough, shortness of breath then call your healthcare provider for further guidance If you are sick, STAY AT HOME unless otherwise directed by your healthcare provider. Follow directions from state and national officials regarding staying safe   At [facility] we value your feedback. You may receive a survey about your visit today. Please share your experience as we strive to create trusting relationships with our patients to provide genuine, compassionate, quality care.  We appreciate your understanding and patience as we review any laboratory studies, imaging, and other diagnostic tests that are ordered as we care for you. Our office policy is 5 business days for review of these results, and any emergent or  urgent results are addressed in a timely manner for your best interest. If you do not hear from our office in 1 week, please contact us.   We also encourage the use of MyChart, which contains your medical information for your review as well. If you are not enrolled in this feature, an access code is on this after visit summary for your convenience. Thank you for allowing [place] to be involved in your care.  It was great to see you today!  I hope you have a great Fall!!

## 2019-08-07 ENCOUNTER — Other Ambulatory Visit: Payer: Self-pay

## 2019-08-07 ENCOUNTER — Other Ambulatory Visit (HOSPITAL_COMMUNITY)
Admission: RE | Admit: 2019-08-07 | Discharge: 2019-08-07 | Disposition: A | Discharge status: Home / Self Care | Payer: Medicaid Other | Source: Ambulatory Visit | Attending: Gastroenterology | Admitting: Gastroenterology

## 2019-08-07 DIAGNOSIS — Z20822 Contact with and (suspected) exposure to covid-19: Secondary | ICD-10-CM | POA: Diagnosis not present

## 2019-08-07 DIAGNOSIS — Z01812 Encounter for preprocedural laboratory examination: Secondary | ICD-10-CM | POA: Insufficient documentation

## 2019-08-07 LAB — SARS CORONAVIRUS 2 (TAT 6-24 HRS): SARS Coronavirus 2: NEGATIVE

## 2019-08-09 ENCOUNTER — Encounter (HOSPITAL_COMMUNITY)
Admission: RE | Disposition: A | Discharge status: Home / Self Care | Payer: Self-pay | Source: Ambulatory Visit | Attending: Gastroenterology

## 2019-08-09 ENCOUNTER — Ambulatory Visit (HOSPITAL_COMMUNITY)
Admission: RE | Admit: 2019-08-09 | Discharge: 2019-08-09 | Disposition: A | Discharge status: Home / Self Care | Payer: Self-pay | Source: Ambulatory Visit | Attending: Gastroenterology | Admitting: Gastroenterology

## 2019-08-09 ENCOUNTER — Other Ambulatory Visit: Payer: Self-pay

## 2019-08-09 ENCOUNTER — Encounter (HOSPITAL_COMMUNITY): Payer: Self-pay | Admitting: Gastroenterology

## 2019-08-09 DIAGNOSIS — I252 Old myocardial infarction: Secondary | ICD-10-CM | POA: Insufficient documentation

## 2019-08-09 DIAGNOSIS — Z79899 Other long term (current) drug therapy: Secondary | ICD-10-CM | POA: Insufficient documentation

## 2019-08-09 DIAGNOSIS — I2510 Atherosclerotic heart disease of native coronary artery without angina pectoris: Secondary | ICD-10-CM | POA: Insufficient documentation

## 2019-08-09 DIAGNOSIS — R1310 Dysphagia, unspecified: Secondary | ICD-10-CM

## 2019-08-09 DIAGNOSIS — I5042 Chronic combined systolic (congestive) and diastolic (congestive) heart failure: Secondary | ICD-10-CM | POA: Insufficient documentation

## 2019-08-09 DIAGNOSIS — Z7982 Long term (current) use of aspirin: Secondary | ICD-10-CM | POA: Insufficient documentation

## 2019-08-09 DIAGNOSIS — R1319 Other dysphagia: Secondary | ICD-10-CM

## 2019-08-09 DIAGNOSIS — F17210 Nicotine dependence, cigarettes, uncomplicated: Secondary | ICD-10-CM | POA: Insufficient documentation

## 2019-08-09 DIAGNOSIS — K219 Gastro-esophageal reflux disease without esophagitis: Secondary | ICD-10-CM | POA: Insufficient documentation

## 2019-08-09 DIAGNOSIS — E785 Hyperlipidemia, unspecified: Secondary | ICD-10-CM | POA: Insufficient documentation

## 2019-08-09 HISTORY — DX: Gastro-esophageal reflux disease without esophagitis: K21.9

## 2019-08-09 HISTORY — PX: ESOPHAGOGASTRODUODENOSCOPY: SHX5428

## 2019-08-09 HISTORY — PX: SAVORY DILATION: SHX5439

## 2019-08-09 SURGERY — EGD (ESOPHAGOGASTRODUODENOSCOPY)
Anesthesia: Moderate Sedation

## 2019-08-09 MED ORDER — SODIUM CHLORIDE 0.9 % IV SOLN
INTRAVENOUS | Status: DC
  Administered 2019-08-09: 14:00:00 1000 mL via INTRAVENOUS

## 2019-08-09 MED ORDER — LIDOCAINE VISCOUS HCL 2 % MT SOLN
OROMUCOSAL | Status: DC | PRN
  Administered 2019-08-09: 1 via OROMUCOSAL

## 2019-08-09 MED ORDER — LIDOCAINE VISCOUS HCL 2 % MT SOLN
OROMUCOSAL | Status: AC
  Filled 2019-08-09: qty 15

## 2019-08-09 MED ORDER — MEPERIDINE HCL 100 MG/ML IJ SOLN
INTRAMUSCULAR | Status: AC
  Filled 2019-08-09: qty 2

## 2019-08-09 MED ORDER — MINERAL OIL PO OIL
TOPICAL_OIL | ORAL | Status: AC
  Filled 2019-08-09: qty 30

## 2019-08-09 MED ORDER — MIDAZOLAM HCL 5 MG/5ML IJ SOLN
INTRAMUSCULAR | Status: AC
  Filled 2019-08-09: qty 10

## 2019-08-09 MED ORDER — MIDAZOLAM HCL 5 MG/5ML IJ SOLN
INTRAMUSCULAR | Status: DC | PRN
  Administered 2019-08-09 (×2): 2 mg via INTRAVENOUS
  Administered 2019-08-09: 1 mg via INTRAVENOUS

## 2019-08-09 MED ORDER — STERILE WATER FOR IRRIGATION IR SOLN
Status: DC | PRN
  Administered 2019-08-09: 2.5 mL

## 2019-08-09 MED ORDER — MEPERIDINE HCL 100 MG/ML IJ SOLN
INTRAMUSCULAR | Status: DC | PRN
  Administered 2019-08-09 (×2): 25 mg via INTRAVENOUS

## 2019-08-09 NOTE — Discharge Instructions (Signed)
I dilated your esophagus. I DID NOT SEE A DEFINITE NARROWING IN YOUR esophagus.    DRINK WATER TO KEEP YOUR URINE LIGHT YELLOW.  AVOID REFLUX TRIGGERS. SEE INFO BELOW.   CONTINUE PROTONIX. TAKE 30 MINUTES PRIOR TO MEALS ONCE OR TWICE DAILY.   FOLLOW UP IN 4 MOS. UPPER ENDOSCOPY AFTER CARE Read the instructions outlined below and refer to this sheet in the next week. These discharge instructions provide you with general information on caring for yourself after you leave the hospital. While your treatment has been planned according to the most current medical practices available, unavoidable complications occasionally occur. If you have any problems or questions after discharge, call DR. FIELDS, [phone].  ACTIVITY  You may resume your regular activity, but move at a slower pace for the next 24 hours.   Take frequent rest periods for the next 24 hours.   Walking will help get rid of the air and reduce the bloated feeling in your belly (abdomen).   No driving for 24 hours (because of the medicine (anesthesia) used during the test).   You may shower.   Do not sign any important legal documents or operate any machinery for 24 hours (because of the anesthesia used during the test).    NUTRITION  Drink plenty of fluids.   You may resume your normal diet as instructed by your doctor.   Begin with a light meal and progress to your normal diet. Heavy or fried foods are harder to digest and may make you feel sick to your stomach (nauseated).   Avoid alcoholic beverages for 24 hours or as instructed.    MEDICATIONS  You may resume your normal medications.   WHAT YOU CAN EXPECT TODAY  Some feelings of bloating in the abdomen.   Passage of more gas than usual.    IF YOU HAD A BIOPSY TAKEN DURING THE UPPER ENDOSCOPY:  Eat a soft diet IF YOU HAVE NAUSEA, BLOATING, ABDOMINAL PAIN, OR VOMITING.    FINDING OUT THE RESULTS OF YOUR TEST Not all test results are available  during your visit. DR. Darrick Penna WILL CALL YOU WITHIN 7 DAYS OF YOUR PROCEDUE WITH YOUR RESULTS. Do not assume everything is normal if you have not heard from DR. FIELDS IN ONE WEEK, CALL HER OFFICE AT [phone].  SEEK IMMEDIATE MEDICAL ATTENTION AND CALL THE OFFICE: [phone] IF:  You have more than a spotting of blood in your stool.   Your belly is swollen (abdominal distention).   You are nauseated or vomiting.   You have a temperature over 101F.   You have abdominal pain or discomfort that is severe or gets worse throughout the day.   DYSPHAGIA DYSPHAGIA can be caused by stomach acid backing up into the tube that carries food from the mouth down to the stomach (lower esophagus).  TREATMENT There are a number of medicines used to treat DYSPHAGIA including: Antacids.  Proton-pump inhibitors: PANTOPRAZOLE TAGAMET OR PEPCID Lifestyle and home remedies TO CONTROL HEARTBURN You may eliminate or reduce the frequency of heartburn by making the following lifestyle changes:  . Control your weight. Being overweight is a major risk factor for heartburn and GERD. Excess pounds put pressure on your abdomen, pushing up your stomach and causing acid to back up into your esophagus.   . Eat smaller meals. 4 TO 6 MEALS A DAY. This reduces pressure on the lower esophageal sphincter, helping to prevent the valve from opening and acid from washing back into your esophagus.   Marland Kitchen  Loosen your belt. Clothes that fit tightly around your waist put pressure on your abdomen and the lower esophageal sphincter.   . Eliminate heartburn triggers. Everyone has specific triggers. Common triggers such as fatty or fried foods, spicy food, tomato sauce, carbonated beverages, alcohol, chocolate, mint, garlic, onion, caffeine and nicotine may make heartburn worse.   Marland Kitchen Avoid stooping or bending. Tying your shoes is OK. Bending over for longer periods to weed your garden isn't, especially soon after eating.   . Don't  lie down after a meal. Wait at least three to four hours after eating before going to bed, and don't lie down right after eating.   Alternative medicine . Several home remedies exist for treating GERD, but they provide only temporary relief. They include drinking baking soda (sodium bicarbonate) added to water or drinking other fluids such as baking soda mixed with cream of tartar and water. . Although these liquids create temporary relief by neutralizing, washing away or buffering acids, eventually they aggravate the situation by adding gas and fluid to your stomach, increasing pressure and causing more acid reflux. Further, adding more sodium to your diet may increase your blood pressure and add stress to your heart, and excessive bicarbonate ingestion can alter the acid-base balance in your body.    We will have office call you to move your follow up appointment , office will  call

## 2019-08-09 NOTE — Op Note (Addendum)
[facility] Patient Name: Cameron Hoover Procedure Date: 08/09/2019 3:10 PM MRN: [id number] Date of Birth: 1955-09-23 Attending MD: Jonette Eva MD, MD CSN: [id number] Age: 64 Admit Type: Outpatient Procedure:                Upper GI endoscopy WITH ESOPHAGEAL DILATION Indications:              Dysphagia. PT NOE UNINSURED AND GETTING PPI                            SPORADICALLY. Providers:                Jonette Eva MD, MD, Buel Ream. Thomasena Edis RN, RN, Nena Polio, RN Referring MD:             Lytle Michaels. Kim Medicines:                Meperidine 75 mg IV, Midazolam 5 mg IV Complications:            No immediate complications. Estimated Blood Loss:     Estimated blood loss: none. Procedure:                Pre-Anesthesia Assessment:                           - Prior to the procedure, a History and Physical                            was performed, and patient medications and                            allergies were reviewed. The patient's tolerance of                            previous anesthesia was also reviewed. The risks                            and benefits of the procedure and the sedation                            options and risks were discussed with the patient.                            All questions were answered, and informed consent                            was obtained. Prior Anticoagulants: The patient has                            taken no previous anticoagulant or antiplatelet                            agents except for aspirin. ASA Grade Assessment: II                            -  A patient with mild systemic disease. After                            reviewing the risks and benefits, the patient was                            deemed in satisfactory condition to undergo the                            procedure. After obtaining informed consent, the                            endoscope was passed under direct vision.                             Throughout the procedure, the patient's blood                            pressure, pulse, and oxygen saturations were                            monitored continuously. The GIF-H190 ([id number])                            scope was introduced through the mouth, and                            advanced to the second part of duodenum. The upper                            GI endoscopy was somewhat difficult due to the                            patient's agitation. Successful completion of the                            procedure was aided by straightening and shortening                            the scope to obtain bowel loop reduction and                            COLOWRAP. The patient tolerated the procedure                            fairly well. Scope In: 3:42:35 PM Scope Out: 3:49:52 PM Total Procedure Duration: 0 hours 7 minutes 17 seconds  Findings:      No endoscopic abnormality was evident in the esophagus to explain the       patient's complaint of dysphagia. A guidewire was placed and the scope       was withdrawn. Dilation was performed with a Savary dilator with mild       resistance at 16 mm and 17 mm DUE TO POSSIBLE OCCULT ESOPHAGEAL WEB.  The entire examined stomach was normal.      The examined duodenum was normal. Impression:               - DYSPHAGIA MOST LIKELY DUE TO UNCONTROLLED GERD OR                            ESOPHAGEAL MOTILITY DISORDER. Moderate Sedation:      Moderate (conscious) sedation was administered by the endoscopy nurse       and supervised by the endoscopist. The following parameters were       monitored: oxygen saturation, heart rate, blood pressure, and response       to care. Total physician intraservice time was 20 minutes. Recommendation:           - Patient has a contact number available for                            emergencies. The signs and symptoms of potential                            delayed complications were discussed with the                             patient. Return to normal activities tomorrow.                            Written discharge instructions were provided to the                            patient.                           - Low fat diet. AVOID REFLUX TRIGGERS.                           - Continue present medications. PROTONIX ONCE OR                            TWICE DAILY.                           - Return to GI office in 4 months. Procedure Code(s):        --- Professional ---                           [phone], Esophagogastroduodenoscopy, flexible,                            transoral; with insertion of guide wire followed by                            passage of dilator(s) through esophagus over guide                            wire  G0500, Moderate sedation services provided by the                            same physician or other qualified health care                            professional performing a gastrointestinal                            endoscopic service that sedation supports,                            requiring the presence of an independent trained                            observer to assist in the monitoring of the                            patient's level of consciousness and physiological                            status; initial 15 minutes of intra-service time;                            patient age 50 years or older (additional time may                            be reported with [id number], as appropriate) Diagnosis Code(s):        --- Professional ---                           R13.10, Dysphagia, unspecified CPT copyright 2019 American Medical Association. All rights reserved. The codes documented in this report are preliminary and upon coder review may  be revised to meet current compliance requirements. Jonette Eva, MD Jonette Eva MD, MD 08/09/2019 4:12:15 PM This report has been signed electronically. Number of Addenda: 0

## 2019-08-09 NOTE — H&P (Signed)
Primary Care Physician:  Alanson Puls, [facility] Primary Gastroenterologist:  Dr. Oneida Alar  Pre-Procedure History & Physical: HPI:  Cameron Hoover is a 64 y.o. male here for DYSPHAGIA.  Past Medical History:  Diagnosis Date  . Acute blood loss anemia 03/2016  . Back pain   . Bradycardia    a. not on BB due to this.  . Chronic combined systolic and diastolic CHF (congestive heart failure) ([place])   . Coronary artery disease    a. previously nonobstructive. b. then severe dyspnea/acute CHF 05/2015 s/p DES to mLAD.  Marland Kitchen Duodenitis 03/2016  . Gastritis 03/2016  . GERD (gastroesophageal reflux disease)   . GI bleed    a. 03/2016 with ABL anemia - (gastritis/duodenitis by EGD).  . Hyperlipidemia   . Myocardial infarction [facility]) 05/2015    Past Surgical History:  Procedure Laterality Date  . CARDIAC CATHETERIZATION  2007  . CARDIAC CATHETERIZATION N/A 05/28/2015   Procedure: Left Heart Cath and Cors/Grafts Angiography;  Surgeon: Troy Sine, MD;  Location: [place] [facility];  Service: Cardiovascular;  Laterality: N/A;  . CARDIAC CATHETERIZATION N/A 05/28/2015   Procedure: Coronary Stent Intervention;  Surgeon: Troy Sine, MD;  Location: [place] [facility];  Service: Cardiovascular;  Laterality: N/A;  . ESOPHAGOGASTRODUODENOSCOPY N/A 03/21/2016   Procedure: ESOPHAGOGASTRODUODENOSCOPY (EGD);  Surgeon: Danie Binder, MD;  [facility];  Service: Endoscopy;  Laterality: N/A;  . ESOPHAGOGASTRODUODENOSCOPY N/A 06/20/2016   Procedure: ESOPHAGOGASTRODUODENOSCOPY (EGD);  Surgeon: Danie Binder, MD;  Location: [facility];  Service: Endoscopy;  Laterality: N/A;  1:15 pm  . FACIAL RECONSTRUCTION SURGERY Left 2004   facial injury that required surgical repair    Prior to Admission medications   Medication Sig Start Date End Date Taking? Authorizing Provider  albuterol (VENTOLIN HFA) 108 (90 Base) MCG/ACT inhaler Inhale 2 puffs into the lungs 2 (two) times daily as needed.  06/02/19  Yes [provider]  aspirin EC 81 MG tablet Take 1 tablet (81 mg total) by mouth daily. 06/03/16  Yes Dorothy Spark, MD  atorvastatin (LIPITOR) 80 MG tablet Take 80 mg by mouth daily at 6 PM.   Yes [provider]  cholecalciferol (VITAMIN D) 1000 units tablet Take 1,000 Units by mouth daily.   Yes [provider]  furosemide (LASIX) 20 MG tablet Take 1 tablet (20 mg total) by mouth daily. 03/06/19  Yes Dorothy Spark, MD  lisinopril (ZESTRIL) 5 MG tablet Take 1 tablet (5 mg total) by mouth daily. 04/30/19  Yes Dorothy Spark, MD  loratadine (CLARITIN) 10 MG tablet Take 10 mg by mouth daily.   Yes [provider]  metoprolol succinate (TOPROL-XL) 25 MG 24 hr tablet Take 1 tablet (25 mg total) by mouth daily. 03/06/19  Yes Dorothy Spark, MD  pantoprazole (PROTONIX) 40 MG tablet TAKE 1 TABLET BY MOUTH 2 TIMES DAILY BEFORE A MEAL. Patient taking differently: Take 40 mg by mouth daily.  10/05/18  Yes Mahala Menghini, PA-C  fluticasone (FLONASE) 50 MCG/ACT nasal spray Place 1 spray into both nostrils daily as needed for allergies.     [provider]    Allergies as of 05/23/2019  . (No Known Allergies)    Family History  Problem Relation Age of Onset  . Cancer Father   . Emphysema Father   . Heart attack Brother   . Heart attack Brother   . Cancer Mother   . Diabetes Mother   .  Heart attack Cousin 40  . Colon cancer Neg Hx   . Gastric cancer Neg Hx   . Esophageal cancer Neg Hx     Social History   Socioeconomic History  . Marital status: Married    Spouse name: Not on file  . Number of children: Not on file  . Years of education: Not on file  . Highest education level: Not on file  Occupational History  . Not on file  Tobacco Use  . Smoking status: Current Every Day Smoker    Packs/day: 1.00    Years: 46.00    Pack years: 46.00    Types: Cigarettes  . Smokeless tobacco: Never Used  Substance and Sexual  Activity  . Alcohol use: Yes    Alcohol/week: 14.0 standard drinks    Types: 14 Cans of beer per week    Comment: drinks 2-3 beers a night  . Drug use: Yes    Types: Marijuana  . Sexual activity: Not on file  Other Topics Concern  . Not on file  Social History Narrative  . Not on file   Social Determinants of Health   Financial Resource Strain:   . Difficulty of Paying Living Expenses: Not on file  Food Insecurity:   . Worried About Programme researcher, broadcasting/film/video in the Last Year: Not on file  . Ran Out of Food in the Last Year: Not on file  Transportation Needs:   . Lack of Transportation (Medical): Not on file  . Lack of Transportation (Non-Medical): Not on file  Physical Activity:   . Days of Exercise per Week: Not on file  . Minutes of Exercise per Session: Not on file  Stress:   . Feeling of Stress : Not on file  Social Connections:   . Frequency of Communication with Friends and Family: Not on file  . Frequency of Social Gatherings with Friends and Family: Not on file  . Attends Religious Services: Not on file  . Active Member of Clubs or Organizations: Not on file  . Attends Banker Meetings: Not on file  . Marital Status: Not on file  Intimate Partner Violence:   . Fear of Current or Ex-Partner: Not on file  . Emotionally Abused: Not on file  . Physically Abused: Not on file  . Sexually Abused: Not on file    Review of Systems: See HPI, otherwise negative ROS   Physical Exam: BP (!) 149/75   Pulse 75   Temp 98.4 F (36.9 C) (Oral)   Resp 11   Ht 5\' 6"  (1.676 m)   Wt 74.8 kg   SpO2 100%   BMI 26.63 kg/m  General:   Alert,  pleasant and cooperative in NAD Head:  Normocephalic and atraumatic. Neck:  Supple; Lungs:  Clear throughout to auscultation.    Heart:  Regular rate and rhythm. Abdomen:  Soft, nontender and nondistended. Normal bowel sounds, without guarding, and without rebound.   Neurologic:  Alert and  oriented x4;  grossly normal  neurologically.  Impression/Plan:    DYSPHAGIA  PLAN:  EGD/DIL TODAY. DISCUSSED PROCEDURE, BENEFITS, & RISKS: < 1% chance of medication reaction, bleeding, ASPIRATION, OR perforation.

## 2019-08-12 ENCOUNTER — Encounter: Payer: Self-pay | Admitting: Gastroenterology

## 2019-08-27 ENCOUNTER — Ambulatory Visit: Payer: Self-pay | Admitting: Nurse Practitioner

## 2019-09-21 ENCOUNTER — Other Ambulatory Visit: Payer: Self-pay | Admitting: Cardiology

## 2019-09-21 DIAGNOSIS — I2510 Atherosclerotic heart disease of native coronary artery without angina pectoris: Secondary | ICD-10-CM

## 2019-09-21 DIAGNOSIS — E782 Mixed hyperlipidemia: Secondary | ICD-10-CM

## 2019-11-07 ENCOUNTER — Other Ambulatory Visit: Payer: Self-pay

## 2019-11-07 ENCOUNTER — Encounter (HOSPITAL_COMMUNITY): Payer: Self-pay | Admitting: *Deleted

## 2019-11-07 ENCOUNTER — Emergency Department (HOSPITAL_COMMUNITY): Payer: Medicaid Other

## 2019-11-07 ENCOUNTER — Emergency Department (HOSPITAL_COMMUNITY)
Admission: EM | Admit: 2019-11-07 | Discharge: 2019-11-07 | Disposition: A | Discharge status: Home / Self Care | Payer: Medicaid Other | Attending: Emergency Medicine | Admitting: Emergency Medicine

## 2019-11-07 DIAGNOSIS — M542 Cervicalgia: Secondary | ICD-10-CM | POA: Insufficient documentation

## 2019-11-07 DIAGNOSIS — Z79899 Other long term (current) drug therapy: Secondary | ICD-10-CM | POA: Insufficient documentation

## 2019-11-07 DIAGNOSIS — Y929 Unspecified place or not applicable: Secondary | ICD-10-CM | POA: Insufficient documentation

## 2019-11-07 DIAGNOSIS — I110 Hypertensive heart disease with heart failure: Secondary | ICD-10-CM | POA: Insufficient documentation

## 2019-11-07 DIAGNOSIS — S0101XA Laceration without foreign body of scalp, initial encounter: Secondary | ICD-10-CM | POA: Insufficient documentation

## 2019-11-07 DIAGNOSIS — S199XXA Unspecified injury of neck, initial encounter: Secondary | ICD-10-CM | POA: Insufficient documentation

## 2019-11-07 DIAGNOSIS — Y904 Blood alcohol level of 80-99 mg/100 ml: Secondary | ICD-10-CM | POA: Insufficient documentation

## 2019-11-07 DIAGNOSIS — F10929 Alcohol use, unspecified with intoxication, unspecified: Secondary | ICD-10-CM | POA: Insufficient documentation

## 2019-11-07 DIAGNOSIS — S0990XA Unspecified injury of head, initial encounter: Secondary | ICD-10-CM

## 2019-11-07 DIAGNOSIS — F17210 Nicotine dependence, cigarettes, uncomplicated: Secondary | ICD-10-CM | POA: Insufficient documentation

## 2019-11-07 DIAGNOSIS — Y9389 Activity, other specified: Secondary | ICD-10-CM | POA: Insufficient documentation

## 2019-11-07 DIAGNOSIS — I5042 Chronic combined systolic (congestive) and diastolic (congestive) heart failure: Secondary | ICD-10-CM | POA: Insufficient documentation

## 2019-11-07 DIAGNOSIS — S0001XA Abrasion of scalp, initial encounter: Secondary | ICD-10-CM

## 2019-11-07 DIAGNOSIS — I2510 Atherosclerotic heart disease of native coronary artery without angina pectoris: Secondary | ICD-10-CM | POA: Insufficient documentation

## 2019-11-07 DIAGNOSIS — R519 Headache, unspecified: Secondary | ICD-10-CM | POA: Insufficient documentation

## 2019-11-07 DIAGNOSIS — Z23 Encounter for immunization: Secondary | ICD-10-CM | POA: Insufficient documentation

## 2019-11-07 DIAGNOSIS — Y999 Unspecified external cause status: Secondary | ICD-10-CM | POA: Insufficient documentation

## 2019-11-07 LAB — CBC WITH DIFFERENTIAL/PLATELET
Abs Immature Granulocytes: 0.05 10*3/uL (ref 0.00–0.07)
Basophils Absolute: 0 10*3/uL (ref 0.0–0.1)
Basophils Relative: 0 %
Eosinophils Absolute: 0 10*3/uL (ref 0.0–0.5)
Eosinophils Relative: 0 %
HCT: 51.1 % (ref 39.0–52.0)
Hemoglobin: 16.8 g/dL (ref 13.0–17.0)
Immature Granulocytes: 0 %
Lymphocytes Relative: 9 %
Lymphs Abs: 1.2 10*3/uL (ref 0.7–4.0)
MCH: 30.9 pg (ref 26.0–34.0)
MCHC: 32.9 g/dL (ref 30.0–36.0)
MCV: 94.1 fL (ref 80.0–100.0)
Monocytes Absolute: 0.7 10*3/uL (ref 0.1–1.0)
Monocytes Relative: 5 %
Neutro Abs: 11.2 10*3/uL — ABNORMAL HIGH (ref 1.7–7.7)
Neutrophils Relative %: 86 %
Platelets: 286 10*3/uL (ref 150–400)
RBC: 5.43 MIL/uL (ref 4.22–5.81)
RDW: 13.7 % (ref 11.5–15.5)
WBC: 13.2 10*3/uL — ABNORMAL HIGH (ref 4.0–10.5)
nRBC: 0 % (ref 0.0–0.2)

## 2019-11-07 LAB — COMPREHENSIVE METABOLIC PANEL
ALT: 57 U/L — ABNORMAL HIGH (ref 0–44)
AST: 46 U/L — ABNORMAL HIGH (ref 15–41)
Albumin: 4 g/dL (ref 3.5–5.0)
Alkaline Phosphatase: 131 U/L — ABNORMAL HIGH (ref 38–126)
Anion gap: 13 (ref 5–15)
BUN: 15 mg/dL (ref 8–23)
CO2: 22 mmol/L (ref 22–32)
Calcium: 9.4 mg/dL (ref 8.9–10.3)
Chloride: 101 mmol/L (ref 98–111)
Creatinine, Ser: 0.95 mg/dL (ref 0.61–1.24)
GFR calc Af Amer: >60 mL/min (ref >60)
GFR calc non Af Amer: >60 mL/min (ref >60)
Glucose, Bld: 106 mg/dL — ABNORMAL HIGH (ref 70–99)
Potassium: 4 mmol/L (ref 3.5–5.1)
Sodium: 136 mmol/L (ref 135–145)
Total Bilirubin: 0.6 mg/dL (ref 0.3–1.2)
Total Protein: 8.3 g/dL — ABNORMAL HIGH (ref 6.5–8.1)

## 2019-11-07 LAB — ETHANOL: Alcohol, Ethyl (B): 99 mg/dL — ABNORMAL HIGH (ref <10)

## 2019-11-07 MED ORDER — FENTANYL CITRATE (PF) 100 MCG/2ML IJ SOLN
50.0000 ug | Freq: Once | INTRAMUSCULAR | Status: AC
  Administered 2019-11-07: 05:00:00 50 ug via INTRAVENOUS
  Filled 2019-11-07: qty 2

## 2019-11-07 MED ORDER — TETANUS-DIPHTH-ACELL PERTUSSIS 5-2.5-18.5 LF-MCG/0.5 IM SUSP
0.5000 mL | Freq: Once | INTRAMUSCULAR | Status: AC
  Administered 2019-11-07: 0.5 mL via INTRAMUSCULAR
  Filled 2019-11-07: qty 0.5

## 2019-11-07 MED ORDER — OXYCODONE-ACETAMINOPHEN 5-325 MG PO TABS: 1.0000 | ORAL_TABLET | Freq: Four times a day (QID) | ORAL | 0 refills | Status: DC | PRN

## 2019-11-07 MED ORDER — OXYCODONE-ACETAMINOPHEN 5-325 MG PO TABS
1.0000 | ORAL_TABLET | Freq: Once | ORAL | Status: AC
  Administered 2019-11-07: 07:00:00 1 via ORAL
  Filled 2019-11-07: qty 1
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
## 2019-11-07 NOTE — ED Triage Notes (Signed)
The pt has not taken any of his meds  Since he retired a year ago

## 2019-11-07 NOTE — ED Notes (Signed)
Thurston Hole, girlfriend, [phone] would like an update when available

## 2019-11-18 ENCOUNTER — Encounter (HOSPITAL_COMMUNITY): Payer: Self-pay | Admitting: Emergency Medicine

## 2019-11-18 ENCOUNTER — Emergency Department (HOSPITAL_COMMUNITY)
Admission: EM | Admit: 2019-11-18 | Discharge: 2019-11-18 | Disposition: A | Discharge status: Home / Self Care | Payer: Medicaid Other | Attending: Emergency Medicine | Admitting: Emergency Medicine

## 2019-11-18 DIAGNOSIS — Z4802 Encounter for removal of sutures: Secondary | ICD-10-CM | POA: Insufficient documentation

## 2019-11-18 NOTE — Discharge Instructions (Addendum)
Return for signs of infection.  Shower as normal.  Tylenol for pain.

## 2019-11-18 NOTE — ED Triage Notes (Signed)
Pt arrives to have staples removed from back of head.
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
## 2019-12-26 ENCOUNTER — Ambulatory Visit: Payer: Self-pay | Admitting: Nurse Practitioner

## 2020-01-24 ENCOUNTER — Other Ambulatory Visit: Payer: Self-pay | Admitting: Cardiology

## 2020-01-24 DIAGNOSIS — I2510 Atherosclerotic heart disease of native coronary artery without angina pectoris: Secondary | ICD-10-CM

## 2020-01-24 DIAGNOSIS — E782 Mixed hyperlipidemia: Secondary | ICD-10-CM

## 2020-01-30 ENCOUNTER — Other Ambulatory Visit: Payer: Self-pay | Admitting: Cardiology

## 2020-01-30 DIAGNOSIS — E785 Hyperlipidemia, unspecified: Secondary | ICD-10-CM

## 2020-01-30 DIAGNOSIS — I5021 Acute systolic (congestive) heart failure: Secondary | ICD-10-CM

## 2020-01-30 DIAGNOSIS — I214 Non-ST elevation (NSTEMI) myocardial infarction: Secondary | ICD-10-CM

## 2020-02-26 ENCOUNTER — Other Ambulatory Visit: Payer: Self-pay | Admitting: Cardiology

## 2020-02-26 DIAGNOSIS — E785 Hyperlipidemia, unspecified: Secondary | ICD-10-CM

## 2020-02-26 DIAGNOSIS — I5021 Acute systolic (congestive) heart failure: Secondary | ICD-10-CM

## 2020-02-26 DIAGNOSIS — I214 Non-ST elevation (NSTEMI) myocardial infarction: Secondary | ICD-10-CM

## 2020-02-27 ENCOUNTER — Other Ambulatory Visit: Payer: Self-pay | Admitting: Cardiology

## 2020-02-27 DIAGNOSIS — I2510 Atherosclerotic heart disease of native coronary artery without angina pectoris: Secondary | ICD-10-CM

## 2020-02-27 DIAGNOSIS — E782 Mixed hyperlipidemia: Secondary | ICD-10-CM

## 2020-03-05 ENCOUNTER — Encounter: Payer: Self-pay | Admitting: Nurse Practitioner

## 2020-03-05 ENCOUNTER — Other Ambulatory Visit: Payer: Self-pay

## 2020-03-05 ENCOUNTER — Ambulatory Visit (INDEPENDENT_AMBULATORY_CARE_PROVIDER_SITE_OTHER): Payer: Self-pay | Admitting: Nurse Practitioner

## 2020-03-05 VITALS — BP 154/82 | HR 80 | Temp 97.3°F | Ht 66.0 in | Wt 165.2 lb

## 2020-03-05 DIAGNOSIS — R1319 Other dysphagia: Secondary | ICD-10-CM

## 2020-03-05 DIAGNOSIS — K219 Gastro-esophageal reflux disease without esophagitis: Secondary | ICD-10-CM

## 2020-03-05 DIAGNOSIS — R1310 Dysphagia, unspecified: Secondary | ICD-10-CM

## 2020-03-05 MED ORDER — PANTOPRAZOLE SODIUM 40 MG PO TBEC: 40.0000 mg | DELAYED_RELEASE_TABLET | Freq: Every day | ORAL | 5 refills | Status: DC

## 2020-03-05 NOTE — Assessment & Plan Note (Signed)
GERD symptoms doing moderately well at this point.  The big issue is the patient is not able to take his Protonix regularly because he is currently unemployed and uninsured.  I was able to find a GoodRx coupon for him to a for Protonix for just under $7 a month which she states is affordable.  I have printed a prescription and a coupon for him.  Recommend he take this daily to help prevent worsening GERD symptoms and recurrent dysphagia.  Follow-up in 6 months.

## 2020-03-05 NOTE — Progress Notes (Signed)
CC'ED TO PCP 

## 2020-03-05 NOTE — Progress Notes (Signed)
Referring Provider: Ponciano Ort [facility] Primary Care Physician:  Ponciano Ort, [facility] Primary GI:  Dr. Jena Gauss (in the absence of Dr. Darrick Penna); pending Dr. Marletta Lor  Chief Complaint  Patient presents with  . Gastroesophageal Reflux    f/u  . Dysphagia    doing okay    HPI:   LOWELL MAKARA is a 64 y.o. male who presents for follow-up on dysphagia.  The patient was last seen in our office 05/23/2019 for GERD, and dysphagia.  History of pyloric ulcer.  EGD in 2017 essentially normal with random biopsies with chronic gastritis negative for H. pylori.  Last colonoscopy in [place] and was due in 2020.  At his last visit noted PPI twice daily through prior authorization which was then reduced by insurance to 30 pills a month.  GERD symptoms doing pretty good on once daily PPI.  Breakthrough typically 1-2 times a week depending on what he is doing and intake.  Started having dysphagia about a month prior with every meal.  Eating soft foods due to no teeth and dentition is likely a complicating factor.  Occasional regurgitation.  Not drinking adequate fluids with pills, not keeping fluids on hand at meals.  We discussed chewing and swallowing precautions including keeping adequate fluids on hand for pills and meals.  Continue PPI.  Schedule upper endoscopy with possible dilation.  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  Overall noted dysphagia most likely due to uncontrolled GERD or esophageal motility disorder given his sporadic use of PPIs because he is uninsured.  Recommended lifestyle changes, trigger avoidance, Protonix 1-2 times a day.  Today he states he's doing ok overall. Still with intermittent GERD symptoms. No insurance currently so not always able to take his medication. I printed a GoodRx coupon for [facility] pharmacy for $6.84 a month for Protonix 40 mg which he states he would be able  to afford. Swallowing doing ok overall, crushes his pills mostly but has started being able to swallow pills. Doing ok with solid foods. Denies abdominal pain, N/V, hematochezia, melena, fever, chills, unintentional weight loss. Denies URI or flu-like symptoms. Denies loss of sense of taste or smell. The patient has received COVID-19 vaccination(s). Denies chest pain, dyspnea, dizziness, lightheadedness, syncope, near syncope. Denies any other upper or lower GI symptoms.  Past Medical History:  Diagnosis Date  . Acute blood loss anemia 03/2016  . Back pain   . Bradycardia    a. not on BB due to this.  . Chronic combined systolic and diastolic CHF (congestive heart failure) (HCC)   . Coronary artery disease    a. previously nonobstructive. b. then severe dyspnea/acute CHF 05/2015 s/p DES to mLAD.  Marland Kitchen Duodenitis 03/2016  . Gastritis 03/2016  . GERD (gastroesophageal reflux disease)   . GI bleed    a. 03/2016 with ABL anemia - (gastritis/duodenitis by EGD).  . Hyperlipidemia   . Myocardial infarction [facility]) 05/2015    Past Surgical History:  Procedure Laterality Date  . CARDIAC CATHETERIZATION  2007  . CARDIAC CATHETERIZATION N/A 05/28/2015   Procedure: Left Heart Cath and Cors/Grafts Angiography;  Surgeon: Lennette Bihari, MD;  Location: [facility] INVASIVE CV LAB;  Service: Cardiovascular;  Laterality: N/A;  . CARDIAC CATHETERIZATION N/A 05/28/2015   Procedure: Coronary Stent Intervention;  Surgeon: Lennette Bihari, MD;  Location: [facility] INVASIVE CV LAB;  Service: Cardiovascular;  Laterality: N/A;  . ESOPHAGOGASTRODUODENOSCOPY N/A  03/21/2016   Procedure: ESOPHAGOGASTRODUODENOSCOPY (EGD);  Surgeon: West Bali, MD;  [facility];  Service: Endoscopy;  Laterality: N/A;  . ESOPHAGOGASTRODUODENOSCOPY N/A 06/20/2016   Procedure: ESOPHAGOGASTRODUODENOSCOPY (EGD);  Surgeon: West Bali, MD;  [facility]: [facility];  Service: Endoscopy;  Laterality: N/A;  1:15 pm  . ESOPHAGOGASTRODUODENOSCOPY  N/A 08/09/2019   Procedure: ESOPHAGOGASTRODUODENOSCOPY (EGD);  Surgeon: West Bali, MD;  [facility]: [facility];  Service: Endoscopy;  Laterality: N/A;  2:45pm  . FACIAL RECONSTRUCTION SURGERY Left 2004   facial injury that required surgical repair  . SAVORY DILATION N/A 08/09/2019   Procedure: SAVORY DILATION;  Surgeon: West Bali, MD;  [facility]: [facility];  Service: Endoscopy;  Laterality: N/A;    Current Outpatient Medications  Medication Sig Dispense Refill  . albuterol (VENTOLIN HFA) 108 (90 Base) MCG/ACT inhaler Inhale 2 puffs into the lungs 2 (two) times daily as needed.    Marland Kitchen aspirin EC 81 MG tablet Take 1 tablet (81 mg total) by mouth daily. 90 tablet 3  . atorvastatin (LIPITOR) 80 MG tablet Take 1 tablet (80 mg total) by mouth daily at 6 PM. Please make overdue appt with Dr. Delton See before anymore refills. 2nd attempt 15 tablet 0  . cholecalciferol (VITAMIN D) 1000 units tablet Take 1,000 Units by mouth daily.    . fluticasone (FLONASE) 50 MCG/ACT nasal spray Place 1 spray into both nostrils daily as needed for allergies.     Marland Kitchen lisinopril (ZESTRIL) 5 MG tablet Take 1 tablet (5 mg total) by mouth daily. Please make overdue appt with Dr. Delton See before anymore refills. 1st attempt 30 tablet 0  . loratadine (CLARITIN) 10 MG tablet Take 10 mg by mouth daily.    . metoprolol succinate (TOPROL-XL) 25 MG 24 hr tablet Take 1 tablet (25 mg total) by mouth daily. 90 tablet 3  . pantoprazole (PROTONIX) 40 MG tablet Take 1 tablet (40 mg total) by mouth daily. 30 tablet 5   No current facility-administered medications for this visit.    Allergies as of 03/05/2020  . (No Known Allergies)    Family History  Problem Relation Age of Onset  . Cancer Father   . Emphysema Father   . Heart attack Brother   . Heart attack Brother   . Cancer Mother   . Diabetes Mother   . Heart attack Cousin 40  . Colon cancer Neg Hx   . Gastric cancer Neg Hx   . Esophageal cancer Neg Hx      Social History   Socioeconomic History  . Marital status: Married    Spouse name: Not on file  . Number of children: Not on file  . Years of education: Not on file  . Highest education level: Not on file  Occupational History  . Not on file  Tobacco Use  . Smoking status: Current Every Day Smoker    Packs/day: 1.00    Years: 46.00    Pack years: 46.00    Types: Cigarettes  . Smokeless tobacco: Never Used  Vaping Use  . Vaping Use: Never used  Substance and Sexual Activity  . Alcohol use: Yes    Alcohol/week: 14.0 standard drinks    Types: 14 Cans of beer per week    Comment: drinks 2-3 beers a night  . Drug use: Yes    Types: Marijuana    Comment: currently not using any marijuana  . Sexual activity: Not on file  Other Topics Concern  .  Not on file  Social History Narrative  . Not on file   Social Determinants of Health   Financial Resource Strain:   . Difficulty of Paying Living Expenses:   Food Insecurity:   . Worried About Programme researcher, broadcasting/film/video in the Last Year:   . Barista in the Last Year:   Transportation Needs:   . Freight forwarder (Medical):   Marland Kitchen Lack of Transportation (Non-Medical):   Physical Activity:   . Days of Exercise per Week:   . Minutes of Exercise per Session:   Stress:   . Feeling of Stress :   Social Connections:   . Frequency of Communication with Friends and Family:   . Frequency of Social Gatherings with Friends and Family:   . Attends Religious Services:   . Active Member of Clubs or Organizations:   . Attends Banker Meetings:   Marland Kitchen Marital Status:     Subjective: Review of Systems  Constitutional: Negative for chills, fever, malaise/fatigue and weight loss.  HENT: Negative for congestion and sore throat.   Respiratory: Negative for cough and shortness of breath.   Cardiovascular: Negative for chest pain and palpitations.  Gastrointestinal: Negative for abdominal pain, blood in stool, diarrhea,  melena, nausea and vomiting.  Musculoskeletal: Negative for joint pain and myalgias.  Skin: Negative for rash.  Neurological: Negative for dizziness and weakness.  Endo/Heme/Allergies: Does not bruise/bleed easily.  Psychiatric/Behavioral: Negative for depression. The patient is not nervous/anxious.   All other systems reviewed and are negative.    Objective: BP (!) 154/82   Pulse 80   Temp (!) 97.3 F (36.3 C)   Ht 5\' 6"  (1.676 m)   Wt 165 lb 3.2 oz (74.9 kg)   BMI 26.66 kg/m  Physical Exam Vitals and nursing note reviewed.  Constitutional:      General: He is not in acute distress.    Appearance: Normal appearance. He is normal weight. He is not ill-appearing, toxic-appearing or diaphoretic.  HENT:     Head: Normocephalic and atraumatic.     Nose: No congestion or rhinorrhea.  Eyes:     General: No scleral icterus. Cardiovascular:     Rate and Rhythm: Normal rate and regular rhythm.     Heart sounds: Normal heart sounds.  Pulmonary:     Effort: Pulmonary effort is normal.     Breath sounds: Normal breath sounds.  Abdominal:     General: Bowel sounds are normal. There is no distension.     Palpations: Abdomen is soft. There is no hepatomegaly, splenomegaly or mass.     Tenderness: There is no abdominal tenderness. There is no guarding or rebound.     Hernia: No hernia is present.  Musculoskeletal:     Cervical back: Neck supple.  Skin:    General: Skin is warm and dry.     Coloration: Skin is not jaundiced.     Findings: No bruising or rash.  Neurological:     General: No focal deficit present.     Mental Status: He is alert and oriented to person, place, and time. Mental status is at baseline.  Psychiatric:        Mood and Affect: Mood normal.        Behavior: Behavior normal.        Thought Content: Thought content normal.       03/05/2020 8:34 AM   Disclaimer: This note was dictated with voice recognition software. Similar sounding words  can  inadvertently be transcribed and may not be corrected upon review.

## 2020-03-05 NOTE — Patient Instructions (Signed)
Your health issues we discussed today were:   GERD (reflux/heartburn) with dysphagia (swallowing difficulties): 1. As we discussed, continue to chew your food well, eat softer foods, pressure pills as needed/if they are able to be crushed 2. I have printed a prescription for Protonix 1 month with 5 refills 3. I have printed a coupon so that your Protonix will only cost $6.84 a month 4. Take Protonix once a day, every day 5. Call us if you have any worsening or severe symptoms  Overall I recommend:  1. Continue your other current medications 2. Return for follow-up in 6 months 3. Call us if you have any questions or concerns   ---------------------------------------------------------------  I am glad you have gotten your COVID-19 vaccination!  Even though you are fully vaccinated you should continue to follow CDC and state/local guidelines.  ---------------------------------------------------------------   At [facility] we value your feedback. You may receive a survey about your visit today. Please share your experience as we strive to create trusting relationships with our patients to provide genuine, compassionate, quality care.  We appreciate your understanding and patience as we review any laboratory studies, imaging, and other diagnostic tests that are ordered as we care for you. Our office policy is 5 business days for review of these results, and any emergent or urgent results are addressed in a timely manner for your best interest. If you do not hear from our office in 1 week, please contact us.   We also encourage the use of MyChart, which contains your medical information for your review as well. If you are not enrolled in this feature, an access code is on this after visit summary for your convenience. Thank you for allowing [place] to be involved in your care.  It was great to see you today!  I hope you have a great Summer!!

## 2020-03-05 NOTE — Assessment & Plan Note (Signed)
Dysphagia currently well managed.  He was crushing his pills but is now able to take them whole.  We reviewed swallowing and chewing precautions which he seems to be following currently.  He discussed how he slow cooks as needed over several hours to make it more tender.  He is keeping adequate fluids on hand.  Call for any worsening or severe symptoms otherwise follow-up in 6 months.

## 2020-03-28 ENCOUNTER — Other Ambulatory Visit: Payer: Self-pay | Admitting: Cardiology

## 2020-03-28 DIAGNOSIS — I5021 Acute systolic (congestive) heart failure: Secondary | ICD-10-CM

## 2020-03-28 DIAGNOSIS — E785 Hyperlipidemia, unspecified: Secondary | ICD-10-CM

## 2020-03-28 DIAGNOSIS — I214 Non-ST elevation (NSTEMI) myocardial infarction: Secondary | ICD-10-CM

## 2020-04-20 ENCOUNTER — Other Ambulatory Visit: Payer: Self-pay | Admitting: Cardiology

## 2020-04-20 DIAGNOSIS — I2510 Atherosclerotic heart disease of native coronary artery without angina pectoris: Secondary | ICD-10-CM

## 2020-04-20 DIAGNOSIS — E782 Mixed hyperlipidemia: Secondary | ICD-10-CM

## 2020-05-03 ENCOUNTER — Other Ambulatory Visit: Payer: Self-pay | Admitting: Cardiology

## 2020-05-03 DIAGNOSIS — I2510 Atherosclerotic heart disease of native coronary artery without angina pectoris: Secondary | ICD-10-CM

## 2020-05-03 DIAGNOSIS — E782 Mixed hyperlipidemia: Secondary | ICD-10-CM

## 2020-09-02 ENCOUNTER — Ambulatory Visit (INDEPENDENT_AMBULATORY_CARE_PROVIDER_SITE_OTHER): Payer: Self-pay | Admitting: Nurse Practitioner

## 2020-09-02 ENCOUNTER — Encounter: Payer: Self-pay | Admitting: Nurse Practitioner

## 2020-09-02 ENCOUNTER — Other Ambulatory Visit: Payer: Self-pay

## 2020-09-02 VITALS — BP 114/71 | HR 65 | Temp 97.0°F | Ht 66.0 in | Wt 183.0 lb

## 2020-09-02 DIAGNOSIS — I255 Ischemic cardiomyopathy: Secondary | ICD-10-CM

## 2020-09-02 DIAGNOSIS — K219 Gastro-esophageal reflux disease without esophagitis: Secondary | ICD-10-CM

## 2020-09-02 DIAGNOSIS — R1310 Dysphagia, unspecified: Secondary | ICD-10-CM

## 2020-09-02 DIAGNOSIS — I5042 Chronic combined systolic (congestive) and diastolic (congestive) heart failure: Secondary | ICD-10-CM

## 2020-09-02 DIAGNOSIS — L603 Nail dystrophy: Secondary | ICD-10-CM

## 2020-09-02 NOTE — Progress Notes (Signed)
Referring Provider: Ponciano Ort [facility] Primary Care Physician:  [facility] Primary GI:  Dr. Marletta Lor  Chief Complaint  Patient presents with  . Dysphagia    Pills still getting stuck in throat    HPI:   Cameron Hoover is a 65 y.o. male who presents for follow-up. The patient was last seen in our office 03/05/2020 for dysphagia and GERD. History of pyloric ulcer. Last colonoscopy in [place] due in 2020 for repeat. GERD generally well managed on PPI. 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 Dysphagia most likely due to uncontrolled GERD or esophageal motility disorder given sporadic use of PPIs because he is uninsured.   At his last visit still with intermittent symptoms, sporadic medication use. Good Rx coupon printed for $6.84 a month for Protonix which he states he would be able to afford. Overall dysphagia doing well, crush his pills. No other overt GI complaints. Recommended reinforced chewing and swallowing precautions, refill Protonix with good Rx coupon, follow-up in 6 months.  Today he states he doing okay overall. He has noted some changes in his nails (appear to be spooning or koilonychia). He does have a history of anemia and heart disease (MI in 2016; last echo 2019 EF 35-40%). He hasn't seen a cardiologist in almost 2 years. He notes he has quit smoking at New Years, no tobacco in over a month. He was congratulated on this. He is getting medications from the health department. He was changed to omeprazole (likely from the health department due to costs of free medication). He is on 40 mg once daily. He still has daily GERD on this. Having pill dysphagia with essentially every pill he takes. Doesn't drink fluids before taking pills but drinks 16 oz with pills. Denies solid food dysphagia (following soft food diet). Denies abdominal pain, N/V, hematochezia, melena, fever, chills, unintentional weight loss.  Denies URI or flu-like symptoms. Denies loss of sense of taste or smell. The patient has received COVID-19 vaccination(s). Denies chest pain, dyspnea, dizziness, lightheadedness, syncope, near syncope. Denies any other upper or lower GI symptoms.  Past Medical History:  Diagnosis Date  . Acute blood loss anemia 03/2016  . Back pain   . Bradycardia    a. not on BB due to this.  . Chronic combined systolic and diastolic CHF (congestive heart failure) (HCC)   . Coronary artery disease    a. previously nonobstructive. b. then severe dyspnea/acute CHF 05/2015 s/p DES to mLAD.  Marland Kitchen Duodenitis 03/2016  . Gastritis 03/2016  . GERD (gastroesophageal reflux disease)   . GI bleed    a. 03/2016 with ABL anemia - (gastritis/duodenitis by EGD).  . Hyperlipidemia   . Myocardial infarction [facility]) 05/2015    Past Surgical History:  Procedure Laterality Date  . CARDIAC CATHETERIZATION  2007  . CARDIAC CATHETERIZATION N/A 05/28/2015   Procedure: Left Heart Cath and Cors/Grafts Angiography;  Surgeon: Lennette Bihari, MD;  Location: [facility] INVASIVE CV LAB;  Service: Cardiovascular;  Laterality: N/A;  . CARDIAC CATHETERIZATION N/A 05/28/2015   Procedure: Coronary Stent Intervention;  Surgeon: Lennette Bihari, MD;  Location: [facility] INVASIVE CV LAB;  Service: Cardiovascular;  Laterality: N/A;  . ESOPHAGOGASTRODUODENOSCOPY N/A 03/21/2016   Procedure: ESOPHAGOGASTRODUODENOSCOPY (EGD);  Surgeon: West Bali, MD;  [facility];  Service: Endoscopy;  Laterality: N/A;  . ESOPHAGOGASTRODUODENOSCOPY N/A 06/20/2016   Procedure: ESOPHAGOGASTRODUODENOSCOPY (EGD);  Surgeon: West Bali, MD;  [facility];  Service: Endoscopy;  Laterality: N/A;  1:15 pm  . ESOPHAGOGASTRODUODENOSCOPY N/A 08/09/2019   Procedure: ESOPHAGOGASTRODUODENOSCOPY (EGD);  Surgeon: West Bali, MD;  [facility];  Service: Endoscopy;  Laterality: N/A;  2:45pm  . FACIAL RECONSTRUCTION SURGERY Left 2004   facial injury that  required surgical repair  . SAVORY DILATION N/A 08/09/2019   Procedure: SAVORY DILATION;  Surgeon: West Bali, MD;  [facility];  Service: Endoscopy;  Laterality: N/A;    Current Outpatient Medications  Medication Sig Dispense Refill  . albuterol (VENTOLIN HFA) 108 (90 Base) MCG/ACT inhaler Inhale 2 puffs into the lungs 2 (two) times daily as needed.    Marland Kitchen aspirin EC 81 MG tablet Take 1 tablet (81 mg total) by mouth daily. 90 tablet 3  . atorvastatin (LIPITOR) 80 MG tablet Take 1 tablet (80 mg total) by mouth daily. 3rd request. Please schedule office visit before any future refills. (Patient taking differently: Take 40 mg by mouth daily.) 15 tablet 0  . cetirizine (ZYRTEC) 10 MG tablet Take 10 mg by mouth daily.    . cholecalciferol (VITAMIN D) 1000 units tablet Take 5,000 Units by mouth daily.    . cyclobenzaprine (FLEXERIL) 10 MG tablet Take 10 mg by mouth at bedtime.    . metoprolol succinate (TOPROL-XL) 25 MG 24 hr tablet Take 1 tablet (25 mg total) by mouth daily. (Patient taking differently: Take 25 mg by mouth in the morning and at bedtime.) 90 tablet 3  . olmesartan (BENICAR) 20 MG tablet Take 10 mg by mouth daily.    Marland Kitchen omeprazole (PRILOSEC) 20 MG capsule Take 40 mg by mouth daily.    . fluticasone (FLONASE) 50 MCG/ACT nasal spray Place 1 spray into both nostrils daily as needed for allergies.  (Patient not taking: Reported on 09/02/2020)    . lisinopril (ZESTRIL) 5 MG tablet Take 1 tablet (5 mg total) by mouth daily. Please make overdue appt with Dr. Delton See before anymore refills. 2nd attempt (Patient not taking: Reported on 09/02/2020) 15 tablet 0  . loratadine (CLARITIN) 10 MG tablet Take 10 mg by mouth daily. (Patient not taking: Reported on 09/02/2020)    . pantoprazole (PROTONIX) 40 MG tablet Take 1 tablet (40 mg total) by mouth daily. (Patient not taking: Reported on 09/02/2020) 30 tablet 5   No current facility-administered medications for this visit.    Allergies as of  09/02/2020  . (No Known Allergies)    Family History  Problem Relation Age of Onset  . Cancer Father   . Emphysema Father   . Heart attack Brother   . Heart attack Brother   . Cancer Mother   . Diabetes Mother   . Heart attack Cousin 40  . Colon cancer Neg Hx   . Gastric cancer Neg Hx   . Esophageal cancer Neg Hx     Social History   Socioeconomic History  . Marital status: Married    Spouse name: Not on file  . Number of children: Not on file  . Years of education: Not on file  . Highest education level: Not on file  Occupational History  . Not on file  Tobacco Use  . Smoking status: Current Every Day Smoker    Packs/day: 1.00    Years: 46.00    Pack years: 46.00    Types: Cigarettes  . Smokeless tobacco: Never Used  Vaping Use  . Vaping Use: Never used  Substance and Sexual Activity  . Alcohol  use: Yes    Alcohol/week: 14.0 standard drinks    Types: 14 Cans of beer per week    Comment: drinks 2-3 beers a night  . Drug use: Yes    Types: Marijuana    Comment: currently not using any marijuana  . Sexual activity: Not on file  Other Topics Concern  . Not on file  Social History Narrative  . Not on file   Social Determinants of Health   Financial Resource Strain: Not on file  Food Insecurity: Not on file  Transportation Needs: Not on file  Physical Activity: Not on file  Stress: Not on file  Social Connections: Not on file    Subjective: Review of Systems  Constitutional: Negative for chills, fever, malaise/fatigue and weight loss.  HENT: Negative for congestion and sore throat.   Respiratory: Negative for cough and shortness of breath.   Cardiovascular: Negative for chest pain and palpitations.  Gastrointestinal: Positive for heartburn. Negative for abdominal pain, blood in stool, diarrhea, melena, nausea and vomiting.       Esophageal burning  Musculoskeletal: Negative for joint pain and myalgias.  Skin: Negative for rash.  Neurological: Negative  for dizziness and weakness.  Endo/Heme/Allergies: Does not bruise/bleed easily.  Psychiatric/Behavioral: Negative for depression. The patient is not nervous/anxious.   All other systems reviewed and are negative.    Objective: BP 114/71   Pulse 65   Temp (!) 97 F (36.1 C)   Ht 5\' 6"  (1.676 m)   Wt 183 lb (83 kg)   BMI 29.54 kg/m  Physical Exam Vitals and nursing note reviewed.  Constitutional:      General: He is not in acute distress.    Appearance: Normal appearance. He is normal weight. He is not ill-appearing, toxic-appearing or diaphoretic.  HENT:     Head: Normocephalic and atraumatic.     Nose: No congestion or rhinorrhea.  Eyes:     General: No scleral icterus. Cardiovascular:     Rate and Rhythm: Normal rate and regular rhythm.     Heart sounds: Normal heart sounds.  Pulmonary:     Effort: Pulmonary effort is normal.     Breath sounds: Normal breath sounds.  Abdominal:     General: Bowel sounds are normal. There is no distension.     Palpations: Abdomen is soft. There is no hepatomegaly, splenomegaly or mass.     Tenderness: There is no abdominal tenderness. There is no guarding or rebound.     Hernia: No hernia is present.  Musculoskeletal:     Cervical back: Neck supple.  Skin:    General: Skin is warm and dry.     Coloration: Skin is not jaundiced.     Findings: No bruising or rash.  Neurological:     General: No focal deficit present.     Mental Status: He is alert and oriented to person, place, and time. Mental status is at baseline.  Psychiatric:        Mood and Affect: Mood normal.        Behavior: Behavior normal.        Thought Content: Thought content normal.      Assessment:  Very pleasant 65 year old male presents for follow-up on GERD and dysphagia.  Clinically he is doing stable.  No red flag/warning signs or symptoms at this time.  GERD: He is still having persistent GERD symptoms.  He was on Protonix 40 mg daily previously and a good  Rx coupon was given to  him.  However, he is establish care with the health department and they are providing his medications.  He subsequently switched him to omeprazole 40 mg daily.  Because of his persistent symptoms I recommend he increase this to twice daily for now.  Pill dysphagia: He does crush some of his pills, some pills are not able to be crushed.  When he takes his pills he generally has pill dysphagia.  This could be due to dry mucous membranes as he does not drink any fluids prior to taking his pills.  However, there is possibility of esophageal dysmotility as well.  It is reassuring that his food is not getting stuck as he tries to eat soft foods due to poor dentition with the help prevent solid food dysphagia.  At this point because he had had an EGD with dilation about 1 year ago I will proceed with a BPE to further evaluate.  Spoon nails (koilonychia): He notes recent splitting of his nails, as described in HPI.  He does have a history of anemia.  He has had an MI in 2016 and his EF was mildly decreased in 2019 documented as 35 to 40% on echocardiogram.  He has not seen a cardiologist in a couple years.  I recommended he follow-up with cardiology due to spooning of his nails which could be due to worsening heart disease.  To help with his medical costs being that he is uninsured I recommend he apply for con financial assistance.   Plan: 1. Schedule a follow-up appointment with cardiology ([facility]) 2. Plan for home financial assistance 3. Increase omeprazole to 40 mg twice daily 4. Erin.  Cheree Ditto 5. Follow-up in 2 months     Thank you for allowing [place] to participate in the care of Cameron I Lolita Rieger, DNP, AGNP-C Adult & Gerontological Nurse Practitioner [facility]   09/02/2020 9:11 AM   Disclaimer: This note was dictated with voice recognition software. Similar sounding words can inadvertently be transcribed and may not  be corrected upon review.

## 2020-09-02 NOTE — Patient Instructions (Signed)
Your health issues we discussed today were:   GERD (reflux/heartburn) with pill dysphagia (difficulty swallowing pills ): 1. Increase your omeprazole (Prilosec) to 40 mg twice a day.  Take this pursing in the morning on empty stomach and 30 minutes for your last meal the day 2. We will schedule swallowing study for you to evaluate for issues causing your difficulty swallowing pills 3. Continue to crush the medications that you are able to crush 4. Call us if he has any worsening or severe symptoms 5. Is something gets stuck in your swallowing tube and will not go/will not go backward for more than 2 hours and proceed to the emergency room  Koilonychia ("spooning nails"): 1. There are several things that can be causing your nails to look like aspirin in the middle.  One of these is heart disease. 2. I recommend you follow-up with your cardiologist so they can evaluate the status of your heart disease 3. To help with your office visit costs, apply for current financial assistance.  We can provide you with the paperwork 4. You should also follow-up with your primary care at the health department so they can further evaluate for other possible causes  Overall I recommend:  1. Continue other current medications 2. Return for follow-up in 2 months 3. Call us for any questions or concerns   ---------------------------------------------------------------  I am glad you have gotten your COVID-19 vaccination!  Even though you are fully vaccinated you should continue to follow CDC and state/local guidelines.  ---------------------------------------------------------------   At [facility] we value your feedback. You may receive a survey about your visit today. Please share your experience as we strive to create trusting relationships with our patients to provide genuine, compassionate, quality care.  We appreciate your understanding and patience as we review any laboratory studies,  imaging, and other diagnostic tests that are ordered as we care for you. Our office policy is 5 business days for review of these results, and any emergent or urgent results are addressed in a timely manner for your best interest. If you do not hear from our office in 1 week, please contact us.   We also encourage the use of MyChart, which contains your medical information for your review as well. If you are not enrolled in this feature, an access code is on this after visit summary for your convenience. Thank you for allowing [place] to be involved in your care.  It was great to see you today!  I hope you have a safe and warm winter!!

## 2020-09-03 ENCOUNTER — Telehealth (INDEPENDENT_AMBULATORY_CARE_PROVIDER_SITE_OTHER): Payer: Self-pay | Admitting: Cardiology

## 2020-09-03 ENCOUNTER — Telehealth: Payer: Self-pay

## 2020-09-03 VITALS — BP 114/71 | HR 65 | Ht 66.0 in | Wt 183.0 lb

## 2020-09-03 DIAGNOSIS — E785 Hyperlipidemia, unspecified: Secondary | ICD-10-CM

## 2020-09-03 DIAGNOSIS — I5042 Chronic combined systolic (congestive) and diastolic (congestive) heart failure: Secondary | ICD-10-CM

## 2020-09-03 DIAGNOSIS — I10 Essential (primary) hypertension: Secondary | ICD-10-CM

## 2020-09-03 DIAGNOSIS — Z716 Tobacco abuse counseling: Secondary | ICD-10-CM

## 2020-09-03 DIAGNOSIS — I2510 Atherosclerotic heart disease of native coronary artery without angina pectoris: Secondary | ICD-10-CM

## 2020-09-03 DIAGNOSIS — I255 Ischemic cardiomyopathy: Secondary | ICD-10-CM

## 2020-09-03 NOTE — Addendum Note (Signed)
Addended by: Michaelle Copas on: 09/03/2020 10:51 AM   Modules accepted: Orders
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
## 2020-09-03 NOTE — Patient Instructions (Signed)
Medication Instructions:  Your physician recommends that you continue on your current medications as directed. Please refer to the Current Medication list given to you today.   *If you need a refill on your cardiac medications before your next appointment, please call your pharmacy*   Lab Work: CMET, LIPIDS, CBC: done same day as echo If you have labs (blood work) drawn today and your tests are completely normal, you will receive your results only by: Marland Kitchen MyChart Message (if you have MyChart) OR . A paper copy in the mail If you have any lab test that is abnormal or we need to change your treatment, we will call you to review the results.   Testing/Procedures: Your physician has requested that you have an echocardiogram. Echocardiography is a painless test that uses sound waves to create images of your heart. It provides your doctor with information about the size and shape of your heart and how well your heart's chambers and valves are working. This procedure takes approximately one hour. There are no restrictions for this procedure.  Your physician recommends you have an EKG.  Follow-Up: At [facility], you and your health needs are our priority.  As part of our continuing mission to provide you with exceptional heart care, we have created designated Provider Care Teams.  These Care Teams include your primary Cardiologist (physician) and Advanced Practice Providers (APPs -  Physician Assistants and Nurse Practitioners) who all work together to provide you with the care you need, when you need it.  We recommend signing up for the patient portal called "MyChart".  Sign up information is provided on this After Visit Summary.  MyChart is used to connect with patients for Virtual Visits (Telemedicine).  Patients are able to view lab/test results, encounter notes, upcoming appointments, etc.  Non-urgent messages can be sent to your provider as well.   To learn more about what you can do with  MyChart, go to [email or web address].    Your next appointment:   6 month(s)  The format for your next appointment:   In Person  Provider:      Georgie Chard, NP    Other Instructions  Echocardiogram An echocardiogram is a test that uses sound waves (ultrasound) to produce images of the heart. Images from an echocardiogram can provide important information about:  Heart size and shape.  The size and thickness and movement of your heart's walls.  Heart muscle function and strength.  Heart valve function or if you have stenosis. Stenosis is when the heart valves are too narrow.  If blood is flowing backward through the heart valves (regurgitation).  A tumor or infectious growth around the heart valves.  Areas of heart muscle that are not working well because of poor blood flow or injury from a heart attack.  Aneurysm detection. An aneurysm is a weak or damaged part of an artery wall. The wall bulges out from the normal force of blood pumping through the body. Tell a health care provider about:  Any allergies you have.  All medicines you are taking, including vitamins, herbs, eye drops, creams, and over-the-counter medicines.  Any blood disorders you have.  Any surgeries you have had.  Any medical conditions you have.  Whether you are pregnant or may be pregnant. What are the risks? Generally, this is a safe test. However, problems may occur, including an allergic reaction to dye (contrast) that may be used during the test. What happens before the test? No specific preparation is needed. You  may eat and drink normally. What happens during the test?  You will take off your clothes from the waist up and put on a hospital gown.  Electrodes or electrocardiogram (ECG)patches may be placed on your chest. The electrodes or patches are then connected to a device that monitors your heart rate and rhythm.  You will lie down on a table for an ultrasound exam. A gel will  be applied to your chest to help sound waves pass through your skin.  A handheld device, called a transducer, will be pressed against your chest and moved over your heart. The transducer produces sound waves that travel to your heart and bounce back (or "echo" back) to the transducer. These sound waves will be captured in real-time and changed into images of your heart that can be viewed on a video monitor. The images will be recorded on a computer and reviewed by your health care provider.  You may be asked to change positions or hold your breath for a short time. This makes it easier to get different views or better views of your heart.  In some cases, you may receive contrast through an IV in one of your veins. This can improve the quality of the pictures from your heart. The procedure may vary among health care providers and hospitals.   What can I expect after the test? You may return to your normal, everyday life, including diet, activities, and medicines, unless your health care provider tells you not to do that. Follow these instructions at home:  It is up to you to get the results of your test. Ask your health care provider, or the department that is doing the test, when your results will be ready.  Keep all follow-up visits. This is important. Summary  An echocardiogram is a test that uses sound waves (ultrasound) to produce images of the heart.  Images from an echocardiogram can provide important information about the size and shape of your heart, heart muscle function, heart valve function, and other possible heart problems.  You do not need to do anything to prepare before this test. You may eat and drink normally.  After the echocardiogram is completed, you may return to your normal, everyday life, unless your health care provider tells you not to do that. This information is not intended to replace advice given to you by your health care provider. Make sure you discuss any  questions you have with your health care provider. Document Revised: 03/10/2020 Document Reviewed: 03/10/2020 Elsevier Patient Education  2021 [employer].

## 2020-09-07 ENCOUNTER — Ambulatory Visit (HOSPITAL_COMMUNITY)
Admission: RE | Admit: 2020-09-07 | Discharge: 2020-09-07 | Disposition: A | Discharge status: Home / Self Care | Payer: Medicaid Other | Source: Ambulatory Visit | Attending: Nurse Practitioner | Admitting: Nurse Practitioner

## 2020-09-07 ENCOUNTER — Other Ambulatory Visit: Payer: Self-pay

## 2020-09-07 DIAGNOSIS — I255 Ischemic cardiomyopathy: Secondary | ICD-10-CM | POA: Insufficient documentation

## 2020-09-07 DIAGNOSIS — L603 Nail dystrophy: Secondary | ICD-10-CM | POA: Insufficient documentation

## 2020-09-07 DIAGNOSIS — R1310 Dysphagia, unspecified: Secondary | ICD-10-CM | POA: Insufficient documentation

## 2020-09-07 DIAGNOSIS — I5042 Chronic combined systolic (congestive) and diastolic (congestive) heart failure: Secondary | ICD-10-CM | POA: Insufficient documentation

## 2020-09-07 DIAGNOSIS — K219 Gastro-esophageal reflux disease without esophagitis: Secondary | ICD-10-CM | POA: Insufficient documentation

## 2020-09-15 ENCOUNTER — Other Ambulatory Visit: Payer: Self-pay

## 2020-09-15 DIAGNOSIS — R1310 Dysphagia, unspecified: Secondary | ICD-10-CM

## 2020-09-21 ENCOUNTER — Ambulatory Visit (HOSPITAL_COMMUNITY): Payer: Medicaid Other | Attending: Nurse Practitioner | Admitting: Speech Pathology

## 2020-09-21 ENCOUNTER — Encounter (HOSPITAL_COMMUNITY): Payer: Self-pay | Admitting: Speech Pathology

## 2020-09-21 ENCOUNTER — Other Ambulatory Visit: Payer: Self-pay

## 2020-09-21 DIAGNOSIS — R1311 Dysphagia, oral phase: Secondary | ICD-10-CM | POA: Insufficient documentation
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
## 2020-09-28 ENCOUNTER — Other Ambulatory Visit (HOSPITAL_COMMUNITY): Payer: Self-pay

## 2020-09-28 ENCOUNTER — Other Ambulatory Visit: Payer: Self-pay

## 2020-09-28 ENCOUNTER — Telehealth (HOSPITAL_COMMUNITY): Payer: Self-pay | Admitting: Cardiology

## 2020-09-28 NOTE — Telephone Encounter (Signed)
Patient spoke with Constance Holster RN and state he had shingles and was not able to keep appointment for echo on 09/28/20.  Patient got marked no show BUT patient was not a NO SHOW per message from RN. I called patient to reschedule echocardiogram and patient does not wish to reschedule at this time due to him having the shingles and he will callus back when they are healed.  Order will be removed from the WQ. We can reinstate the order when patient calls back to reschedule.

## 2020-10-27 ENCOUNTER — Other Ambulatory Visit: Payer: Self-pay

## 2020-10-27 ENCOUNTER — Other Ambulatory Visit (HOSPITAL_COMMUNITY): Payer: Medicaid Other

## 2020-10-27 ENCOUNTER — Encounter (HOSPITAL_COMMUNITY): Payer: Self-pay

## 2020-10-27 DIAGNOSIS — I2510 Atherosclerotic heart disease of native coronary artery without angina pectoris: Secondary | ICD-10-CM

## 2020-10-27 DIAGNOSIS — I255 Ischemic cardiomyopathy: Secondary | ICD-10-CM

## 2020-10-27 DIAGNOSIS — I10 Essential (primary) hypertension: Secondary | ICD-10-CM

## 2020-10-27 DIAGNOSIS — I5042 Chronic combined systolic (congestive) and diastolic (congestive) heart failure: Secondary | ICD-10-CM

## 2020-10-27 DIAGNOSIS — E785 Hyperlipidemia, unspecified: Secondary | ICD-10-CM

## 2020-10-27 LAB — COMPREHENSIVE METABOLIC PANEL
ALT: 43 IU/L (ref 0–44)
AST: 33 IU/L (ref 0–40)
Albumin/Globulin Ratio: 1.1 — ABNORMAL LOW (ref 1.2–2.2)
Albumin: 4.1 g/dL (ref 3.8–4.8)
Alkaline Phosphatase: 126 IU/L — ABNORMAL HIGH (ref 44–121)
BUN/Creatinine Ratio: 24 (ref 10–24)
BUN: 27 mg/dL (ref 8–27)
Bilirubin Total: 0.3 mg/dL (ref 0.0–1.2)
CO2: 22 mmol/L (ref 20–29)
Calcium: 9.7 mg/dL (ref 8.6–10.2)
Chloride: 100 mmol/L (ref 96–106)
Creatinine, Ser: 1.11 mg/dL (ref 0.76–1.27)
Globulin, Total: 3.6 g/dL (ref 1.5–4.5)
Glucose: 102 mg/dL — ABNORMAL HIGH (ref 65–99)
Potassium: 4.6 mmol/L (ref 3.5–5.2)
Sodium: 135 mmol/L (ref 134–144)
Total Protein: 7.7 g/dL (ref 6.0–8.5)
eGFR: 75 mL/min/{1.73_m2} (ref >59)

## 2020-10-27 LAB — CBC
Hematocrit: 43.9 % (ref 37.5–51.0)
Hemoglobin: 15.2 g/dL (ref 13.0–17.7)
MCH: 31 pg (ref 26.6–33.0)
MCHC: 34.6 g/dL (ref 31.5–35.7)
MCV: 89 fL (ref 79–97)
Platelets: 292 10*3/uL (ref 150–450)
RBC: 4.91 x10E6/uL (ref 4.14–5.80)
RDW: 13.3 % (ref 11.6–15.4)
WBC: 6.2 10*3/uL (ref 3.4–10.8)

## 2020-10-27 LAB — LIPID PANEL
Chol/HDL Ratio: 3.6 ratio (ref 0.0–5.0)
Cholesterol, Total: 155 mg/dL (ref 100–199)
HDL: 43 mg/dL (ref >39)
LDL Chol Calc (NIH): 91 mg/dL (ref 0–99)
Triglycerides: 114 mg/dL (ref 0–149)
VLDL Cholesterol Cal: 21 mg/dL (ref 5–40)

## 2020-10-27 NOTE — Progress Notes (Signed)
Verified appointment "no show" status with Artelia Laroche at 08:30.

## 2020-11-03 ENCOUNTER — Other Ambulatory Visit: Payer: Self-pay

## 2020-11-03 ENCOUNTER — Ambulatory Visit: Payer: Self-pay | Admitting: Nurse Practitioner

## 2020-11-03 ENCOUNTER — Encounter: Payer: Self-pay | Admitting: Nurse Practitioner

## 2020-11-03 VITALS — BP 138/80 | HR 67 | Temp 96.8°F | Ht 66.0 in | Wt 184.4 lb

## 2020-11-03 DIAGNOSIS — K219 Gastro-esophageal reflux disease without esophagitis: Secondary | ICD-10-CM

## 2020-11-03 DIAGNOSIS — R1319 Other dysphagia: Secondary | ICD-10-CM

## 2020-11-03 DIAGNOSIS — I255 Ischemic cardiomyopathy: Secondary | ICD-10-CM

## 2020-11-03 MED ORDER — OMEPRAZOLE 40 MG PO CPDR: 40.0000 mg | DELAYED_RELEASE_CAPSULE | Freq: Two times a day (BID) | ORAL | 3 refills | Status: DC

## 2020-11-03 NOTE — Patient Instructions (Addendum)
Your health issues we discussed today were:   Previous heart problems: 1. I am glad you are able to see cardiology 2. Keep your appointment on 11/26/2020 for your echocardiogram 3. Cardiology will make further recommendations based on results of your labs and echo  GERD (reflux/heartburn): 1. I recommend that you increase your omeprazole to 40 mg twice a day 2. I sent a new prescription to the health department to ensure you have enough pills 3. Call us in 2 weeks and let us know if this is helping 4. Avoid trigger foods that make your symptoms worse (typically spicy food, acidic food, greasy foods) 5. Avoid eating within 3 hours of lying down to go to bed 6. I am providing printed education information below about reflux and foods to avoid 7. Call us for any worsening or severe symptoms  Overall I recommend:  1. Continue other current medications 2. Return for follow-up in 3 months 3. Call us for any questions or concerns   ---------------------------------------------------------------  I am glad you have gotten your COVID-19 vaccination!  Even though you are fully vaccinated you should continue to follow CDC and state/local guidelines.  ---------------------------------------------------------------   At [facility] we value your feedback. You may receive a survey about your visit today. Please share your experience as we strive to create trusting relationships with our patients to provide genuine, compassionate, quality care.  We appreciate your understanding and patience as we review any laboratory studies, imaging, and other diagnostic tests that are ordered as we care for you. Our office policy is 5 business days for review of these results, and any emergent or urgent results are addressed in a timely manner for your best interest. If you do not hear from our office in 1 week, please contact us.   We also encourage the use of MyChart, which contains your medical  information for your review as well. If you are not enrolled in this feature, an access code is on this after visit summary for your convenience. Thank you for allowing [place] to be involved in your care.  It was great to see you today!  I hope you have a great spring!!      Food Choices for Gastroesophageal Reflux Disease, Adult When you have gastroesophageal reflux disease (GERD), the foods you eat and your eating habits are very important. Choosing the right foods can help ease your discomfort. Think about working with a food expert (dietitian) to help you make good choices. What are tips for following this plan? Reading food labels  Look for foods that are low in saturated fat. Foods that may help with your symptoms include: ? Foods that have less than 5% of daily value (DV) of fat. ? Foods that have 0 grams of trans fat. Cooking  Do not fry your food.  Cook your food by baking, steaming, grilling, or broiling. These are all methods that do not need a lot of fat for cooking.  To add flavor, try to use herbs that are low in spice and acidity. Meal planning  Choose healthy foods that are low in fat, such as: ? Fruits and vegetables. ? Whole grains. ? Low-fat dairy products. ? Lean meats, fish, and poultry.  Eat small meals often instead of eating 3 large meals each day. Eat your meals slowly in a place where you are relaxed. Avoid bending over or lying down until 2-3 hours after eating.  Limit high-fat foods such as fatty meats or fried foods.  Limit  your intake of fatty foods, such as oils, butter, and shortening.  Avoid the following as told by your doctor: ? Foods that cause symptoms. These may be different for different people. Keep a food diary to keep track of foods that cause symptoms. ? Alcohol. ? Drinking a lot of liquid with meals. ? Eating meals during the 2-3 hours before bed.   Lifestyle  Stay at a healthy weight. Ask your doctor what weight is healthy for you.  If you need to lose weight, work with your doctor to do so safely.  Exercise for at least 30 minutes on 5 or more days each week, or as told by your doctor.  Wear loose-fitting clothes.  Do not smoke or use any products that contain nicotine or tobacco. If you need help quitting, ask your doctor.  Sleep with the head of your bed higher than your feet. Use a wedge under the mattress or blocks under the bed frame to raise the head of the bed.  Chew sugar-free gum after meals. What foods should eat? Eat a healthy, well-balanced diet of fruits, vegetables, whole grains, low-fat dairy products, lean meats, fish, and poultry. Each person is different. Foods that may cause symptoms in one person may not cause any symptoms in another person. Work with your doctor to find foods that are safe for you. The items listed above may not be a complete list of what you can eat and drink. Contact a food expert for more options.   What foods should I avoid? Limiting some of these foods may help in managing the symptoms of GERD. Everyone is different. Talk with a food expert or your doctor to help you find the exact foods to avoid, if any. Fruits Any fruits prepared with added fat. Any fruits that cause symptoms. For some people, this may include citrus fruits, such as oranges, grapefruit, pineapple, and lemons. Vegetables Deep-fried vegetables. [place] fries. Any vegetables prepared with added fat. Any vegetables that cause symptoms. For some people, this may include tomatoes and tomato products, chili peppers, onions and garlic, and horseradish. Grains Pastries or quick breads with added fat. Meats and other proteins High-fat meats, such as fatty beef or pork, hot dogs, ribs, ham, sausage, salami, and bacon. Fried meat or protein, including fried fish and fried chicken. Nuts and nut butters, in large amounts. Dairy Whole milk and chocolate milk. Sour cream. Cream. Ice cream. Cream cheese. Milkshakes. Fats  and oils Butter. Margarine. Shortening. Ghee. Beverages Coffee and tea, with or without caffeine. Carbonated beverages. Sodas. Energy drinks. Fruit juice made with acidic fruits, such as orange or grapefruit. Tomato juice. Alcoholic drinks. Sweets and desserts Chocolate and cocoa. Donuts. Seasonings and condiments Pepper. Peppermint and spearmint. Added salt. Any condiments, herbs, or seasonings that cause symptoms. For some people, this may include curry, hot sauce, or vinegar-based salad dressings. The items listed above may not be a complete list of what you should not eat and drink. Contact a food expert for more options. Questions to ask your doctor Diet and lifestyle changes are often the first steps that are taken to manage symptoms of GERD. If diet and lifestyle changes do not help, talk with your doctor about taking medicines. Where to find more information  International Foundation for Gastrointestinal Disorders: aboutgerd.org Summary  When you have GERD, food and lifestyle choices are very important in easing your symptoms.  Eat small meals often instead of 3 large meals a day. Eat your meals slowly and in a  place where you are relaxed.  Avoid bending over or lying down until 2-3 hours after eating.  Limit high-fat foods such as fatty meats or fried foods. This information is not intended to replace advice given to you by your health care provider. Make sure you discuss any questions you have with your health care provider. Document Revised: 01/27/2020 Document Reviewed: 01/27/2020 Elsevier Patient Education  2021 [employer].
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
## 2020-11-26 ENCOUNTER — Ambulatory Visit (HOSPITAL_COMMUNITY): Payer: Self-pay | Attending: Cardiology

## 2020-11-26 ENCOUNTER — Other Ambulatory Visit: Payer: Self-pay

## 2020-11-26 DIAGNOSIS — I10 Essential (primary) hypertension: Secondary | ICD-10-CM | POA: Insufficient documentation

## 2020-11-26 DIAGNOSIS — I5042 Chronic combined systolic (congestive) and diastolic (congestive) heart failure: Secondary | ICD-10-CM | POA: Insufficient documentation

## 2020-11-26 DIAGNOSIS — I2510 Atherosclerotic heart disease of native coronary artery without angina pectoris: Secondary | ICD-10-CM | POA: Insufficient documentation

## 2020-11-26 DIAGNOSIS — I255 Ischemic cardiomyopathy: Secondary | ICD-10-CM | POA: Insufficient documentation

## 2020-11-26 LAB — ECHOCARDIOGRAM COMPLETE
AR max vel: 1.67 cm2
AV Area VTI: 1.55 cm2
AV Area mean vel: 1.63 cm2
AV Mean grad: 11 mmHg
AV Peak grad: 19.8 mmHg
Ao pk vel: 2.23 m/s
Area-P 1/2: 4.6 cm2
P 1/2 time: 436 msec
S' Lateral: 4.1 cm

## 2021-02-03 ENCOUNTER — Other Ambulatory Visit: Payer: Self-pay

## 2021-02-03 ENCOUNTER — Encounter: Payer: Self-pay | Admitting: Gastroenterology

## 2021-02-03 ENCOUNTER — Ambulatory Visit (INDEPENDENT_AMBULATORY_CARE_PROVIDER_SITE_OTHER): Payer: Self-pay | Admitting: Gastroenterology

## 2021-02-03 ENCOUNTER — Ambulatory Visit: Payer: Medicaid Other | Admitting: Gastroenterology

## 2021-02-03 ENCOUNTER — Ambulatory Visit: Payer: Medicaid Other | Admitting: Nurse Practitioner

## 2021-02-03 VITALS — BP 116/68 | HR 72 | Temp 97.7°F | Ht 66.0 in | Wt 178.0 lb

## 2021-02-03 DIAGNOSIS — F1020 Alcohol dependence, uncomplicated: Secondary | ICD-10-CM

## 2021-02-03 DIAGNOSIS — K5900 Constipation, unspecified: Secondary | ICD-10-CM

## 2021-02-03 DIAGNOSIS — K219 Gastro-esophageal reflux disease without esophagitis: Secondary | ICD-10-CM

## 2021-02-03 NOTE — Progress Notes (Signed)
Primary Care Physician: Health, [facility]  Primary Gastroenterologist:  Hennie Duos. Marletta Lor, DO   Chief Complaint  Patient presents with   Gastroesophageal Reflux    HPI: Cameron Hoover is a 65 y.o. male here for follow-up.  Last seen April 2022.  History of GERD, dysphagia, prior peptic ulcer.  Last EGD January 2021 was normal, esophagus dilated due to history of esophageal dysphagia.  Taking omeprazole 40mg  BID. Having nocturnal symptoms about once per week.  Typically happens only after eats tomato-based foods and lays down shortly afterwards.  Denies any frequent daytime symptoms.  No dysphagia.  He states that recently he has had some burning in the chest when he walks distances or walks up hills.  He thinks this is reflux.  Saw cardiology in April.  Echocardiogram showed EF of 50 to 55% but progressive aortic insufficiency, moderate aortic regurgitation.  Patient states he follows up in a year.  I encouraged him to call them today to let them know he is having exertional symptoms that need to be evaluated.  Denies chest pain at this time.  Unfortunately he is smoking again.  He tells me he drinks two to three 12 ounce beers daily, specifically to help his constipation.  He has done this for years.  He does have a history of DUI, has a block on his license.  Does not feel like he has a drinking problem and no desires to quit.  Offered liver ultrasound to evaluate for chronic liver disease but he would like to next year when he has Medicare.  No melena or rectal bleeding.    Last colonoscopy by Dr. May in July 2017, tubular adenoma removed.  Advised 5-year follow-up.  Patient aware.  Concerned that he is uninsured.  Patient assistant program forms to be provided.  Current Outpatient Medications  Medication Sig Dispense Refill   albuterol (VENTOLIN HFA) 108 (90 Base) MCG/ACT inhaler Inhale 2 puffs into the lungs 2 (two) times daily as needed.     aspirin EC 81 MG  tablet Take 1 tablet (81 mg total) by mouth daily. 90 tablet 3   atorvastatin (LIPITOR) 80 MG tablet Take 1 tablet (80 mg total) by mouth daily. 3rd request. Please schedule office visit before any future refills. (Patient taking differently: Take 40 mg by mouth 2 (two) times daily.) 15 tablet 0   cetirizine (ZYRTEC) 10 MG tablet Take 10 mg by mouth daily.     cholecalciferol (VITAMIN D) 1000 units tablet Take 5,000 Units by mouth daily.     cyclobenzaprine (FLEXERIL) 10 MG tablet Take 10 mg by mouth at bedtime.     olmesartan (BENICAR) 20 MG tablet Take 5 mg by mouth 2 (two) times daily.     omeprazole (PRILOSEC) 40 MG capsule Take 1 capsule (40 mg total) by mouth in the morning and at bedtime. (Patient taking differently: Take 20 mg by mouth in the morning and at bedtime. Takes 2 tablets twice daily. (80 mg daily)) 60 capsule 3   metoprolol succinate (TOPROL-XL) 25 MG 24 hr tablet Take 1 tablet (25 mg total) by mouth daily. (Patient taking differently: Take 25 mg by mouth in the morning and at bedtime.) 90 tablet 3   No current facility-administered medications for this visit.    Allergies as of 02/03/2021   (No Known Allergies)    ROS:  General: Negative for anorexia, weight loss, fever, chills, fatigue, weakness. ENT: Negative for hoarseness, difficulty swallowing , nasal  congestion. CV: Negative for   palpitations, dyspnea on exertion, peripheral edema.  See HPI Respiratory: Negative for dyspnea at rest, dyspnea on exertion, cough, sputum, wheezing.  GI: See history of present illness. GU:  Negative for dysuria, hematuria, urinary incontinence, urinary frequency, nocturnal urination.  Endo: Negative for unusual weight change.    Physical Examination:   BP 116/68   Pulse 72   Temp 97.7 F (36.5 C) (Temporal)   Ht 5\' 6"  (1.676 m)   Wt 178 lb (80.7 kg)   BMI 28.73 kg/m   General: Well-nourished, well-developed in no acute distress.  Eyes: No icterus. Mouth: masked Lungs:  Clear to auscultation bilaterally.  Heart: Regular rate and rhythm, no murmurs rubs or gallops.  Abdomen: Bowel sounds are normal, nontender, nondistended, no hepatosplenomegaly or masses, no abdominal bruits or hernia , no rebound or guarding.   Extremities: No lower extremity edema. No clubbing or deformities. Neuro: Alert and oriented x 4   Skin: Warm and dry, no jaundice.   Psych: Alert and cooperative, normal mood and affect.  Labs:  Lab Results  Component Value Date   CREATININE 1.11 10/27/2020   BUN 27 10/27/2020   NA 135 10/27/2020   K 4.6 10/27/2020   CL 100 10/27/2020   CO2 22 10/27/2020   Lab Results  Component Value Date   ALT 43 10/27/2020   AST 33 10/27/2020   ALKPHOS 126 (H) 10/27/2020   BILITOT 0.3 10/27/2020   Lab Results  Component Value Date   WBC 6.2 10/27/2020   HGB 15.2 10/27/2020   HCT 43.9 10/27/2020   MCV 89 10/27/2020   PLT 292 10/27/2020     Imaging Studies: No results found.   Assessment: Plan 65 year old male with history of chronic GERD, alcohol dependency, constipation presenting for follow-up.  GERD: PPI was increased to twice daily at last visit.  He is a difficult historian.  He does state that he has nocturnal reflux about once per week but has been associated with eating late (tomatoes and spaghetti) and laying down shortly afterwards.  No significant reflux during the day.  He complains of chest discomfort/burning with exertion, walking long distances or walking uphill.  He states "my heart checked out fine".  Reviewed his echocardiogram which noted improved ejection fraction but worsening aortic insufficiency.  Precautions provided to patient but he does not have clear understanding today.  I encouraged him to call his cardiologist today and report his exertional chest discomfort.  Currently no chest pain in the office.  Patient voiced understanding and agree with plan.  Alcohol dependency: Patient reports that he has had a DUI.  States  he is a block on getting his license back.  He does not feel like he has an issue with alcohol.  No desires to quit.  Currently drinking two to three 12 ounce beers per day.  Offered ultrasound of the liver to evaluate for chronic liver disease.  Patient is not interested at this time.  He states he may consider next year once he has Medicare.  Patient states he drinks alcohol only because it helps him have a regular bowel movement.  Constipation: Patient reports he drinks daily alcohol in order to have a regular BM.  We discussed different constipation regimens but he is not interested at this time.   Plan: Try cutting back on your omeprazole to once daily if tolerated.  If recurrent reflux symptoms go back to twice daily. Reinforced antireflux measures.  Encouraged patient to call  his cardiologist today regarding his exertional chest discomfort. Encourage patient to cut back on alcohol consumption.  Offered ultrasound of liver to evaluate for chronic liver disease but he would like to postpone until he has Medicare at age 39.  Patient does not feel like he has a problem with alcohol and does not desire to quit. Patient reports he drinks alcohol in order to manage his constipation.  We have offered constipation regimen but at this time he is not interested. Patient aware he is due for surveillance colonoscopy at this time.  He would like to try to get patient assistance prior to having a colonoscopy.  Forms to be provided today.  We will give him a reminder call in 1 or 2 months in order to schedule. Return to the office in 1 year or call sooner if needed.

## 2021-02-03 NOTE — Patient Instructions (Addendum)
Consider decreasing omeprazole to once daily if tolerated.  If you have recurrent heartburn, abdominal pain, go back on twice daily. Make sure you wait at least 2 to 3 hours after eating before you lay down.  This is especially important if you are eating acidic foods such as tomatoes, spaghetti. I would encourage you to call your cardiologist and report chest discomfort that you are having with walking up hills or distances.  This is likely not related to your reflux and more of a heart issue. Would encourage you to cut back on alcohol consumption.  You are at increased risk for developing liver disease due to chronic daily alcohol use.  As discussed today, we can order an ultrasound of your liver whenever you are ready. If you decide you would like for [place] to treat constipation with medication, please let us know. We will see back in 1 year but please call sooner if you have any questions or concerns.

## 2021-04-21 ENCOUNTER — Telehealth: Payer: Self-pay | Admitting: Gastroenterology

## 2021-04-21 NOTE — Telephone Encounter (Signed)
Patient seen in 01/2021. Held off colonoscopy as we advised he see his cardiologist for exertional chest pain. Also he wanted to get patient assistance first.   Offer him a follow up ov to consider scheduling colonoscopy.

## 2021-04-22 NOTE — Telephone Encounter (Signed)
Phoned the pt and LMOVM for the pt to return call 

## 2021-04-26 NOTE — Telephone Encounter (Signed)
Phoned the pt , LMOVM to return call 

## 2021-04-27 NOTE — Telephone Encounter (Signed)
Letter mailed to pt to contact the office.

## 2021-05-04 ENCOUNTER — Other Ambulatory Visit: Payer: Self-pay

## 2021-05-04 ENCOUNTER — Encounter (HOSPITAL_COMMUNITY): Payer: Self-pay

## 2021-05-04 ENCOUNTER — Emergency Department (HOSPITAL_COMMUNITY)
Admission: EM | Admit: 2021-05-04 | Discharge: 2021-05-05 | Disposition: A | Discharge status: Home / Self Care | Payer: Medicaid Other | Attending: Emergency Medicine | Admitting: Emergency Medicine

## 2021-05-04 DIAGNOSIS — I2510 Atherosclerotic heart disease of native coronary artery without angina pectoris: Secondary | ICD-10-CM | POA: Insufficient documentation

## 2021-05-04 DIAGNOSIS — R1310 Dysphagia, unspecified: Secondary | ICD-10-CM | POA: Insufficient documentation

## 2021-05-04 DIAGNOSIS — R195 Other fecal abnormalities: Secondary | ICD-10-CM

## 2021-05-04 DIAGNOSIS — Z79899 Other long term (current) drug therapy: Secondary | ICD-10-CM | POA: Insufficient documentation

## 2021-05-04 DIAGNOSIS — K625 Hemorrhage of anus and rectum: Secondary | ICD-10-CM | POA: Insufficient documentation

## 2021-05-04 DIAGNOSIS — Z87891 Personal history of nicotine dependence: Secondary | ICD-10-CM | POA: Insufficient documentation

## 2021-05-04 DIAGNOSIS — I5042 Chronic combined systolic (congestive) and diastolic (congestive) heart failure: Secondary | ICD-10-CM | POA: Insufficient documentation

## 2021-05-04 DIAGNOSIS — I110 Hypertensive heart disease with heart failure: Secondary | ICD-10-CM | POA: Insufficient documentation

## 2021-05-04 DIAGNOSIS — Z7982 Long term (current) use of aspirin: Secondary | ICD-10-CM | POA: Insufficient documentation

## 2021-05-04 LAB — COMPREHENSIVE METABOLIC PANEL
ALT: 54 U/L — ABNORMAL HIGH (ref 0–44)
AST: 30 U/L (ref 15–41)
Albumin: 4.2 g/dL (ref 3.5–5.0)
Alkaline Phosphatase: 148 U/L — ABNORMAL HIGH (ref 38–126)
Anion gap: 7 (ref 5–15)
BUN: 15 mg/dL (ref 8–23)
CO2: 27 mmol/L (ref 22–32)
Calcium: 9.8 mg/dL (ref 8.9–10.3)
Chloride: 102 mmol/L (ref 98–111)
Creatinine, Ser: 1.17 mg/dL (ref 0.61–1.24)
GFR, Estimated: >60 mL/min (ref >60)
Glucose, Bld: 95 mg/dL (ref 70–99)
Potassium: 4.7 mmol/L (ref 3.5–5.1)
Sodium: 136 mmol/L (ref 135–145)
Total Bilirubin: 0.6 mg/dL (ref 0.3–1.2)
Total Protein: 8.4 g/dL — ABNORMAL HIGH (ref 6.5–8.1)

## 2021-05-04 LAB — CBC
HCT: 47.9 % (ref 39.0–52.0)
Hemoglobin: 15.6 g/dL (ref 13.0–17.0)
MCH: 31 pg (ref 26.0–34.0)
MCHC: 32.6 g/dL (ref 30.0–36.0)
MCV: 95.2 fL (ref 80.0–100.0)
Platelets: 310 10*3/uL (ref 150–400)
RBC: 5.03 MIL/uL (ref 4.22–5.81)
RDW: 13.5 % (ref 11.5–15.5)
WBC: 6.5 10*3/uL (ref 4.0–10.5)
nRBC: 0 % (ref 0.0–0.2)

## 2021-05-04 NOTE — ED Provider Notes (Signed)
Emergency Medicine Provider Triage Evaluation Note  Cameron Hoover , a 65 y.o. male  was evaluated in triage.  Pt complains of black stool.  He has a history of GERD, dysphagia and peptic ulcer.  He is also had esophageal dysphagia, and had endoscopy done January 2021.  He continues to take a PPI.  He states he has had black stools for 3 weeks.  He is not taking iron pills.  Review of Systems  Positive: Dark stool Negative: No dizziness, no chest pain, no shortness of breath  Physical Exam  BP 136/67 (BP Location: Left Arm)   Pulse 64   Temp 97.8 F (36.6 C) (Oral)   Resp 16   Ht 5\' 6"  (1.676 m)   Wt 83 kg   SpO2 97%   BMI 29.54 kg/m  Gen:   Awake, no distress   Resp:  Normal effort  MSK:   Moves extremities without difficulty  Other:  Abdomen somewhat distended but nontender to palpation  Medical Decision Making  Medically screening exam initiated at 9:08 PM.  Appropriate orders placed.  was informed that the remainder of the evaluation will be completed by another provider, this initial triage assessment does not replace that evaluation, and the importance of remaining in the ED until their evaluation is complete.  Signs and symptoms of upper GI bleed   [name], MD 05/05/21 [phone]

## 2021-05-04 NOTE — Telephone Encounter (Signed)
FYI: pt returned call and at this point he doesn't want ov to consider colonoscopy, but he told me he had another issue and he proceeded to tell me that for the past 3 or 4 weeks he stated that his ulcers have been bleeding. I asked the pt why hasn't he called or went to the ED he thought it would stop on its own. (The pt was driving with the window down on [address] and it was very hard to hear what he said, I asked him to pull over but I guess he had somewhere to be). I couldn't understand how much blood or where it was coming from for that fact. I advised the pt due to it being so long with this issue to proceed to the ED to be evaluated. He expressed understanding.
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
## 2021-05-04 NOTE — ED Triage Notes (Signed)
Pt. States they believe they have bleeding ulcers again. Pt. States they have been having black stools for 3-4 weeks now and their stomach hurts.

## 2021-05-05 LAB — TYPE AND SCREEN
ABO/RH(D): B POS
Antibody Screen: POSITIVE
DAT, IgG: NEGATIVE
DAT, complement: NEGATIVE

## 2021-05-05 LAB — POC OCCULT BLOOD, ED: Fecal Occult Bld: NEGATIVE

## 2021-05-05 NOTE — Discharge Instructions (Addendum)
At this point your hemoglobin is normal and there is no blood in your stool.  It is not clear what is causing the dark-colored stool.  Consider seeing your primary care doctor or GI doctor for further evaluation of this problem.  Return here, if needed

## 2021-05-05 NOTE — ED Notes (Signed)
Pt rec'd d/c papers. Pt states that he has been taking pepto for the stomach pain/ulcers. Educated that pepto can make stools dark too. Pt verbalized understanding and said that he will still follow up with GI. Ambulatory to lobby.

## 2021-05-05 NOTE — Telephone Encounter (Signed)
Phoned and LMOVM for the pt to return call 

## 2021-05-05 NOTE — Telephone Encounter (Signed)
Noted. Patient was seen in ER yesterday. Reviewed labs. Hgb normal. Had been taking Pepto which may be reason for dark stools. His LFTs were elevated.   Would offer follow up ov here if patient is interested in pursuing work up of abnormal lfts and stomach pain.

## 2021-05-06 NOTE — Telephone Encounter (Signed)
Phoned and LMOVM for the pt to return call 

## 2021-05-10 NOTE — Telephone Encounter (Signed)
Letter sent to the pt
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
## 2022-01-25 ENCOUNTER — Encounter: Payer: Self-pay | Admitting: Internal Medicine

## 2022-03-16 ENCOUNTER — Ambulatory Visit (INDEPENDENT_AMBULATORY_CARE_PROVIDER_SITE_OTHER): Payer: Medicare PPO

## 2022-03-16 ENCOUNTER — Encounter: Payer: Self-pay | Admitting: Orthopedic Surgery

## 2022-03-16 ENCOUNTER — Ambulatory Visit (INDEPENDENT_AMBULATORY_CARE_PROVIDER_SITE_OTHER): Payer: Medicare PPO | Admitting: Orthopedic Surgery

## 2022-03-16 VITALS — BP 134/82 | HR 63 | Ht 67.0 in | Wt 183.0 lb

## 2022-03-16 DIAGNOSIS — G8929 Other chronic pain: Secondary | ICD-10-CM

## 2022-03-16 DIAGNOSIS — M25561 Pain in right knee: Secondary | ICD-10-CM

## 2022-03-16 DIAGNOSIS — M65311 Trigger thumb, right thumb: Secondary | ICD-10-CM
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
## 2022-05-30 ENCOUNTER — Observation Stay (HOSPITAL_COMMUNITY)
Admission: EM | Admit: 2022-05-30 | Discharge: 2022-05-31 | Disposition: A | Discharge status: Home / Self Care | Payer: Medicare PPO | Attending: Family Medicine | Admitting: Family Medicine

## 2022-05-30 ENCOUNTER — Other Ambulatory Visit: Payer: Self-pay

## 2022-05-30 ENCOUNTER — Observation Stay (HOSPITAL_COMMUNITY): Payer: Medicare PPO

## 2022-05-30 ENCOUNTER — Emergency Department (HOSPITAL_COMMUNITY): Payer: Medicare PPO

## 2022-05-30 DIAGNOSIS — I2510 Atherosclerotic heart disease of native coronary artery without angina pectoris: Secondary | ICD-10-CM | POA: Diagnosis not present

## 2022-05-30 DIAGNOSIS — R41841 Cognitive communication deficit: Secondary | ICD-10-CM | POA: Insufficient documentation

## 2022-05-30 DIAGNOSIS — Z87891 Personal history of nicotine dependence: Secondary | ICD-10-CM | POA: Diagnosis not present

## 2022-05-30 DIAGNOSIS — Z79899 Other long term (current) drug therapy: Secondary | ICD-10-CM | POA: Insufficient documentation

## 2022-05-30 DIAGNOSIS — I5042 Chronic combined systolic (congestive) and diastolic (congestive) heart failure: Secondary | ICD-10-CM | POA: Diagnosis not present

## 2022-05-30 DIAGNOSIS — I110 Hypertensive heart disease with heart failure: Secondary | ICD-10-CM | POA: Insufficient documentation

## 2022-05-30 DIAGNOSIS — I6381 Other cerebral infarction due to occlusion or stenosis of small artery: Principal | ICD-10-CM

## 2022-05-30 DIAGNOSIS — Z7982 Long term (current) use of aspirin: Secondary | ICD-10-CM | POA: Diagnosis not present

## 2022-05-30 DIAGNOSIS — I639 Cerebral infarction, unspecified: Secondary | ICD-10-CM | POA: Diagnosis not present

## 2022-05-30 DIAGNOSIS — R200 Anesthesia of skin: Secondary | ICD-10-CM | POA: Diagnosis present

## 2022-05-30 LAB — URINALYSIS, ROUTINE W REFLEX MICROSCOPIC
Bilirubin Urine: NEGATIVE
Glucose, UA: NEGATIVE mg/dL
Hgb urine dipstick: NEGATIVE
Ketones, ur: NEGATIVE mg/dL
Leukocytes,Ua: NEGATIVE
Nitrite: NEGATIVE
Protein, ur: NEGATIVE mg/dL
Specific Gravity, Urine: 1.015 (ref 1.005–1.030)
pH: 5 (ref 5.0–8.0)

## 2022-05-30 LAB — DIFFERENTIAL
Abs Immature Granulocytes: 0.01 10*3/uL (ref 0.00–0.07)
Basophils Absolute: 0 10*3/uL (ref 0.0–0.1)
Basophils Relative: 0 %
Eosinophils Absolute: 0.2 10*3/uL (ref 0.0–0.5)
Eosinophils Relative: 3 %
Immature Granulocytes: 0 %
Lymphocytes Relative: 27 %
Lymphs Abs: 1.5 10*3/uL (ref 0.7–4.0)
Monocytes Absolute: 0.6 10*3/uL (ref 0.1–1.0)
Monocytes Relative: 11 %
Neutro Abs: 3.2 10*3/uL (ref 1.7–7.7)
Neutrophils Relative %: 59 %

## 2022-05-30 LAB — RAPID URINE DRUG SCREEN, HOSP PERFORMED
Amphetamines: NOT DETECTED
Barbiturates: NOT DETECTED
Benzodiazepines: NOT DETECTED
Cocaine: NOT DETECTED
Opiates: NOT DETECTED
Tetrahydrocannabinol: NOT DETECTED

## 2022-05-30 LAB — CBC
HCT: 49.4 % (ref 39.0–52.0)
HCT: 51.2 % (ref 39.0–52.0)
Hemoglobin: 15.4 g/dL (ref 13.0–17.0)
Hemoglobin: 16.6 g/dL (ref 13.0–17.0)
MCH: 27.4 pg (ref 26.0–34.0)
MCH: 28.4 pg (ref 26.0–34.0)
MCHC: 31.2 g/dL (ref 30.0–36.0)
MCHC: 32.4 g/dL (ref 30.0–36.0)
MCV: 87.9 fL (ref 80.0–100.0)
MCV: 87.7 fL (ref 80.0–100.0)
Platelets: 328 10*3/uL (ref 150–400)
Platelets: 332 10*3/uL (ref 150–400)
RBC: 5.62 MIL/uL (ref 4.22–5.81)
RBC: 5.84 MIL/uL — ABNORMAL HIGH (ref 4.22–5.81)
RDW: 16.8 % — ABNORMAL HIGH (ref 11.5–15.5)
RDW: 16.8 % — ABNORMAL HIGH (ref 11.5–15.5)
WBC: 5.5 10*3/uL (ref 4.0–10.5)
WBC: 5.2 10*3/uL (ref 4.0–10.5)
nRBC: 0 % (ref 0.0–0.2)
nRBC: 0 % (ref 0.0–0.2)

## 2022-05-30 LAB — ETHANOL: Alcohol, Ethyl (B): <10 mg/dL (ref <10)

## 2022-05-30 LAB — COMPREHENSIVE METABOLIC PANEL
ALT: 45 U/L — ABNORMAL HIGH (ref 0–44)
AST: 35 U/L (ref 15–41)
Albumin: 3.6 g/dL (ref 3.5–5.0)
Alkaline Phosphatase: 136 U/L — ABNORMAL HIGH (ref 38–126)
Anion gap: 8 (ref 5–15)
BUN: 10 mg/dL (ref 8–23)
CO2: 24 mmol/L (ref 22–32)
Calcium: 9.7 mg/dL (ref 8.9–10.3)
Chloride: 105 mmol/L (ref 98–111)
Creatinine, Ser: 1.13 mg/dL (ref 0.61–1.24)
GFR, Estimated: >60 mL/min (ref >60)
Glucose, Bld: 102 mg/dL — ABNORMAL HIGH (ref 70–99)
Potassium: 4.1 mmol/L (ref 3.5–5.1)
Sodium: 137 mmol/L (ref 135–145)
Total Bilirubin: 0.6 mg/dL (ref 0.3–1.2)
Total Protein: 7.9 g/dL (ref 6.5–8.1)

## 2022-05-30 LAB — CREATININE, SERUM
Creatinine, Ser: 1.17 mg/dL (ref 0.61–1.24)
GFR, Estimated: >60 mL/min (ref >60)

## 2022-05-30 LAB — I-STAT CHEM 8, ED
BUN: 10 mg/dL (ref 8–23)
Calcium, Ion: 1.31 mmol/L (ref 1.15–1.40)
Chloride: 102 mmol/L (ref 98–111)
Creatinine, Ser: 0.9 mg/dL (ref 0.61–1.24)
Glucose, Bld: 106 mg/dL — ABNORMAL HIGH (ref 70–99)
HCT: 51 % (ref 39.0–52.0)
Hemoglobin: 17.3 g/dL — ABNORMAL HIGH (ref 13.0–17.0)
Potassium: 4.1 mmol/L (ref 3.5–5.1)
Sodium: 139 mmol/L (ref 135–145)
TCO2: 24 mmol/L (ref 22–32)

## 2022-05-30 LAB — PROTIME-INR
INR: 1.1 (ref 0.8–1.2)
Prothrombin Time: 13.7 seconds (ref 11.4–15.2)

## 2022-05-30 LAB — APTT: aPTT: 30 seconds (ref 24–36)

## 2022-05-30 MED ORDER — ACETAMINOPHEN 160 MG/5ML PO SOLN: 650.0000 mg | ORAL | Status: DC | PRN

## 2022-05-30 MED ORDER — SENNOSIDES-DOCUSATE SODIUM 8.6-50 MG PO TABS: 1.0000 | ORAL_TABLET | Freq: Every evening | ORAL | Status: DC | PRN

## 2022-05-30 MED ORDER — ATORVASTATIN CALCIUM 40 MG PO TABS
80.0000 mg | ORAL_TABLET | Freq: Every day | ORAL | Status: DC
  Administered 2022-05-30 – 2022-05-31 (×2): 80 mg via ORAL
  Filled 2022-05-30 (×2): qty 2

## 2022-05-30 MED ORDER — ASPIRIN 81 MG PO TBEC
81.0000 mg | DELAYED_RELEASE_TABLET | Freq: Every day | ORAL | Status: DC
  Administered 2022-05-30 – 2022-05-31 (×2): 81 mg via ORAL
  Filled 2022-05-30 (×2): qty 1

## 2022-05-30 MED ORDER — LORATADINE 10 MG PO TABS
10.0000 mg | ORAL_TABLET | Freq: Every day | ORAL | Status: DC
  Administered 2022-05-30 – 2022-05-31 (×2): 10 mg via ORAL
  Filled 2022-05-30 (×2): qty 1

## 2022-05-30 MED ORDER — ACETAMINOPHEN 650 MG RE SUPP: 650.0000 mg | RECTAL | Status: DC | PRN

## 2022-05-30 MED ORDER — MUSCLE RUB 10-15 % EX CREA
TOPICAL_CREAM | CUTANEOUS | Status: DC | PRN
  Filled 2022-05-30: qty 85

## 2022-05-30 MED ORDER — ENOXAPARIN SODIUM 40 MG/0.4ML IJ SOSY
40.0000 mg | PREFILLED_SYRINGE | INTRAMUSCULAR | Status: DC
  Administered 2022-05-30 – 2022-05-31 (×2): 40 mg via SUBCUTANEOUS
  Filled 2022-05-30 (×2): qty 0.4

## 2022-05-30 MED ORDER — CYCLOBENZAPRINE HCL 10 MG PO TABS
10.0000 mg | ORAL_TABLET | Freq: Every evening | ORAL | Status: DC | PRN
  Administered 2022-05-30: 10 mg via ORAL
  Filled 2022-05-30: qty 1

## 2022-05-30 MED ORDER — POLYVINYL ALCOHOL 1.4 % OP SOLN
1.0000 [drp] | OPHTHALMIC | Status: DC | PRN
  Filled 2022-05-30: qty 15

## 2022-05-30 MED ORDER — LORAZEPAM 1 MG PO TABS
1.0000 mg | ORAL_TABLET | Freq: Once | ORAL | Status: AC | PRN
  Administered 2022-05-30: 1 mg via ORAL
  Filled 2022-05-30: qty 1

## 2022-05-30 MED ORDER — ACETAMINOPHEN 325 MG PO TABS: 650.0000 mg | ORAL_TABLET | ORAL | Status: DC | PRN

## 2022-05-30 MED ORDER — CARBOXYMETHYLCELLULOSE SODIUM 0.5 % OP SOLN: 1.0000 [drp] | Freq: Every day | OPHTHALMIC | Status: DC | PRN

## 2022-05-30 MED ORDER — STROKE: EARLY STAGES OF RECOVERY BOOK
Freq: Once | Status: AC
  Administered 2022-05-31: 1
  Filled 2022-05-30: qty 1

## 2022-05-30 MED ORDER — PANTOPRAZOLE SODIUM 20 MG PO TBEC
20.0000 mg | DELAYED_RELEASE_TABLET | Freq: Every morning | ORAL | Status: DC
  Administered 2022-05-31: 20 mg via ORAL
  Filled 2022-05-30: qty 1

## 2022-05-30 MED ORDER — MENTHOL (TOPICAL ANALGESIC) 4 % EX GEL: 1.0000 "application " | Freq: Two times a day (BID) | CUTANEOUS | Status: DC | PRN

## 2022-05-30 NOTE — Assessment & Plan Note (Addendum)
Right hemibody sensory deficits resolving, minimal today per patient.  Echo shows LVEF 50% with normal LA.  A1c 5.8%, within goal.  LDL WNL.  MRA shows mild to moderate multifocal stenosis involving carotid siphons and proximal left M2 segment, negative LVO. -Start Plavix 75 mg, per neurology recommendations.  Will do DAPT for 3 weeks, with aspirin alone if patient was compliant on ASA prior to stroke. -F/u carotid Dopplers -Continue ASA 81 mg, statin

## 2022-05-30 NOTE — ED Notes (Signed)
RN went to the waiting room to take pt back to H12. Pt is currently in MRI. MRI will take pt back to H12 after scans.
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
## 2022-05-30 NOTE — ED Notes (Signed)
Pt connected and admitted to portable tele monitor.

## 2022-05-30 NOTE — ED Triage Notes (Signed)
Pt. Stated, I feel numb on the left side of my body and some burning feeling.. I noticed it yesterday morning.  No arm drift, bilateral hand grips, symmetrical smile.

## 2022-05-30 NOTE — ED Notes (Signed)
Pt to MRI

## 2022-05-30 NOTE — Progress Notes (Signed)
FMTS Brief Progress Note  S: Patient seen with Dr. Marcha Dutton for evening rounds. Still with mild sensation of numbness on his right side. Otherwise denies complaints.   O: BP 135/74   Pulse 89   Temp 98.3 F (36.8 C) (Oral)   Resp (!) 21   Ht 5\' 6"  (1.676 m)   Wt 83.5 kg   SpO2 99%   BMI 29.70 kg/m   Gen: alert, NAD Resp: normal effort on room air, lungs CTAB CV: RRR, 2/6 murmur appreciated Neuro: CN II-XII intact, 5/5 strength in all extremities, slightly diminished sensation V2 and V3, sensation otherwise in tact to light touch, normal speech Psych: very pleasant, conversant   A/P: Acute L Thalamic Infarct MRA without large vessel occlusion, but does show mild to moderate multifocal stenosis of carotid siphons and proximal L M2.  No changes to pan per H&P.  - Orders reviewed. Labs for AM ordered, which was adjusted as needed.    Alcus Dad, MD 05/30/2022, 9:43 PM PGY-3, [facility] Night Resident  Please page [phone] with questions.

## 2022-05-30 NOTE — H&P (Addendum)
Hospital Admission History and Physical Service Pager: [phone]  Patient name: Cameron Hoover Medical record number: [id number] Date of Birth: Jul 10, 1956 Age: 66 y.o. Gender: male  Primary Care Provider: [facility] Consultants: Neurology (ED) Code Status: FULL Preferred Emergency Contact: Verdene Lennert, ex-wife Contact Information     Name Relation Home Work Mobile   Abt,Veronica Spouse [phone]          Chief Complaint: numbness  Assessment and Plan: Cameron Hoover is a 66 y.o. 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   CVA (cerebral vascular accident) [facility])  Patient presenting with right hemibody sensory deficits and dysarthria with no prior hx of CVAs and found to have L thalamic stroke, confirmed on MRI. CT shows old lacunar infarcts in the cerebellar hemispheres and calcified atherosclerotic changes in the intracranial portions of the carotid arteries. Has multiple risk factors including: smoking 1ppd for 50 years, HTN,  HLD, and CAD.  Admits to missing doses of his medication at times. Home meds: Aspirin, Lipitor, olmesartan. - Admit to FMTS, med-telemetry, attending Dr. Thompson Grayer - Cardiac monitoring - Neurology following, appreciate care and recommendations - F/u MRA, carotid Dopplers - continue ASA 81 mg, statin - hold home antihypertensives for permissive HTN - F/u echocardiogram and EKG - F/u A1C, Lipid panel, CBC, CMP - PT/OT/SLP   All other conditions are chronic and stable: HLD - Lipitor 80 mg  HTN - holding olmesartan, pending neuro recs   FEN/GI: Heart Healthy  VTE Prophylaxis: Lovenox  Disposition: Admit to Med-Tele  History of Present Illness:  Cameron Hoover is a 66 y.o. male presenting with right-sided numbness.  Patient reports he started having numbness and tingling in his right hand  that started yesterday morning around 8:00 AM. He has had some "warm sensation" to the right side of his face and lips as well as numbness noticed more when eating. He has had some "tightness" in his right arm and leg since yesterday. He thinks he has had a bit of speech change as well. Denies weakness, dizziness, vision changes. No history of similar episodes.  In the ED, patient was found to have right-sided neurodeficits, CBC CMP EtOH INR mostly unremarkable.  CT without contrast and MRI were ordered, which identified punctate infarcts at the left thalamus.  Review Of Systems: Per HPI with the following additions:  Review of Systems  Eyes:  Negative for blurred vision.  Respiratory:  Negative for shortness of breath.   Cardiovascular:  Negative for chest pain.  Neurological:  Positive for sensory change. Negative for focal weakness and weakness.     Pertinent Past Medical History: GERD, chronic dysphagia CAD s/p PCI 2017 HLD GI bleed 2018 "Pinched nerve" in his back Remainder reviewed in history tab.   Pertinent Past Surgical History: PCI 2017 EGD  Remainder reviewed in history tab.  Pertinent Social History: Tobacco use: Yes/No/Former 1ppd for 50 years Alcohol use: few beers 2-3 per night Other Substance use: smokes THC once per week Lives with self  Pertinent Family History: Father - cancer CAD in father's side of family Mother - HTN, DM  Remainder reviewed in history tab.   Important Outpatient Medications: Aspirin 81 Lipitor 80 Albuterol as needed Celebrex Metoprolol (reports discontinued) Olmesartan Protonix 20 Remainder reviewed in medication history.   Objective: BP (!) 114/50 (BP  Location: Right Arm)   Pulse 83   Temp 98 F (36.7 C) (Oral)   Resp 15   Ht 5\' 6"  (1.676 m)   Wt 83.5 kg   SpO2 100%   BMI 29.70 kg/m  Neuro Mental Status AAOx4 Language: Fluency, Comprehension, Naming intact.  Some mild dysarthria.  Cranial Nerves II: Nor  abnormalities in visual fields. III, IV, VI: EOM intact, no gaze preference or deviation, no nystagmus  V: Diminished sensation in V1, V2, and V3 segments on right, normal on left  VII: Facial symmetry at rest and during various facial expressions VIII: Normal hearing to speech  IX, X: Normal palatal elevation, no uvular deviation  XI: 5/5 head turn and 5/5 shoulder shrug bilaterally XII: Symmetric tongue movement, tongue is midline without atrophy or fasciculations.   Motor Strength R UE: 4.5/5 L UE: 5/5 R LE: 4.5/5 L LE: 5/5  Sensory  Diminished sensation of RUE and RLE.   Cerebellar: Pronator Drift: negative  Labs:  CBC BMET  Recent Labs  Lab 05/30/22 1650  WBC 5.2  HGB 16.6  HCT 51.2  PLT 332   Recent Labs  Lab 05/30/22 0936 05/30/22 1008 05/30/22 1650  NA 137 139  --   K 4.1 4.1  --   CL 105 102  --   CO2 24  --   --   BUN 10 10  --   CREATININE 1.13 0.90 1.17  GLUCOSE 102* 106*  --   CALCIUM 9.7  --   --        EKG: pending   Imaging Studies Performed:  MR BRAIN WO CONTRAST  Result Date: 05/30/2022 CLINICAL DATA:  Neuro deficit, acute, stroke suspected EXAM: MRI HEAD WITHOUT CONTRAST TECHNIQUE: Multiplanar, multiecho pulse sequences of the brain and surrounding structures were obtained without intravenous contrast. COMPARISON:  CT head from the same day. FINDINGS: Brain: Punctate acute infarct in the left thalamus. Slight edema without mass effect. No midline shift. No acute hemorrhage, hydrocephalus or mass lesion. Remote left cerebellar infarcts. Patchy T2/FLAIR hyperintensities in the white matter, compatible with chronic microvascular ischemic disease. Vascular: Major arterial flow voids are maintained at the skull base. Skull and upper cervical spine: Normal marrow signal. Sinuses/Orbits: Clear sinuses.  No acute orbital findings. Other: No mastoid effusions. IMPRESSION: Punctate acute infarct in the left thalamus. Electronically Signed   By:  06/01/2022 M.D.   On: 05/30/2022 12:36   CT HEAD WO CONTRAST  Result Date: 05/30/2022 CLINICAL DATA:  Neuro deficit. Stroke suspected. Right-sided numbness. EXAM: CT HEAD WITHOUT CONTRAST TECHNIQUE: Contiguous axial images were obtained from the base of the skull through the vertex without intravenous contrast. RADIATION DOSE REDUCTION: This exam was performed according to the departmental dose-optimization program which includes automated exposure control, adjustment of the mA and/or kV according to patient size and/or use of iterative reconstruction technique. COMPARISON:  CT scan of the brain November 07, 2019 FINDINGS: Brain: No subdural, epidural, or subarachnoid hemorrhage. Lacunar infarcts in the superior aspect of the bilateral cerebellar hemispheres. Cerebellum, brainstem, and basal cisterns otherwise normal. No mass effect or midline shift. Ventricles and sulci are normal. No acute cortical ischemia or infarct is identified. Vascular: Calcified atherosclerotic changes are identified in the intracranial portions of the carotid arteries. Skull: Normal. Negative for fracture or focal lesion. Sinuses/Orbits: No acute finding. Other: None. IMPRESSION: 1. No acute intracranial abnormalities identified. 2. Lacunar infarcts in the superior aspect of the bilateral cerebellar hemispheres. 3. Calcified atherosclerotic changes in the  intracranial portions of the carotid arteries. Electronically Signed   By: Gerome Sam III M.D.   On: 05/30/2022 10:32      Lorri Frederick, MD 05/30/2022, 6:38 PM PGY-1, [facility]  FPTS Intern pager: [phone], text pages welcome Secure chat group [facility] Teaching Service   Upper Level Addendum:  I have reviewed the above note, making necessary revisions as appropriate.  I agree with the medical decision making and physical exam by the resident as noted above.  Littie Deeds, MD PGY-3 [facility] Residency
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
## 2022-05-30 NOTE — ED Notes (Signed)
Dinner tray ordered.

## 2022-05-30 NOTE — ED Provider Triage Note (Signed)
Emergency Medicine Provider Triage Evaluation Note  Cameron Hoover , a 66 y.o. male  was evaluated in triage.  Pt complains of right sided numbness.  States that he woke up at 6 AM yesterday, went back to sleep and at 8:30 AM when he awoke he had numbness in his right arm and leg and face.  It has been persistent since that time.  He is a daily smoker with a history of CHF and MI.  Patient also has hyperlipidemia.  He takes medication for this.  Patient has no other neurologic deficit  Review of Systems  Positive: Numbness Negative: Difficulty with speech  Physical Exam  BP 134/72 (BP Location: Right Arm)   Pulse 83   Temp 98 F (36.7 C) (Oral)   Resp 19   SpO2 96%  Gen:   Awake, no distress   Resp:  Normal effort  MSK:   Moves extremities without difficulty  Other:  Right sided sensory deficit, 4 out of 5 strength in the right upper and lower extremity  Medical Decision Making  Medically screening exam initiated at 9:38 AM.  Appropriate orders placed.  Cameron Hoover was informed that the remainder of the evaluation will be completed by another provider, this initial triage assessment does not replace that evaluation, and the importance of remaining in the ED until their evaluation is complete.  Work-up initiated   Margarita Mail, PA-C 05/30/22 7829

## 2022-05-31 ENCOUNTER — Observation Stay (HOSPITAL_BASED_OUTPATIENT_CLINIC_OR_DEPARTMENT_OTHER): Payer: Medicare PPO

## 2022-05-31 ENCOUNTER — Other Ambulatory Visit (HOSPITAL_COMMUNITY): Payer: Self-pay

## 2022-05-31 ENCOUNTER — Telehealth (HOSPITAL_COMMUNITY): Payer: Self-pay

## 2022-05-31 ENCOUNTER — Other Ambulatory Visit: Payer: Self-pay | Admitting: Neurology

## 2022-05-31 DIAGNOSIS — I63332 Cerebral infarction due to thrombosis of left posterior cerebral artery: Secondary | ICD-10-CM

## 2022-05-31 DIAGNOSIS — R0989 Other specified symptoms and signs involving the circulatory and respiratory systems: Secondary | ICD-10-CM

## 2022-05-31 DIAGNOSIS — I6389 Other cerebral infarction: Secondary | ICD-10-CM | POA: Diagnosis not present

## 2022-05-31 DIAGNOSIS — I639 Cerebral infarction, unspecified: Secondary | ICD-10-CM

## 2022-05-31 DIAGNOSIS — I6381 Other cerebral infarction due to occlusion or stenosis of small artery: Secondary | ICD-10-CM | POA: Diagnosis not present

## 2022-05-31 LAB — ECHOCARDIOGRAM COMPLETE
AR max vel: 1.51 cm2
AV Area VTI: 1.53 cm2
AV Area mean vel: 1.41 cm2
AV Mean grad: 10 mmHg
AV Peak grad: 18.8 mmHg
Ao pk vel: 2.17 m/s
Area-P 1/2: 3.72 cm2
Height: 66 in
P 1/2 time: 460 msec
S' Lateral: 3.9 cm
Single Plane A4C EF: 43.1 %
Weight: 2944 oz

## 2022-05-31 LAB — CBC
HCT: 44.4 % (ref 39.0–52.0)
Hemoglobin: 14.8 g/dL (ref 13.0–17.0)
MCH: 28.8 pg (ref 26.0–34.0)
MCHC: 33.3 g/dL (ref 30.0–36.0)
MCV: 86.4 fL (ref 80.0–100.0)
Platelets: 340 10*3/uL (ref 150–400)
RBC: 5.14 MIL/uL (ref 4.22–5.81)
RDW: 16.6 % — ABNORMAL HIGH (ref 11.5–15.5)
WBC: 6.3 10*3/uL (ref 4.0–10.5)
nRBC: 0 % (ref 0.0–0.2)

## 2022-05-31 LAB — LIPID PANEL
Cholesterol: 125 mg/dL (ref 0–200)
HDL: 27 mg/dL — ABNORMAL LOW (ref >40)
LDL Cholesterol: 65 mg/dL (ref 0–99)
Total CHOL/HDL Ratio: 4.6 RATIO
Triglycerides: 166 mg/dL — ABNORMAL HIGH (ref <150)
VLDL: 33 mg/dL (ref 0–40)

## 2022-05-31 LAB — BASIC METABOLIC PANEL
Anion gap: 8 (ref 5–15)
BUN: 14 mg/dL (ref 8–23)
CO2: 23 mmol/L (ref 22–32)
Calcium: 9.1 mg/dL (ref 8.9–10.3)
Chloride: 104 mmol/L (ref 98–111)
Creatinine, Ser: 1.22 mg/dL (ref 0.61–1.24)
GFR, Estimated: >60 mL/min (ref >60)
Glucose, Bld: 111 mg/dL — ABNORMAL HIGH (ref 70–99)
Potassium: 4.6 mmol/L (ref 3.5–5.1)
Sodium: 135 mmol/L (ref 135–145)

## 2022-05-31 LAB — HEMOGLOBIN A1C
Hgb A1c MFr Bld: 5.8 % — ABNORMAL HIGH (ref 4.8–5.6)
Mean Plasma Glucose: 119.76 mg/dL

## 2022-05-31 LAB — HIV ANTIBODY (ROUTINE TESTING W REFLEX): HIV Screen 4th Generation wRfx: NONREACTIVE

## 2022-05-31 MED ORDER — CLOPIDOGREL BISULFATE 75 MG PO TABS
75.0000 mg | ORAL_TABLET | Freq: Every day | ORAL | Status: DC
  Administered 2022-05-31: 75 mg via ORAL
  Filled 2022-05-31: qty 1

## 2022-05-31 MED ORDER — CLOPIDOGREL BISULFATE 75 MG PO TABS
75.0000 mg | ORAL_TABLET | Freq: Every day | ORAL | 0 refills | Status: AC
  Filled 2022-05-31: qty 20, 20d supply, fill #0

## 2022-05-31 NOTE — Progress Notes (Signed)
Carotid duplex  has been completed. Refer to [facility] under chart review to view preliminary results.   05/31/2022  9:34 AM Cameron Hoover, Bonnye Fava

## 2022-05-31 NOTE — TOC Benefit Eligibility Note (Signed)
Patient Teacher, English as a foreign language completed.    The patient is currently admitted and upon discharge could be taking Jardiance 10mg .  The current 30 day co-pay is $10.35.   The patient is currently admitted and upon discharge could be taking Farxiga 10mg .  The current 30 day co-pay is $10.35.   The patient is insured through [place] [place] Patient Advocate Specialist [place] Patient Advocate Team Direct Number: [phone] Fax: [phone]

## 2022-05-31 NOTE — Discharge Summary (Signed)
[facility] Discharge Summary  Patient name: Cameron Hoover Medical record number: [id number] Date of birth: Feb 25, 1956 Age: 66 y.o. Gender: male Date of Admission: 05/30/2022  Date of Discharge: 06/01/2022 Admitting Physician: Cameron Slates, MD  Primary Care Provider: Al[facility] Hoover Consultants: Neurology  Indication for Hospitalization: Stroke  Brief Hospital Course:  Cameron Hoover is a 66 y.o. male with PMHx of CAD, NSTEMI (05/2015), HLD, GERD, and GI bleed who presented with dysarthria and right sided hemisensory changes on RUE and RLE and found to have an acute left thalamic stroke. A brief hospital course is below.   CVA of left thalamus Presented with right hemibody sensory deficits and dysarthria. VSS. Exam overall reassuring. CT with old lacunar infarcts in the cerebellar hemispheres and calcifications in carotid arteries. MRI with acute L thalamic stroke. MRA without LVO but with moderate stenosis of carotid siphons and proximal L M2. Neurology consulted; he was started on ASA 81 mg initially started Plavix 75 mg for DAPT for 3 weeks, then aspirin alone.  Neuro recommended echo demonstrated LVEF 50% G1DD and no shunts. Carotid US demonstrated no significant occlusions or stenosis. By discharge, patient was well appearing with some decreased sensation in right hand and face but no new neuro deficits. Seen by PT and neurology and no further recommendations prior to discharge.   Other chronic and stable conditions: HLD - continued lipitor 80 mg  HTN - held olmesartan for permissive HTN, held at discharge due to soft/normotensive BP  Issues for follow up: Ensure continuing DAPT (plavix until Nov 21 (21 days)) and then aspirin alone  Disposition: Home  Discharge Condition: Stable  Discharge Exam: per Dr. Deveron Hoover exam: AAOx4 Language: Fluency, Comprehension, Naming intact.  Cranial Nerves II: Nor abnormalities in visual  fields. III, IV, VI: EOM intact, no gaze preference or deviation, no nystagmus  V: Diminished sensation in V1, V2, and V3 segments on right, normal on left  VII: Facial symmetry at rest and during various facial expressions VIII: Normal hearing to speech  IX, X: Normal palatal elevation, no uvular deviation  XI: 5/5 head turn and 5/5 shoulder shrug bilaterally XII: Symmetric tongue movement, tongue is midline without atrophy or fasciculations.  Motor Strength R UE: 5/5 L UE: 5/5 R LE: 5/5 L LE: 5/5 Sensory  Intact sensation of RUE and RLE.    Significant Procedures: None  Significant Labs and Imaging:  Recent Labs  Lab 05/30/22 0936 05/30/22 1008 05/30/22 1650 05/31/22 0033  WBC 5.5  --  5.2 6.3  HGB 15.4 17.3* 16.6 14.8  HCT 49.4 51.0 51.2 44.4  PLT 328  --  332 340   Recent Labs  Lab 05/30/22 0936 05/30/22 1008 05/30/22 1650 05/31/22 0033  NA 137 139  --  135  K 4.1 4.1  --  4.6  CL 105 102  --  104  CO2 24  --   --  23  GLUCOSE 102* 106*  --  111*  BUN 10 10  --  14  CREATININE 1.13 0.90 1.17 1.22  CALCIUM 9.7  --   --  9.1  ALKPHOS 136*  --   --   --   AST 35  --   --   --   ALT 45*  --   --   --   ALBUMIN 3.6  --   --   --     Results/Tests Pending at Time of Discharge:   Discharge Medications:  Allergies as of 05/31/2022   No Known Allergies      Medication List     STOP taking these medications    celecoxib 200 MG capsule Commonly known as: CELEBREX   metoprolol succinate 25 MG 24 hr tablet Commonly known as: TOPROL-XL   olmesartan 20 MG tablet Commonly known as: BENICAR       TAKE these medications    albuterol 108 (90 Base) MCG/ACT inhaler Commonly known as: VENTOLIN HFA Inhale 2 puffs into the lungs 2 (two) times daily as needed for wheezing or shortness of breath.   aspirin EC 81 MG tablet Take 1 tablet (81 mg total) by mouth daily.   atorvastatin 80 MG tablet Commonly known as: LIPITOR Take 1 tablet (80 mg total) by  mouth daily. 3rd request. Please schedule office visit before any future refills. What changed: additional instructions   Biofreeze Cool The Pain 4 % Gel Generic drug: Menthol (Topical Analgesic) Apply 1 application  topically 2 (two) times daily as needed (knee pain).   carboxymethylcellulose 0.5 % Soln Commonly known as: REFRESH PLUS Place 1 drop into both eyes daily as needed (dry eyes).   cetirizine 10 MG tablet Commonly known as: ZYRTEC Take 10 mg by mouth daily.   cholecalciferol 1000 units tablet Commonly known as: VITAMIN D Take 5,000 Units by mouth daily.   clopidogrel 75 MG tablet Commonly known as: PLAVIX Take 1 tablet (75 mg total) by mouth daily for 20 days. Start taking on: June 01, 2022   cyclobenzaprine 10 MG tablet Commonly known as: FLEXERIL Take 10 mg by mouth at bedtime.   pantoprazole 20 MG tablet Commonly known as: PROTONIX Take 20 mg by mouth every morning.        Discharge Instructions: Please refer to Patient Instructions section of EMR for full details.  Patient was counseled important signs and symptoms that should prompt return to medical care, changes in medications, dietary instructions, activity restrictions, and follow up appointments.   Follow-Up Appointments:  Follow-up Information     Cameron Hoover. Schedule an appointment as soon as possible for a visit.   Contact information: [address] [place] [address] [phone]                 Cameron Erp, MD 05/31/2022, 4:28 PM PGY-2, [facility]

## 2022-05-31 NOTE — Progress Notes (Signed)
Daily Progress Note Intern Pager: [phone]  Patient name: Cameron Hoover Medical record number: [id number] Date of birth: Aug 13, 1955 Age: 66 y.o. Gender: male  Primary Care Provider: [facility] Consultants: Neurology (ED) Code Status: FULL  Pt Overview and Major Events to Date:  10/30 - admission  Assessment and Plan: Cameron Hoover is a 66 y.o. 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   CVA (cerebral vascular accident) ([place]) Right hemibody sensory deficits resolving, minimal today per patient.  Echo shows LVEF 50% with normal LA.  A1c 5.8%, within goal.  LDL WNL.  MRA shows mild to moderate multifocal stenosis involving carotid siphons and proximal left M2 segment, negative LVO. -Start Plavix 75 mg, per neurology recommendations.  Will do DAPT for 3 weeks, with aspirin alone if patient was compliant on ASA prior to stroke. -F/u carotid Dopplers -Continue ASA 81 mg, statin    Chronic and stable conditions:  HLD - Lipitor 80 mg  HTN - holding olmesartan, pending neuro recs  FEN/GI: Heart Healthy  VTE Prophylaxis: Lovenox Dispo:Pending clinical improvement  Subjective:  Patient evaluated at bedside.  No acute complaints, denies CP, SOB, or HA.  Reports that his sensory deficits have improved, only notes some tingling of his fingertips.  Denies any "tightness" of his RUE and RLE.  Objective: Temp:  [97.8 F (36.6 C)-98.4 F (36.9 C)] 97.8 F (36.6 C) (10/31 1014) Pulse Rate:  [62-89] 73 (10/31 1014) Resp:  [14-23] 14 (10/31 1014) BP: (113-142)/(50-87) 124/87 (10/31 1014) SpO2:  [96 %-100 %] 98 % (10/31 1014) Physical Exam: General: Elderly male, NAD Cardiovascular: RRR, no MRG appreciated Respiratory: CTAB, normal work of breathing on room air Abdomen: Nondistended, nontender, normal active bowel  sounds Neuro Mental Status AAOx4 Language: Fluency, Comprehension, Naming intact.  Cranial Nerves II: Nor abnormalities in visual fields. III, IV, VI: EOM intact, no gaze preference or deviation, no nystagmus  V: Diminished sensation in V1, V2, and V3 segments on right, normal on left  VII: Facial symmetry at rest and during various facial expressions VIII: Normal hearing to speech  IX, X: Normal palatal elevation, no uvular deviation  XI: 5/5 head turn and 5/5 shoulder shrug bilaterally XII: Symmetric tongue movement, tongue is midline without atrophy or fasciculations.  Motor Strength R UE: 5/5 L UE: 5/5 R LE: 5/5 L LE: 5/5 Sensory  Intact sensation of RUE and RLE.   Laboratory: Most recent CBC Lab Results  Component Value Date   WBC 6.3 05/31/2022   HGB 14.8 05/31/2022   HCT 44.4 05/31/2022   MCV 86.4 05/31/2022   PLT 340 05/31/2022   Most recent BMP    Latest Ref Rng & Units 05/31/2022   12:33 AM  BMP  Glucose 70 - 99 mg/dL 111   BUN 8 - 23 mg/dL 14   Creatinine 0.61 - 1.24 mg/dL 1.22   Sodium 135 - 145 mmol/L 135   Potassium 3.5 - 5.1 mmol/L 4.6   Chloride 98 - 111 mmol/L 104   CO2 22 - 32 mmol/L 23   Calcium 8.9 - 10.3 mg/dL 9.1   HIV - nonreactive A1C - 5.8 % Lipid Panel - Chol 125, TGs 166, LDL 65   MR ANGIO HEAD WO CONTRAST Result Date: 05/30/2022 IMPRESSION:  1. Negative intracranial MRA for large vessel occlusion.  2. Intracranial atherosclerotic disease with associated mild to moderate multifocal stenosis involving both carotid siphons and proximal left M2 segment. Distal small vessel atheromatous irregularity.   MR BRAIN WO CONTRAST Result Date: 05/30/2022 IMPRESSION: Punctate acute infarct in the left thalamus.   CT HEAD WO CONTRAST Result Date: 05/30/2022 IMPRESSION:  1. No acute intracranial abnormalities identified.  2. Lacunar infarcts in the superior aspect of the bilateral cerebellar hemispheres.     Lorri Frederick,  MD 05/31/2022, 1:46 PM  PGY-1, [facility] FPTS Intern pager: [phone], text pages welcome Secure chat group [facility] Teaching Service

## 2022-05-31 NOTE — Telephone Encounter (Signed)
Pharmacy Patient Advocate Encounter  Insurance verification completed.    The patient is insured through [employer]   The patient is currently admitted and ran test claims for the following: Jardiance, Farxiga.  Copays and coinsurance results were relayed to Inpatient clinical team.  

## 2022-05-31 NOTE — Progress Notes (Signed)
  Echocardiogram 2D Echocardiogram has been performed.  Cameron Hoover 05/31/2022, 10:16 AM

## 2022-05-31 NOTE — ED Notes (Signed)
Pt finished with OT, headed to vascular study

## 2022-05-31 NOTE — Evaluation (Signed)
Occupational Therapy Evaluation/Discharge Patient Details Name: Cameron Hoover MRN: [id number] DOB: 1956-03-03 Today's Date: 05/31/2022   History of Present Illness Pt is a 66 y/o male who presented with R sided numbness and tingling. MRI brain showed acute L thalamic infarct. PMH: CAD, GERD, GI bleed, HLD   Clinical Impression   PTA, pt lives with a friend, reports Independence in all ADLs, IADLs and mobility without AD. Pt reports staying active at baseline. Pt presents now with remaining deficit of impaired sensation of R hand (tingling) though does not hinder strength, coordination or ability to functionally complete tasks. Pt overall Independent with ADLs and hallway mobility without LOB or safety concerns noted. Educated re: CVA symptoms & need to act fast, as well as safety precautions for impaired sensation of R hand. Pt denies any concerns regarding managing at home, functionally appropriate for DC once deemed medically stable. OT to sign off.     Recommendations for follow up therapy are one component of a multi-disciplinary discharge planning process, led by the attending physician.  Recommendations may be updated based on patient status, additional functional criteria and insurance authorization.   Follow Up Recommendations  No OT follow up    Assistance Recommended at Discharge None  Patient can return home with the following      Functional Status Assessment  Patient has not had a recent decline in their functional status  Equipment Recommendations  None recommended by OT    Recommendations for Other Services       Precautions / Restrictions Precautions Precautions: None Restrictions Weight Bearing Restrictions: No      Mobility Bed Mobility               General bed mobility comments: sitting EOB    Transfers Overall transfer level: Independent Equipment used: None                      Balance Overall balance assessment: Independent                                          ADL either performed or assessed with clinical judgement   ADL Overall ADL's : Independent                 Upper Body Dressing : Independent;Sitting   Lower Body Dressing: Independent               Functional mobility during ADLs: Independent General ADL Comments: able to mobilize around unit without AD and without LOB. Able to manage ADLs, reports he was raking leaves the day before coming in when symptoms started. Educated on signs of CVA and need for immediate medical attention     Vision Baseline Vision/History: 0 No visual deficits Ability to See in Adequate Light: 0 Adequate Patient Visual Report: No change from baseline Vision Assessment?: No apparent visual deficits     Perception     Praxis      Pertinent Vitals/Pain Pain Assessment Pain Assessment: No/denies pain     Hand Dominance Right   Extremity/Trunk Assessment Upper Extremity Assessment Upper Extremity Assessment: RUE deficits/detail RUE Deficits / Details: reports tingling sensation in R hand, does not impact ability to hold items, coordination or strength RUE Sensation: decreased light touch   Lower Extremity Assessment Lower Extremity Assessment: RLE deficits/detail RLE Deficits / Details: reports lower R LE feels tight, reports  OA in knee   Cervical / Trunk Assessment Cervical / Trunk Assessment: Normal   Communication Communication Communication: Other (comment) (some dysarthria)   Cognition Arousal/Alertness: Awake/alert Behavior During Therapy: WFL for tasks assessed/performed Overall Cognitive Status: Within Functional Limits for tasks assessed                                       General Comments       Exercises     Shoulder Instructions      Home Living Family/patient expects to be discharged to:: Private residence Living Arrangements: Non-relatives/Friends (late wife's friend) Available Help at  Discharge: Friend(s);Available PRN/intermittently Type of Home: Mobile home Home Access: Stairs to enter Entrance Stairs-Number of Steps: 3 Entrance Stairs-Rails: Right Home Layout: One level               Home Equipment: None   Additional Comments: reports his wife passed away in 2023/03/05      Prior Functioning/Environment Prior Level of Function : Independent/Modified Independent;Driving               ADLs Comments: enjoys being active, yard work, bought a bicycle for exercise, used to walk 1-2 miles/day        OT Problem List: Impaired sensation      OT Treatment/Interventions:      OT Goals(Current goals can be found in the care plan section) Acute Rehab OT Goals Patient Stated Goal: get back home OT Goal Formulation: All assessment and education complete, DC therapy  OT Frequency:      Co-evaluation              AM-PAC OT "6 Clicks" Daily Activity     Outcome Measure Help from another person eating meals?: None Help from another person taking care of personal grooming?: None Help from another person toileting, which includes using toliet, bedpan, or urinal?: None Help from another person bathing (including washing, rinsing, drying)?: None Help from another person to put on and taking off regular upper body clothing?: None Help from another person to put on and taking off regular lower body clothing?: None 6 Click Score: 24   End of Session Equipment Utilized During Treatment: Gait belt  Activity Tolerance: Patient tolerated treatment well Patient left: Other (comment) (with transport assisting into w/c)  OT Visit Diagnosis: Hemiplegia and hemiparesis Hemiplegia - Right/Left: Right Hemiplegia - dominant/non-dominant: Dominant Hemiplegia - caused by: Cerebral infarction                Time: 6948-5462 OT Time Calculation (min): 11 min Charges:  OT General Charges $OT Visit: 1 Visit OT Evaluation $OT Eval Low Complexity: 1 Low  Cameron Hoover,  OTR/L Acute Rehab Services Office: [phone]   Layla Maw 05/31/2022, 9:17 AM

## 2022-05-31 NOTE — Progress Notes (Addendum)
STROKE TEAM PROGRESS NOTE   INTERVAL HISTORY Patient was seen in bed this AM. He reports right sided hand and face numbness that started last Sunday. He feels that the symptoms mildly improved since being in the hospital. He notes sometimes missing his medications including ASA and Lipitor at home. Denies weakness.  Vitals:   05/31/22 0402 05/31/22 0500 05/31/22 0600 05/31/22 0831  BP:  (!) 116/56 (!) 113/56   Pulse:  62 62   Resp:  (!) 22 19   Temp: 98.4 F (36.9 C)   97.9 F (36.6 C)  TempSrc: Oral   Oral  SpO2:  98% 96%   Weight:      Height:       CBC:  Recent Labs  Lab 05/30/22 0936 05/30/22 1008 05/30/22 1650 05/31/22 0033  WBC 5.5  --  5.2 6.3  NEUTROABS 3.2  --   --   --   HGB 15.4   < > 16.6 14.8  HCT 49.4   < > 51.2 44.4  MCV 87.9  --  87.7 86.4  PLT 328  --  332 340   < > = values in this interval not displayed.   Basic Metabolic Panel:  Recent Labs  Lab 05/30/22 0936 05/30/22 1008 05/30/22 1650 05/31/22 0033  NA 137 139  --  135  K 4.1 4.1  --  4.6  CL 105 102  --  104  CO2 24  --   --  23  GLUCOSE 102* 106*  --  111*  BUN 10 10  --  14  CREATININE 1.13 0.90 1.17 1.22  CALCIUM 9.7  --   --  9.1   Lipid Panel:  Recent Labs  Lab 05/31/22 0033  CHOL 125  TRIG 166*  HDL 27*  CHOLHDL 4.6  VLDL 33  LDLCALC 65   HgbA1c:  Recent Labs  Lab 05/31/22 0033  HGBA1C 5.8*   Urine Drug Screen:  Recent Labs  Lab 05/30/22 1650  LABOPIA NONE DETECTED  COCAINSCRNUR NONE DETECTED  LABBENZ NONE DETECTED  AMPHETMU NONE DETECTED  THCU NONE DETECTED  LABBARB NONE DETECTED    Alcohol Level  Recent Labs  Lab 05/30/22 0937  ETH <10    IMAGING past 24 hours VAS US CAROTID  Result Date: 05/31/2022 Carotid Arterial Duplex Study Patient Name:  BELDON NOWLING  Date of Exam:   05/31/2022 Medical Rec #: [id number]      Accession #:    [id number] Date of Birth: [birth year]       Patient Gender: M Patient Age:   66 years Exam [facility]  Procedure:      VAS US CAROTID Referring Phys: Elmer Picker --------------------------------------------------------------------------------  Indications:      Acute stroke of the left thalamus. Risk Factors:     Hypertension, hyperlipidemia, coronary artery disease, prior                   CVA. Other Factors:    NSTEMI (05/21/15)                   GERD. Comparison Study: No priors. Performing Technologist: Marilynne Halsted RDMS, RVT  Examination Guidelines: A complete evaluation includes B-mode imaging, spectral Doppler, color Doppler, and power Doppler as needed of all accessible portions of each vessel. Bilateral testing is considered an integral part of a complete examination. Limited examinations for reoccurring indications may be performed as noted.  Right Carotid Findings: +----------+--------+--------+--------+------------------+------------------+  PSV cm/sEDV cm/sStenosisPlaque DescriptionComments           +----------+--------+--------+--------+------------------+------------------+ CCA Prox  65      14                                                   +----------+--------+--------+--------+------------------+------------------+ CCA Distal49      13                                intimal thickening +----------+--------+--------+--------+------------------+------------------+ ICA Prox  104     28      1-39%   heterogenous                         +----------+--------+--------+--------+------------------+------------------+ ICA Mid   96      30                                                   +----------+--------+--------+--------+------------------+------------------+ ICA Distal74      25                                                   +----------+--------+--------+--------+------------------+------------------+ ECA       100     15                                                    +----------+--------+--------+--------+------------------+------------------+ +----------+--------+-------+----------------+-------------------+           PSV cm/sEDV cmsDescribe        Arm Pressure (mmHG) +----------+--------+-------+----------------+-------------------+ [id number]            Multiphasic, WNL                    +----------+--------+-------+----------------+-------------------+ +---------+--------+--+--------+--+---------+ VertebralPSV cm/s66EDV cm/s17Antegrade +---------+--------+--+--------+--+---------+  Left Carotid Findings: +----------+--------+--------+--------+------------------+------------------+           PSV cm/sEDV cm/sStenosisPlaque DescriptionComments           +----------+--------+--------+--------+------------------+------------------+ CCA Prox  84      13                                                   +----------+--------+--------+--------+------------------+------------------+ CCA Distal51      15                                intimal thickening +----------+--------+--------+--------+------------------+------------------+ ICA Prox  53      16      1-39%                     intimal thickening +----------+--------+--------+--------+------------------+------------------+ ICA Mid   76      25                                                   +----------+--------+--------+--------+------------------+------------------+  ICA Distal68      18                                                   +----------+--------+--------+--------+------------------+------------------+ ECA       120     13                                                   +----------+--------+--------+--------+------------------+------------------+ +----------+--------+--------+----------------+-------------------+           PSV cm/sEDV cm/sDescribe        Arm Pressure (mmHG) +----------+--------+--------+----------------+-------------------+  Subclavian127             Multiphasic, WNL                    +----------+--------+--------+----------------+-------------------+ +---------+--------+--+--------+--+---------+ VertebralPSV cm/s50EDV cm/s11Antegrade +---------+--------+--+--------+--+---------+   Summary: Right Carotid: Velocities in the right ICA are consistent with a 1-39% stenosis. Left Carotid: Velocities in the left ICA are consistent with a 1-39% stenosis. Vertebrals:  Bilateral vertebral arteries demonstrate antegrade flow. Subclavians: Normal flow hemodynamics were seen in bilateral subclavian              arteries. *See table(s) above for measurements and observations.     Preliminary    MR ANGIO HEAD WO CONTRAST  Result Date: 05/30/2022 CLINICAL DATA:  Follow-up examination for stroke. EXAM: MRA HEAD WITHOUT CONTRAST TECHNIQUE: Angiographic images of the Circle of Willis were acquired using MRA technique without intravenous contrast. COMPARISON:  Prior brain MRI from earlier the same day. FINDINGS: Anterior circulation: Visualized distal cervical segments of the internal carotid arteries are patent with antegrade flow. Petrous segments widely patent bilaterally. Atheromatous change seen throughout both carotid siphons with associated mild to moderate multifocal narrowing. A1 segments patent bilaterally. Normal anterior communicating artery complex. Both anterior cerebral arteries patent without stenosis. M1 segments patent bilaterally. Left M1 bifurcates early. Focal moderate proximal left M2 stenosis, superior division (series 1043, image 5). Additional diffuse small vessel atheromatous irregularity seen throughout the MCA branches bilaterally. Posterior circulation: Vertebral arteries are largely codominant and patent without stenosis. Both PICA patent at their origins. Basilar widely patent to its distal aspect without stenosis. Superior cerebral arteries patent bilaterally. Both PCAs primarily supplied via the basilar  well perfused or distal aspects. Distal small vessel atheromatous irregularity. Anatomic variants: None significant. Other: No intracranial aneurysm. IMPRESSION: 1. Negative intracranial MRA for large vessel occlusion. 2. Intracranial atherosclerotic disease with associated mild to moderate multifocal stenosis involving both carotid siphons and proximal left M2 segment. Distal small vessel atheromatous irregularity. Electronically Signed   By: Jeannine Boga M.D.   On: 05/30/2022 21:00   MR BRAIN WO CONTRAST  Result Date: 05/30/2022 CLINICAL DATA:  Neuro deficit, acute, stroke suspected EXAM: MRI HEAD WITHOUT CONTRAST TECHNIQUE: Multiplanar, multiecho pulse sequences of the brain and surrounding structures were obtained without intravenous contrast. COMPARISON:  CT head from the same day. FINDINGS: Brain: Punctate acute infarct in the left thalamus. Slight edema without mass effect. No midline shift. No acute hemorrhage, hydrocephalus or mass lesion. Remote left cerebellar infarcts. Patchy T2/FLAIR hyperintensities in the white matter, compatible with chronic microvascular ischemic disease. Vascular: Major arterial flow voids are maintained at the skull base. Skull  and upper cervical spine: Normal marrow signal. Sinuses/Orbits: Clear sinuses.  No acute orbital findings. Other: No mastoid effusions. IMPRESSION: Punctate acute infarct in the left thalamus. Electronically Signed   By: Feliberto Harts M.D.   On: 05/30/2022 12:36   CT HEAD WO CONTRAST  Result Date: 05/30/2022 CLINICAL DATA:  Neuro deficit. Stroke suspected. Right-sided numbness. EXAM: CT HEAD WITHOUT CONTRAST TECHNIQUE: Contiguous axial images were obtained from the base of the skull through the vertex without intravenous contrast. RADIATION DOSE REDUCTION: This exam was performed according to the departmental dose-optimization program which includes automated exposure control, adjustment of the mA and/or kV according to patient size  and/or use of iterative reconstruction technique. COMPARISON:  CT scan of the brain November 07, 2019 FINDINGS: Brain: No subdural, epidural, or subarachnoid hemorrhage. Lacunar infarcts in the superior aspect of the bilateral cerebellar hemispheres. Cerebellum, brainstem, and basal cisterns otherwise normal. No mass effect or midline shift. Ventricles and sulci are normal. No acute cortical ischemia or infarct is identified. Vascular: Calcified atherosclerotic changes are identified in the intracranial portions of the carotid arteries. Skull: Normal. Negative for fracture or focal lesion. Sinuses/Orbits: No acute finding. Other: None. IMPRESSION: 1. No acute intracranial abnormalities identified. 2. Lacunar infarcts in the superior aspect of the bilateral cerebellar hemispheres. 3. Calcified atherosclerotic changes in the intracranial portions of the carotid arteries. Electronically Signed   By: Gerome Sam III M.D.   On: 05/30/2022 10:32    PHYSICAL EXAM General: Pleasant, well-appearing  in bed. No acute distress. Skin: Warm and dry. No obvious rash or lesions. Neuro: A&Ox3. CN II-XII intact. No cerebellar abnormalities. Moves all extremities. 5/5 strength in RUE, LUE, RLE, and LLE. Less sensation in right hand and face. Equal sensation on lower extremities. No dysarthria. Psych: Normal mood and affect   ASSESSMENT/PLAN Cameron Hoover is a 66 y.o. 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   Stroke:  left thalamus small infarct, likely due to small vessel disease  CT head No acute abnormality. Small vessel disease. Lacunar infarcts in the superior aspect of the bilateral cerebellar hemispheres. MRI  Punctate acute infarct in the left thalamus. MRA  Negative intracranial MRA for large vessel occlusion. Carotid Doppler  negative 2D Echo EF 50% LDL  65 HgbA1c 5.8 VTE prophylaxis - lovenox aspirin 81 mg daily prior to admission, now on aspirin 81 mg daily and Eliquis (apixaban) daily. Take this combination for three weeks and then aspirin 81mg  alone afterwards. Therapy recommendations:  none Disposition:  home  Hypertension Home meds:  toprol Stable Long-term BP goal normotensive  Hyperlipidemia Home meds:  lipitor 80mg , resumed in hospital LDL 65, goal < 70 Continue statin at discharge  Other Stroke Risk Factors Advanced Age >/= 23  Former Cigarette smoker Coronary artery disease/MI  Other Active Problems Hx of GIB  Hospital day # 0  Kizzie Ide, MD PGY-1 Psychiatry  ATTENDING NOTE: I reviewed above note and agree with the assessment and plan. Pt was seen and examined.   66 year old male with history of hypertension, hyperlipidemia, CAD/MI, GI bleeding admitted for right-sided numbness and tingling.  MRI showed left small thalamic infarct.  EF 50%, carotid Doppler negative.  MRA mild to moderate bilateral ICA siphon, left M2 and left P2 stenosis.  LDL 65, A1c 5.8.  Creatinine 1.22  On exam, patient sitting at  edge of bed, no family at bedside.  Awake alert, orientated x3.  Neuro intact except subjective right hand and facial numbness.  Muscle strength symmetrical.  Finger-to-nose intact.  Etiology for patient stroke likely due to small vessel disease given location.  Recommend aspirin 81 and Plavix 75 DAPT for 3 weeks and then Plavix alone.  Continue Lipitor 80.  PT/OT no recommendation.  Aggressive risk factor modification.  For detailed assessment and plan, please refer to above/below as I have made changes wherever appropriate.   Neurology will sign off. Please call with questions. Pt will follow up with stroke clinic NP at [facility] in about 4 weeks. Thanks for the consult.   Marvel Plan, MD PhD Stroke Neurology 05/31/2022 11:00 PM     To contact Stroke Continuity provider, please refer to [email or web address]. After  hours, contact General Neurology

## 2022-05-31 NOTE — Hospital Course (Addendum)
Cameron Hoover is a 66 y.o. male with PMHx of CAD, NSTEMI (05/2015), HLD, GERD, and GI bleed who presented with dysarthria and right sided hemisensory changes on RUE and RLE and found to have an acute left thalamic stroke. A brief hospital course is below.   CVA of left thalamus Presented with right hemibody sensory deficits and dysarthria. VSS. Exam overall reassuring. CT with old lacunar infarcts in the cerebellar hemispheres and calcifications in carotid arteries. MRI with acute L thalamic stroke. MRA without LVO but with moderate stenosis of carotid siphons and proximal L M2. Neurology consulted; he was started on ASA 81 mg initially started Plavix 75 mg for DAPT for 3 weeks, then***alone.  Neuro recommended echo demonstrated LVEF 5-% with normal LA. Carotid US demonstrated ***. By discharge, ***.   Other chronic and stable conditions: HLD - continued lipitor 80 mg  HTN - held olmesartan for permissive HTN and restarted at d/c ***  Issues for follow up:

## 2022-05-31 NOTE — Progress Notes (Signed)
PT Cancellation Note  Patient Details Name: EDUAR KUMPF MRN: [id number] DOB: [birth year]   Cancelled Treatment:    Reason Eval/Treat Not Completed: PT screened, no needs identified, will sign off; informed by OT no PT needs.  Will sign off.    Reginia Naas 05/31/2022, 12:39 PM Magda Kiel, PT Acute Rehabilitation Services Office:[phone] 05/31/2022
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
## 2022-05-31 NOTE — Discharge Instructions (Addendum)
Dear Cameron Hoover,   Thank you so much for allowing [place] to be part of your care!  You were admitted to [facility] for a stroke. Please take your medicine as described in the medication list (especially plavix for 20 more days along with aspirin. Then continue aspirin alone)   POST-HOSPITAL & CARE INSTRUCTIONS Please let PCP/Specialists know of any changes that were made.  Please see medications section of this packet for any medication changes.   DOCTOR'S APPOINTMENT & FOLLOW UP CARE INSTRUCTIONS  No future appointments.  RETURN PRECAUTIONS: Weakness, bad headache, vision changes  Take care and be well!  [facility]  [place], [place] [address] [phone]
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
## 2022-07-04 ENCOUNTER — Encounter: Payer: Self-pay | Admitting: Adult Health

## 2022-07-04 ENCOUNTER — Ambulatory Visit: Payer: Medicare PPO | Admitting: Adult Health

## 2022-07-04 VITALS — BP 137/69 | HR 72 | Ht 66.0 in | Wt 181.0 lb

## 2022-07-04 DIAGNOSIS — R29818 Other symptoms and signs involving the nervous system: Secondary | ICD-10-CM

## 2022-07-04 DIAGNOSIS — Z09 Encounter for follow-up examination after completed treatment for conditions other than malignant neoplasm: Secondary | ICD-10-CM | POA: Diagnosis not present

## 2022-07-04 DIAGNOSIS — I6381 Other cerebral infarction due to occlusion or stenosis of small artery: Secondary | ICD-10-CM

## 2022-07-04 MED ORDER — CLOPIDOGREL BISULFATE 75 MG PO TABS: 75.0000 mg | ORAL_TABLET | Freq: Every day | ORAL | 3 refills | Status: DC

## 2022-07-04 NOTE — Patient Instructions (Addendum)
Restart clopidogrel 75 mg daily and stop aspirin and continue atorvastatin for secondary stroke prevention  Continue to follow up with PCP regarding cholesterol and blood pressure management  Maintain strict control of hypertension with blood pressure goal below 130/90 and cholesterol with LDL cholesterol (bad cholesterol) goal below 70 mg/dL.   Signs of a Stroke? Follow the BEFAST method:  Balance Watch for a sudden loss of balance, trouble with coordination or vertigo Eyes Is there a sudden loss of vision in one or both eyes? Or double vision?  Face: Ask the person to smile. Does one side of the face droop or is it numb?  Arms: Ask the person to raise both arms. Does one arm drift downward? Is there weakness or numbness of a leg? Speech: Ask the person to repeat a simple phrase. Does the speech sound slurred/strange? Is the person confused ? Time: If you observe any of these signs, call 911.     Overall stable from a stroke standpoint and recommend follow up on an as needed basis      Thank you for coming to see [place] at [facility]. I hope we have been able to provide you high quality care today.  You may receive a patient satisfaction survey over the next few weeks. We would appreciate your feedback and comments so that we may continue to improve ourselves and the health of our patients.    Stroke Prevention Some medical conditions and behaviors can lead to a higher chance of having a stroke. You can help prevent a stroke by eating healthy, exercising, not smoking, and managing any medical conditions you have. Stroke is a leading cause of functional impairment. Primary prevention is particularly important because a majority of strokes are first-time events. Stroke changes the lives of not only those who experience a stroke but also their family and other caregivers. How can this condition affect me? A stroke is a medical emergency and should be treated right away. A  stroke can lead to brain damage and can sometimes be life-threatening. If a person gets medical treatment right away, there is a better chance of surviving and recovering from a stroke. What can increase my risk? The following medical conditions may increase your risk of a stroke: Cardiovascular disease. High blood pressure (hypertension). Diabetes. High cholesterol. Sickle cell disease. Blood clotting disorders (hypercoagulable state). Obesity. Sleep disorders (obstructive sleep apnea). Other risk factors include: Being older than age 79. Having a history of blood clots, stroke, or mini-stroke (transient ischemic attack, TIA). Genetic factors, such as race, ethnicity, or a family history of stroke. Smoking cigarettes or using other tobacco products. Taking birth control pills, especially if you also use tobacco. Heavy use of alcohol or drugs, especially cocaine and methamphetamine. Physical inactivity. What actions can I take to prevent this? Manage your health conditions High cholesterol levels. Eating a healthy diet is important for preventing high cholesterol. If cholesterol cannot be managed through diet alone, you may need to take medicines. Take any prescribed medicines to control your cholesterol as told by your health care provider. Hypertension. To reduce your risk of stroke, try to keep your blood pressure below 130/80. Eating a healthy diet and exercising regularly are important for controlling blood pressure. If these steps are not enough to manage your blood pressure, you may need to take medicines. Take any prescribed medicines to control hypertension as told by your health care provider. Ask your health care provider if you should monitor your blood pressure at  home. Have your blood pressure checked every year, even if your blood pressure is normal. Blood pressure increases with age and some medical conditions. Diabetes. Eating a healthy diet and exercising regularly  are important parts of managing your blood sugar (glucose). If your blood sugar cannot be managed through diet and exercise, you may need to take medicines. Take any prescribed medicines to control your diabetes as told by your health care provider. Get evaluated for obstructive sleep apnea. Talk to your health care provider about getting a sleep evaluation if you snore a lot or have excessive sleepiness. Make sure that any other medical conditions you have, such as atrial fibrillation or atherosclerosis, are managed. Nutrition Follow instructions from your health care provider about what to eat or drink to help manage your health condition. These instructions may include: Reducing your daily calorie intake. Limiting how much salt (sodium) you use to 1,500 milligrams (mg) each day. Using only healthy fats for cooking, such as olive oil, canola oil, or sunflower oil. Eating healthy foods. You can do this by: Choosing foods that are high in fiber, such as whole grains, and fresh fruits and vegetables. Eating at least 5 servings of fruits and vegetables a day. Try to fill one-half of your plate with fruits and vegetables at each meal. Choosing lean protein foods, such as lean cuts of meat, poultry without skin, fish, tofu, beans, and nuts. Eating low-fat dairy products. Avoiding foods that are high in sodium. This can help lower blood pressure. Avoiding foods that have saturated fat, trans fat, and cholesterol. This can help prevent high cholesterol. Avoiding processed and prepared foods. Counting your daily carbohydrate intake.  Lifestyle If you drink alcohol: Limit how much you have to: 0-1 drink a day for women who are not pregnant. 0-2 drinks a day for men. Know how much alcohol is in your drink. In the [place] one drink equals one 12 oz bottle of beer ( ), one 5 oz glass of wine ( ), or one 1 oz glass of hard liquor (67mL). Do not use any products that contain nicotine or tobacco.  These products include cigarettes, chewing tobacco, and vaping devices, such as e-cigarettes. If you need help quitting, ask your health care provider. Avoid secondhand smoke. Do not use drugs. Activity  Try to stay at a healthy weight. Get at least 30 minutes of exercise on most days, such as: Fast walking. Biking. Swimming. Medicines Take over-the-counter and prescription medicines only as told by your health care provider. Aspirin or blood thinners (antiplatelets or anticoagulants) may be recommended to reduce your risk of forming blood clots that can lead to stroke. Avoid taking birth control pills. Talk to your health care provider about the risks of taking birth control pills if: You are over 32 years old. You smoke. You get very bad headaches. You have had a blood clot. Where to find more information American Stroke Association: www.strokeassociation.org Get help right away if: You or a loved one has any symptoms of a stroke. "BE FAST" is an easy way to remember the main warning signs of a stroke: B - Balance. Signs are dizziness, sudden trouble walking, or loss of balance. E - Eyes. Signs are trouble seeing or a sudden change in vision. F - Face. Signs are sudden weakness or numbness of the face, or the face or eyelid drooping on one side. A - Arms. Signs are weakness or numbness in an arm. This happens suddenly and usually on one side of the body. S -  Speech. Signs are sudden trouble speaking, slurred speech, or trouble understanding what people say. T - Time. Time to call emergency services. Write down what time symptoms started. You or a loved one has other signs of a stroke, such as: A sudden, severe headache with no known cause. Nausea or vomiting. Seizure. These symptoms may represent a serious problem that is an emergency. Do not wait to see if the symptoms will go away. Get medical help right away. Call your local emergency services (911 in the [place] Do not drive  yourself to the hospital. Summary You can help to prevent a stroke by eating healthy, exercising, not smoking, limiting alcohol intake, and managing any medical conditions you may have. Do not use any products that contain nicotine or tobacco. These include cigarettes, chewing tobacco, and vaping devices, such as e-cigarettes. If you need help quitting, ask your health care provider. Remember "BE FAST" for warning signs of a stroke. Get help right away if you or a loved one has any of these signs. This information is not intended to replace advice given to you by your health care provider. Make sure you discuss any questions you have with your health care provider. Document Revised: 02/17/2020 Document Reviewed: 02/17/2020 Elsevier Patient Education  2023 [employer].

## 2022-07-07 NOTE — Progress Notes (Signed)
I agree with the above plan 

## 2022-08-03 ENCOUNTER — Telehealth: Payer: Self-pay | Admitting: Gastroenterology

## 2022-08-03 NOTE — Telephone Encounter (Signed)
Request received to transfer GI care from outside practice to [place].  I appreciate the interest in our practice, however due to high demand from patients without established GI providers, I cannot accommodate this transfer.   - HD

## 2022-08-03 NOTE — Telephone Encounter (Signed)
Good afternoon Dr. Loletha Carrow,   Supervising MD for today 1/3 AM    Patient called stating that he wanted to make a new patient appointment with [place] due to not being happy with his care with his current GI provider at [facility]. Patient stated that he is having issues with acid reflux and he feels like they are not wanting to help him. Patient was last seen with [facility] in July of 2022. Patients records are In epic, will you please review and advise on scheduling patient?   Thank you.

## 2022-08-16 NOTE — Telephone Encounter (Signed)
Spoke with patient and advised him that we were not able to accommodate the transfer of care. Patient stated he understood.
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
## 2022-08-23 ENCOUNTER — Encounter: Payer: Self-pay | Admitting: Physician Assistant

## 2022-08-23 ENCOUNTER — Ambulatory Visit: Payer: Medicare HMO | Attending: Physician Assistant | Admitting: Physician Assistant

## 2022-08-23 VITALS — BP 132/68 | HR 79 | Ht 66.0 in | Wt 188.0 lb

## 2022-08-23 DIAGNOSIS — Z716 Tobacco abuse counseling: Secondary | ICD-10-CM

## 2022-08-23 DIAGNOSIS — I2510 Atherosclerotic heart disease of native coronary artery without angina pectoris: Secondary | ICD-10-CM | POA: Diagnosis not present

## 2022-08-23 DIAGNOSIS — I10 Essential (primary) hypertension: Secondary | ICD-10-CM

## 2022-08-23 DIAGNOSIS — E785 Hyperlipidemia, unspecified: Secondary | ICD-10-CM

## 2022-08-23 DIAGNOSIS — I351 Nonrheumatic aortic (valve) insufficiency: Secondary | ICD-10-CM

## 2022-08-23 DIAGNOSIS — I5042 Chronic combined systolic (congestive) and diastolic (congestive) heart failure: Secondary | ICD-10-CM

## 2022-08-23 MED ORDER — NICOTINE 14 MG/24HR TD PT24: 14.0000 mg | MEDICATED_PATCH | Freq: Every day | TRANSDERMAL | 1 refills | Status: DC

## 2022-08-23 NOTE — Patient Instructions (Signed)
Medication Instructions:  Your physician recommends that you continue on your current medications as directed. Please refer to the Current Medication list given to you today.  *If you need a refill on your cardiac medications before your next appointment, please call your pharmacy*   Lab Work: None  If you have labs (blood work) drawn today and your tests are completely normal, you will receive your results only by: [place] (if you have MyChart) OR A paper copy in the mail If you have any lab test that is abnormal or we need to change your treatment, we will call you to review the results.   Follow-Up: At [facility], you and your health needs are our priority.  As part of our continuing mission to provide you with exceptional heart care, we have created designated Provider Care Teams.  These Care Teams include your primary Cardiologist (physician) and Advanced Practice Providers (APPs -  Physician Assistants and Nurse Practitioners) who all work together to provide you with the care you need, when you need it.   Your next appointment:   3 month(s)  Provider:   Dr Johney Frame

## 2022-09-29 ENCOUNTER — Encounter: Payer: Self-pay | Admitting: Radiology
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
## 2022-11-15 ENCOUNTER — Encounter: Payer: Self-pay | Admitting: Physician Assistant

## 2022-11-15 ENCOUNTER — Ambulatory Visit: Payer: Medicare PPO | Attending: Cardiology | Admitting: Physician Assistant

## 2022-11-15 VITALS — BP 114/62 | HR 74 | Ht 66.0 in | Wt 176.6 lb

## 2022-11-15 DIAGNOSIS — I10 Essential (primary) hypertension: Secondary | ICD-10-CM | POA: Diagnosis not present

## 2022-11-15 DIAGNOSIS — I2510 Atherosclerotic heart disease of native coronary artery without angina pectoris: Secondary | ICD-10-CM | POA: Diagnosis not present

## 2022-11-15 DIAGNOSIS — I5042 Chronic combined systolic (congestive) and diastolic (congestive) heart failure: Secondary | ICD-10-CM | POA: Diagnosis not present

## 2022-11-15 DIAGNOSIS — I351 Nonrheumatic aortic (valve) insufficiency: Secondary | ICD-10-CM

## 2022-11-15 DIAGNOSIS — E785 Hyperlipidemia, unspecified: Secondary | ICD-10-CM

## 2022-11-15 DIAGNOSIS — Z720 Tobacco use: Secondary | ICD-10-CM

## 2022-11-15 DIAGNOSIS — K219 Gastro-esophageal reflux disease without esophagitis: Secondary | ICD-10-CM

## 2022-11-15 MED ORDER — PANTOPRAZOLE SODIUM 40 MG PO TBEC: 40.0000 mg | DELAYED_RELEASE_TABLET | Freq: Every day | ORAL | 3 refills | Status: DC

## 2022-11-15 MED ORDER — NICOTINE 21-14-7 MG/24HR TD KIT: PACK | TRANSDERMAL | 0 refills | Status: DC

## 2022-11-15 NOTE — Patient Instructions (Signed)
Medication Instructions:  Start Nicotine patches - Apply 21 mg patch one daily for 6 weeks (dispense #42), then change to 14mg  patch one daily for 2 weeks (dispense #14), then change to 7mg  patch one daily for 2 weeks (dispense #14). Remove old patch before applying new one.  Start Pantoprazole (Protonix) 40 mg daily   *If you need a refill on your cardiac medications before your next appointment, please call your pharmacy*   Lab Work: CBC, CMP- today   If you have labs (blood work) drawn today and your tests are completely normal, you will receive your results only by: MyChart Message (if you have MyChart) OR A paper copy in the mail If you have any lab test that is abnormal or we need to change your treatment, we will call you to review the results.   Testing/Procedures: Your physician has referred you to [facility]     Follow-Up: At [facility], you and your health needs are our priority.  As part of our continuing mission to provide you with exceptional heart care, we have created designated Provider Care Teams.  These Care Teams include your primary Cardiologist (physician) and Advanced Practice Providers (APPs -  Physician Assistants and Nurse Practitioners) who all work together to provide you with the care you need, when you need it.  We recommend signing up for the patient portal called "MyChart".  Sign up information is provided on this After Visit Summary.  MyChart is used to connect with patients for Virtual Visits (Telemedicine).  Patients are able to view lab/test results, encounter notes, upcoming appointments, etc.  Non-urgent messages can be sent to your provider as well.   To learn more about what you can do with MyChart, go to [email or web address].    Your next appointment:   6 month(s)  Provider:   Meriam Sprague, MD     Other Instructions

## 2022-11-16 LAB — COMPREHENSIVE METABOLIC PANEL
ALT: 34 IU/L (ref 0–44)
AST: 27 IU/L (ref 0–40)
Albumin/Globulin Ratio: 1.2 (ref 1.2–2.2)
Albumin: 4.1 g/dL (ref 3.9–4.9)
Alkaline Phosphatase: 159 IU/L — ABNORMAL HIGH (ref 44–121)
BUN/Creatinine Ratio: 19 (ref 10–24)
BUN: 18 mg/dL (ref 8–27)
Bilirubin Total: 0.4 mg/dL (ref 0.0–1.2)
CO2: 19 mmol/L — ABNORMAL LOW (ref 20–29)
Calcium: 9.6 mg/dL (ref 8.6–10.2)
Chloride: 103 mmol/L (ref 96–106)
Creatinine, Ser: 0.96 mg/dL (ref 0.76–1.27)
Globulin, Total: 3.5 g/dL (ref 1.5–4.5)
Glucose: 78 mg/dL (ref 70–99)
Potassium: 4.4 mmol/L (ref 3.5–5.2)
Sodium: 137 mmol/L (ref 134–144)
Total Protein: 7.6 g/dL (ref 6.0–8.5)
eGFR: 88 mL/min/{1.73_m2} (ref >59)

## 2022-11-16 LAB — CBC
Hematocrit: 45.7 % (ref 37.5–51.0)
Hemoglobin: 14.8 g/dL (ref 13.0–17.7)
MCH: 28 pg (ref 26.6–33.0)
MCHC: 32.4 g/dL (ref 31.5–35.7)
MCV: 87 fL (ref 79–97)
Platelets: 296 10*3/uL (ref 150–450)
RBC: 5.28 x10E6/uL (ref 4.14–5.80)
RDW: 13.8 % (ref 11.6–15.4)
WBC: 6.6 10*3/uL (ref 3.4–10.8)
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
## 2022-11-17 ENCOUNTER — Encounter: Payer: Self-pay | Admitting: Gastroenterology

## 2022-11-17 ENCOUNTER — Telehealth: Payer: Self-pay | Admitting: *Deleted

## 2022-11-17 ENCOUNTER — Ambulatory Visit (INDEPENDENT_AMBULATORY_CARE_PROVIDER_SITE_OTHER): Payer: Medicare PPO | Admitting: Gastroenterology

## 2022-11-17 ENCOUNTER — Other Ambulatory Visit: Payer: Self-pay | Admitting: *Deleted

## 2022-11-17 VITALS — BP 121/74 | HR 65 | Temp 97.4°F | Ht 66.0 in | Wt 176.0 lb

## 2022-11-17 DIAGNOSIS — R1319 Other dysphagia: Secondary | ICD-10-CM

## 2022-11-17 DIAGNOSIS — Z86010 Personal history of colonic polyps: Secondary | ICD-10-CM | POA: Diagnosis not present

## 2022-11-17 DIAGNOSIS — K219 Gastro-esophageal reflux disease without esophagitis: Secondary | ICD-10-CM | POA: Diagnosis not present

## 2022-11-17 DIAGNOSIS — R748 Abnormal levels of other serum enzymes: Secondary | ICD-10-CM

## 2022-11-17 MED ORDER — SUCRALFATE 1 G PO TABS: 1.0000 g | ORAL_TABLET | Freq: Three times a day (TID) | ORAL | 0 refills | Status: DC

## 2022-11-17 NOTE — Telephone Encounter (Signed)
Elon Jester, you recently saw this patient who has clearance requested for upcoming EGD. Would you please comment on cardiac risk for upcoming procedure and send response to p cv div preop? I am advising that request to hold Plavix be directed to neurology since last coronary stent was 2016.  Thank you, Marcelino Duster

## 2022-11-17 NOTE — Telephone Encounter (Signed)
LMOVM to return call  us scheduled for 12/01/22, arrive at 9:15 am to check in, NPO after midnight.

## 2022-11-17 NOTE — Patient Instructions (Signed)
We are scheduling you for an upper endoscopy and colonoscopy in the near future with Dr. Marletta Lor.  Please continue taking your pantoprazole 40 mg once daily.  After couple weeks if you are not having improvement in symptoms you may increase to twice daily.   I have sent in Carafate for you to take 1 g 4 times daily for 2 weeks.  You may take the tablet as it comes or you may let it dissolve in 30 cc of water and drink as a slurry.  This is to help coat your stomach and to help with the burning.  Follow a GERD diet:  Avoid fried, fatty, greasy, spicy, citrus foods. Avoid caffeine and carbonated beverages. Avoid chocolate. Try eating 4-6 small meals a day rather than 3 large meals. Do not eat within 3 hours of laying down. Prop head of bed up on wood or bricks to create a 6 inch incline.  Try to limit your alcohol intake.  We will schedule you for an ultrasound of your liver to assess your elevated liver enzymes.  We will see you in 3 months, sooner if needed.  Please not hesitate to reach out if any questions or concerns.  It was a pleasure to see you today. I want to create trusting relationships with patients. If you receive a survey regarding your visit,  I greatly appreciate you taking time to fill this out on paper or through your MyChart. I value your feedback.  Brooke Bonito, MSN, FNP-BC, AGACNP-BC [facility]

## 2022-11-17 NOTE — Telephone Encounter (Signed)
  Request for patient to stop medication prior to procedure or is needing cleareance  11/17/22  Cameron Hoover Sep 06, 1955  What type of surgery is being performed? Esophagogastroduodenoscopy (EGD) with esophageal dilation  When is surgery scheduled? TBD  What type of clearance is required (medical or pharmacy to hold medication or both? Medication   Are there any medications that need to be held prior to surgery and how long? Plavix x 5 days  Name of physician performing surgery?  Dr. Verl Bangs [facility] at [facility] Phone: [phone] Fax: [phone]  Anethesia type (none, local, MAC, general)? MAC

## 2022-11-18 NOTE — Telephone Encounter (Addendum)
   Primary Cardiologist: Meriam Sprague, MD  Chart reviewed as part of pre-operative protocol coverage. Given past medical history and time since last visit, based on ACC/AHA guidelines, Cameron Hoover would be at acceptable risk for the planned procedure without further cardiovascular testing.   Patient was advised that if he develops new symptoms prior to surgery to contact our office to arrange a follow-up appointment.  He verbalized understanding.  Plavix is prescribed for history of stroke, therefore clearance will need to be issued by neurology.   I will route this recommendation to the requesting party via Epic fax function and remove from pre-op pool.  Please call with questions.  Levi Aland, NP-C  11/18/2022, 8:16 AM [address] Office [phone] Fax [phone]

## 2022-11-21 ENCOUNTER — Ambulatory Visit: Payer: Medicare PPO | Admitting: Gastroenterology

## 2022-11-22 NOTE — Telephone Encounter (Signed)
Pt informed of [place] appointment date and time. Verbalized understanding

## 2022-11-24 NOTE — Telephone Encounter (Signed)
Clearance faxed to neurology

## 2022-11-28 ENCOUNTER — Encounter: Payer: Self-pay | Admitting: Neurology

## 2022-11-28 ENCOUNTER — Ambulatory Visit: Payer: Medicare HMO | Admitting: Cardiology

## 2022-11-29 NOTE — Telephone Encounter (Signed)
Per letter in epic "To whom it may concern,  Mr. 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 Mr. Cameron Hoover is on plavix 75 mg daily for stroke prevention. Mr. Cameron Hoover is cleared from neurologic standpoint to move forward with completing the EGD procedure and must hold plavix 5 days prior to the scheduled procedure.  May resume plavix post procedure.     If you have any questions or concerns, please don't hesitate to call.  Sincerely,    Dr Delia Heady"

## 2022-11-30 NOTE — Telephone Encounter (Signed)
Yes, I do. LMOVM for pt to return call

## 2022-12-01 ENCOUNTER — Ambulatory Visit (HOSPITAL_COMMUNITY)
Admission: RE | Admit: 2022-12-01 | Discharge: 2022-12-01 | Disposition: A | Discharge status: Home / Self Care | Payer: Medicare PPO | Source: Ambulatory Visit | Attending: Gastroenterology | Admitting: Gastroenterology

## 2022-12-01 DIAGNOSIS — R748 Abnormal levels of other serum enzymes: Secondary | ICD-10-CM

## 2022-12-02 ENCOUNTER — Encounter: Payer: Self-pay | Admitting: *Deleted

## 2022-12-05 ENCOUNTER — Other Ambulatory Visit: Payer: Self-pay | Admitting: *Deleted

## 2022-12-05 NOTE — Progress Notes (Signed)
error 

## 2022-12-06 ENCOUNTER — Encounter: Payer: Self-pay | Admitting: *Deleted

## 2022-12-06 NOTE — Telephone Encounter (Signed)
LMTRC. Mail letter

## 2023-01-06 ENCOUNTER — Encounter: Payer: Self-pay | Admitting: Gastroenterology

## 2023-02-09 ENCOUNTER — Other Ambulatory Visit (HOSPITAL_COMMUNITY): Payer: Self-pay | Admitting: Adult Health

## 2023-02-09 ENCOUNTER — Telehealth: Payer: Self-pay | Admitting: *Deleted

## 2023-02-09 ENCOUNTER — Encounter (HOSPITAL_COMMUNITY): Payer: Self-pay | Admitting: Adult Health

## 2023-02-09 ENCOUNTER — Encounter: Payer: Self-pay | Admitting: Gastroenterology

## 2023-02-09 ENCOUNTER — Ambulatory Visit (INDEPENDENT_AMBULATORY_CARE_PROVIDER_SITE_OTHER): Payer: Medicare PPO | Admitting: Gastroenterology

## 2023-02-09 VITALS — BP 155/92 | HR 72 | Temp 97.8°F | Ht 66.0 in | Wt 168.4 lb

## 2023-02-09 DIAGNOSIS — R1319 Other dysphagia: Secondary | ICD-10-CM | POA: Diagnosis not present

## 2023-02-09 DIAGNOSIS — M25552 Pain in left hip: Secondary | ICD-10-CM

## 2023-02-09 DIAGNOSIS — Z86010 Personal history of colonic polyps: Secondary | ICD-10-CM | POA: Diagnosis not present

## 2023-02-09 DIAGNOSIS — R748 Abnormal levels of other serum enzymes: Secondary | ICD-10-CM | POA: Diagnosis not present

## 2023-02-09 DIAGNOSIS — K219 Gastro-esophageal reflux disease without esophagitis: Secondary | ICD-10-CM | POA: Diagnosis not present

## 2023-02-09 MED ORDER — SUCRALFATE 1 G PO TABS: 1.0000 g | ORAL_TABLET | Freq: Three times a day (TID) | ORAL | 1 refills | Status: DC

## 2023-02-09 MED ORDER — PANTOPRAZOLE SODIUM 40 MG PO TBEC: 40.0000 mg | DELAYED_RELEASE_TABLET | Freq: Every day | ORAL | 3 refills | Status: DC
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
## 2023-02-09 NOTE — Patient Instructions (Addendum)
We we will place you back on the list to get you scheduled for your upper endoscopy as well as your colonoscopy in the near future with Dr. Marletta Lor.  Increase your pantoprazole to twice daily, 30 minutes prior to breakfast and dinner.  I sent in a refill of Carafate for you.  This will be for another 2 weeks while we get the pantoprazole in your system to see if this provides you any better relief of your reflux.  Follow a GERD diet:  Avoid fried, fatty, greasy, spicy, citrus foods. Avoid caffeine and carbonated beverages. Avoid chocolate. Try eating 4-6 small meals a day rather than 3 large meals. Do not eat within 3 hours of laying down. Prop head of bed up on wood or bricks to create a 6 inch incline.  Although drinking alcohol tends to help you with regular bowel movements, this could be contributing to inflammation in your stomach which can be worsening your reflux.   For your hip pain I would recommend only Tylenol, avoid NSAIDs such as ibuprofen, Aleve, Advil, BC or Goody powders.  As we discussed, I would recommend drinking warm prune juice or apple juice to help with your constipation if you do not want to take any laxatives.   It was a pleasure to see you today. I want to create trusting relationships with patients. If you receive a survey regarding your visit,  I greatly appreciate you taking time to fill this out on paper or through your MyChart. I value your feedback.  Brooke Bonito, MSN, FNP-BC, AGACNP-BC [facility]

## 2023-02-09 NOTE — Telephone Encounter (Signed)
  Request for patient to stop medication prior to procedure or is needing cleareance  02/09/23  Cameron Hoover 01/16/1956  What type of surgery is being performed? COLONOSCOPY/EGD/DILATION  When is surgery scheduled? TBD  What type of clearance is required (medical or pharmacy to hold medication or both? Medication  Are there any medications that need to be held prior to surgery and how long? PLAVIX X 5 DAYS  Name of physician performing surgery?  Dr. Earnest Bailey [facility] Phone: [phone] Fax: [phone]  Anethesia type (none, local, MAC, general)? MAC

## 2023-02-10 ENCOUNTER — Telehealth: Payer: Self-pay | Admitting: *Deleted

## 2023-02-10 ENCOUNTER — Ambulatory Visit (HOSPITAL_COMMUNITY)
Admission: RE | Admit: 2023-02-10 | Discharge: 2023-02-10 | Disposition: A | Discharge status: Home / Self Care | Payer: Medicare PPO | Source: Ambulatory Visit | Attending: Adult Health | Admitting: Adult Health

## 2023-02-10 DIAGNOSIS — M25552 Pain in left hip: Secondary | ICD-10-CM | POA: Insufficient documentation

## 2023-02-10 LAB — HEPATIC FUNCTION PANEL
ALT: 45 IU/L — ABNORMAL HIGH (ref 0–44)
AST: 32 IU/L (ref 0–40)
Albumin: 4.4 g/dL (ref 3.9–4.9)
Alkaline Phosphatase: 159 IU/L — ABNORMAL HIGH (ref 44–121)
Bilirubin Total: 0.3 mg/dL (ref 0.0–1.2)
Bilirubin, Direct: 0.1 mg/dL (ref 0.00–0.40)
Total Protein: 8.2 g/dL (ref 6.0–8.5)

## 2023-02-10 NOTE — Telephone Encounter (Signed)
Spoke with patient and scheduled him for a pre op telehealth visit on 02/15/23. Consent in and meds reviewed. Will route to requesting surgeons office to make them aware.

## 2023-02-10 NOTE — Telephone Encounter (Signed)
   Name: Cameron Hoover  DOB: [birth year]  MRN: [id number]  Primary Cardiologist: Meriam Sprague, MD  Chart reviewed as part of pre-operative protocol coverage. Because of Cameron Hoover's past medical history and time since last visit, he will require a follow-up telephone visit in order to better assess preoperative cardiovascular risk.  Pre-op covering staff: - Please schedule appointment and call patient to inform them. If patient already had an upcoming appointment within acceptable timeframe, please add "pre-op clearance" to the appointment notes so provider is aware. - Please contact requesting surgeon's office via preferred method (i.e, phone, fax) to inform them of need for appointment prior to surgery.  Plavix is not prescribed by our team.  Please seek holding parameters from prescribing physician.  Sharlene Dory, PA-C  02/10/2023, 7:46 AM

## 2023-02-10 NOTE — Telephone Encounter (Signed)
  Patient Consent for Virtual Visit        Cameron Hoover has provided verbal consent on 02/10/2023 for a virtual visit (video or telephone).   CONSENT FOR VIRTUAL VISIT FOR:  Cameron Hoover  By participating in this virtual visit I agree to the following:  I hereby voluntarily request, consent and authorize [facility] and its employed or contracted physicians, physician assistants, nurse practitioners or other licensed health care professionals (the Practitioner), to provide me with telemedicine health care services (the "Services") as deemed necessary by the treating Practitioner. I acknowledge and consent to receive the Services by the Practitioner via telemedicine. I understand that the telemedicine visit will involve communicating with the Practitioner through live audiovisual communication technology and the disclosure of certain medical information by electronic transmission. I acknowledge that I have been given the opportunity to request an in-person assessment or other available alternative prior to the telemedicine visit and am voluntarily participating in the telemedicine visit.  I understand that I have the right to withhold or withdraw my consent to the use of telemedicine in the course of my care at any time, without affecting my right to future care or treatment, and that the Practitioner or I may terminate the telemedicine visit at any time. I understand that I have the right to inspect all information obtained and/or recorded in the course of the telemedicine visit and may receive copies of available information for a reasonable fee.  I understand that some of the potential risks of receiving the Services via telemedicine include:  Delay or interruption in medical evaluation due to technological equipment failure or disruption; Information transmitted may not be sufficient (e.g. poor resolution of images) to allow for appropriate medical decision making by the Practitioner;  and/or  In rare instances, security protocols could fail, causing a breach of personal health information.  Furthermore, I acknowledge that it is my responsibility to provide information about my medical history, conditions and care that is complete and accurate to the best of my ability. I acknowledge that Practitioner's advice, recommendations, and/or decision may be based on factors not within their control, such as incomplete or inaccurate data provided by me or distortions of diagnostic images or specimens that may result from electronic transmissions. I understand that the practice of medicine is not an exact science and that Practitioner makes no warranties or guarantees regarding treatment outcomes. I acknowledge that a copy of this consent can be made available to me via my patient portal [facility] MyChart), or I can request a printed copy by calling the office of [facility].    I understand that my insurance will be billed for this visit.   I have read or had this consent read to me. I understand the contents of this consent, which adequately explains the benefits and risks of the Services being provided via telemedicine.  I have been provided ample opportunity to ask questions regarding this consent and the Services and have had my questions answered to my satisfaction. I give my informed consent for the services to be provided through the use of telemedicine in my medical care

## 2023-02-13 ENCOUNTER — Other Ambulatory Visit: Payer: Self-pay | Admitting: *Deleted

## 2023-02-13 DIAGNOSIS — R7989 Other specified abnormal findings of blood chemistry: Secondary | ICD-10-CM

## 2023-02-13 DIAGNOSIS — R748 Abnormal levels of other serum enzymes: Secondary | ICD-10-CM

## 2023-02-15 ENCOUNTER — Ambulatory Visit: Payer: Medicare PPO | Attending: Cardiology

## 2023-02-15 DIAGNOSIS — Z01810 Encounter for preprocedural cardiovascular examination: Secondary | ICD-10-CM
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
## 2023-02-21 ENCOUNTER — Telehealth: Payer: Self-pay | Admitting: *Deleted

## 2023-02-21 NOTE — Telephone Encounter (Signed)
Patient with history of left thalamic stroke in 05/2022.  Previously seen 07/2022.  Doing well at that time.  As long as he has not had any additional strokes or stroke/TIA symptoms since that time, can proceed with colonoscopy.  Okay to hold Plavix for 5 days as requested with small but acceptable risk of recurrent stroke while off therapy and recommend restarting immediately after or once hemodynamically stable.

## 2023-02-21 NOTE — Telephone Encounter (Signed)
  Request for patient to stop medication prior to procedure or is needing cleareance  02/09/23  Cameron Hoover 01/16/1956  What type of surgery is being performed? COLONOSCOPY/EGD/DILATION  When is surgery scheduled? TBD  What type of clearance is required (medical or pharmacy to hold medication or both? Medication  Are there any medications that need to be held prior to surgery and how long? PLAVIX X 5 DAYS  Name of physician performing surgery?  Dr. Earnest Bailey [facility] Phone: [phone] Fax: [phone]  Anethesia type (none, local, MAC, general)? MAC

## 2023-02-22 MED ORDER — PEG 3350-KCL-NA BICARB-NACL 420 G PO SOLR: 4000.0000 mL | Freq: Once | ORAL | 0 refills | Status: AC

## 2023-02-22 NOTE — Addendum Note (Signed)
Addended by: Armstead Peaks on: 02/22/2023 10:59 AM   Modules accepted: Orders

## 2023-02-22 NOTE — Telephone Encounter (Signed)
LMOVM to call back 

## 2023-02-22 NOTE — Telephone Encounter (Signed)
Pt returned call. He has been scheduled for 8/19. Aware will call back with pre-op appt. Advised when to stop taking his plavix. Rx for prep will be sent to pharmacy. Instructions also sent to pt.

## 2023-02-22 NOTE — Telephone Encounter (Signed)
PA approved via cohere. Authorization #[id number], DOS: 03/20/2023 - 05/20/2023

## 2023-02-23 ENCOUNTER — Ambulatory Visit (HOSPITAL_COMMUNITY)
Admission: RE | Admit: 2023-02-23 | Discharge: 2023-02-23 | Disposition: A | Discharge status: Home / Self Care | Payer: Medicare PPO | Source: Ambulatory Visit | Attending: Gastroenterology | Admitting: Gastroenterology

## 2023-02-23 DIAGNOSIS — R7989 Other specified abnormal findings of blood chemistry: Secondary | ICD-10-CM | POA: Insufficient documentation

## 2023-02-23 DIAGNOSIS — R748 Abnormal levels of other serum enzymes: Secondary | ICD-10-CM | POA: Diagnosis present

## 2023-02-27 ENCOUNTER — Other Ambulatory Visit: Payer: Self-pay | Admitting: *Deleted

## 2023-02-27 ENCOUNTER — Encounter: Payer: Self-pay | Admitting: *Deleted

## 2023-02-27 DIAGNOSIS — K7581 Nonalcoholic steatohepatitis (NASH): Secondary | ICD-10-CM

## 2023-02-27 DIAGNOSIS — R748 Abnormal levels of other serum enzymes: Secondary | ICD-10-CM

## 2023-02-27 DIAGNOSIS — R7989 Other specified abnormal findings of blood chemistry: Secondary | ICD-10-CM

## 2023-03-15 ENCOUNTER — Encounter (HOSPITAL_COMMUNITY)
Admission: RE | Admit: 2023-03-15 | Discharge: 2023-03-15 | Disposition: A | Discharge status: Home / Self Care | Payer: Medicare PPO | Source: Ambulatory Visit | Attending: Internal Medicine | Admitting: Internal Medicine

## 2023-03-15 ENCOUNTER — Encounter (HOSPITAL_COMMUNITY): Payer: Self-pay

## 2023-03-15 ENCOUNTER — Other Ambulatory Visit: Payer: Self-pay

## 2023-03-20 ENCOUNTER — Ambulatory Visit (HOSPITAL_COMMUNITY)
Admission: RE | Admit: 2023-03-20 | Discharge: 2023-03-20 | Disposition: A | Discharge status: Home / Self Care | Payer: Medicare PPO | Attending: Internal Medicine | Admitting: Internal Medicine

## 2023-03-20 ENCOUNTER — Encounter (HOSPITAL_COMMUNITY): Payer: Self-pay

## 2023-03-20 ENCOUNTER — Ambulatory Visit (HOSPITAL_COMMUNITY): Payer: Medicare PPO | Admitting: Certified Registered"

## 2023-03-20 ENCOUNTER — Encounter (HOSPITAL_COMMUNITY)
Admission: RE | Disposition: A | Discharge status: Home / Self Care | Payer: Self-pay | Source: Home / Self Care | Attending: Internal Medicine

## 2023-03-20 ENCOUNTER — Ambulatory Visit (HOSPITAL_BASED_OUTPATIENT_CLINIC_OR_DEPARTMENT_OTHER): Payer: Medicare PPO | Admitting: Certified Registered"

## 2023-03-20 DIAGNOSIS — I5042 Chronic combined systolic (congestive) and diastolic (congestive) heart failure: Secondary | ICD-10-CM | POA: Insufficient documentation

## 2023-03-20 DIAGNOSIS — I2510 Atherosclerotic heart disease of native coronary artery without angina pectoris: Secondary | ICD-10-CM | POA: Insufficient documentation

## 2023-03-20 DIAGNOSIS — Z86010 Personal history of colonic polyps: Secondary | ICD-10-CM

## 2023-03-20 DIAGNOSIS — K219 Gastro-esophageal reflux disease without esophagitis: Secondary | ICD-10-CM | POA: Insufficient documentation

## 2023-03-20 DIAGNOSIS — I252 Old myocardial infarction: Secondary | ICD-10-CM | POA: Insufficient documentation

## 2023-03-20 DIAGNOSIS — K2971 Gastritis, unspecified, with bleeding: Secondary | ICD-10-CM

## 2023-03-20 DIAGNOSIS — K2970 Gastritis, unspecified, without bleeding: Secondary | ICD-10-CM | POA: Insufficient documentation

## 2023-03-20 DIAGNOSIS — K2289 Other specified disease of esophagus: Secondary | ICD-10-CM | POA: Insufficient documentation

## 2023-03-20 DIAGNOSIS — I110 Hypertensive heart disease with heart failure: Secondary | ICD-10-CM | POA: Insufficient documentation

## 2023-03-20 DIAGNOSIS — Z87891 Personal history of nicotine dependence: Secondary | ICD-10-CM

## 2023-03-20 DIAGNOSIS — K635 Polyp of colon: Secondary | ICD-10-CM | POA: Diagnosis not present

## 2023-03-20 DIAGNOSIS — R1310 Dysphagia, unspecified: Secondary | ICD-10-CM

## 2023-03-20 DIAGNOSIS — Z09 Encounter for follow-up examination after completed treatment for conditions other than malignant neoplasm: Secondary | ICD-10-CM

## 2023-03-20 DIAGNOSIS — Z1211 Encounter for screening for malignant neoplasm of colon: Secondary | ICD-10-CM | POA: Insufficient documentation

## 2023-03-20 DIAGNOSIS — D123 Benign neoplasm of transverse colon: Secondary | ICD-10-CM | POA: Diagnosis not present

## 2023-03-20 DIAGNOSIS — D125 Benign neoplasm of sigmoid colon: Secondary | ICD-10-CM | POA: Diagnosis not present

## 2023-03-20 DIAGNOSIS — D127 Benign neoplasm of rectosigmoid junction: Secondary | ICD-10-CM | POA: Diagnosis not present

## 2023-03-20 DIAGNOSIS — K648 Other hemorrhoids: Secondary | ICD-10-CM | POA: Diagnosis not present

## 2023-03-20 HISTORY — PX: POLYPECTOMY: SHX149

## 2023-03-20 HISTORY — PX: COLONOSCOPY WITH PROPOFOL: SHX5780

## 2023-03-20 HISTORY — PX: ESOPHAGOGASTRODUODENOSCOPY (EGD) WITH PROPOFOL: SHX5813

## 2023-03-20 HISTORY — PX: ESOPHAGEAL BRUSHING: SHX6842

## 2023-03-20 HISTORY — PX: BIOPSY: SHX5522

## 2023-03-20 LAB — KOH PREP: KOH Prep: NONE SEEN

## 2023-03-20 SURGERY — COLONOSCOPY WITH PROPOFOL
Anesthesia: General

## 2023-03-20 MED ORDER — PHENYLEPHRINE 80 MCG/ML (10ML) SYRINGE FOR IV PUSH (FOR BLOOD PRESSURE SUPPORT)
PREFILLED_SYRINGE | INTRAVENOUS | Status: DC | PRN
  Administered 2023-03-20 (×2): 160 ug via INTRAVENOUS

## 2023-03-20 MED ORDER — PHENYLEPHRINE 80 MCG/ML (10ML) SYRINGE FOR IV PUSH (FOR BLOOD PRESSURE SUPPORT)
PREFILLED_SYRINGE | INTRAVENOUS | Status: AC
  Filled 2023-03-20: qty 10

## 2023-03-20 MED ORDER — LACTATED RINGERS IV SOLN: INTRAVENOUS | Status: DC

## 2023-03-20 MED ORDER — LIDOCAINE HCL (CARDIAC) PF 100 MG/5ML IV SOSY
PREFILLED_SYRINGE | INTRAVENOUS | Status: DC | PRN
  Administered 2023-03-20: 50 mg via INTRAVENOUS

## 2023-03-20 MED ORDER — ESMOLOL HCL 100 MG/10ML IV SOLN
INTRAVENOUS | Status: AC
  Filled 2023-03-20: qty 10

## 2023-03-20 MED ORDER — STERILE WATER FOR IRRIGATION IR SOLN
Status: DC | PRN
  Administered 2023-03-20: 60 mL

## 2023-03-20 MED ORDER — ESMOLOL HCL 100 MG/10ML IV SOLN
INTRAVENOUS | Status: DC | PRN
  Administered 2023-03-20: 10 mg via INTRAVENOUS

## 2023-03-20 MED ORDER — PROPOFOL 10 MG/ML IV BOLUS
INTRAVENOUS | Status: DC | PRN
  Administered 2023-03-20: 100 mg via INTRAVENOUS
  Administered 2023-03-20: 50 mg via INTRAVENOUS

## 2023-03-20 MED ORDER — PROPOFOL 500 MG/50ML IV EMUL
INTRAVENOUS | Status: DC | PRN
  Administered 2023-03-20: 150 ug/kg/min via INTRAVENOUS

## 2023-03-20 NOTE — Op Note (Signed)
[facility] Patient Name: Cameron Hoover Procedure Date: 03/20/2023 11:51 AM MRN: [id number] Date of Birth: 12-Nov-1955 Attending MD: Hennie Duos. Marletta Lor , [place], [id number] CSN: [id number] Age: 67 Admit Type: Outpatient Procedure:                Colonoscopy Indications:              Surveillance: Personal history of adenomatous                            polyps on last colonoscopy > 5 years ago Providers:                Hennie Duos. Marletta Lor, DO, Crystal Page, Durwin Glaze Tech, Technician Referring MD:              Medicines:                See the Anesthesia note for documentation of the                            administered medications Complications:            No immediate complications. Estimated Blood Loss:     Estimated blood loss was minimal. Procedure:                Pre-Anesthesia Assessment:                           - The anesthesia plan was to use monitored                            anesthesia care (MAC).                           After obtaining informed consent, the colonoscope                            was passed under direct vision. Throughout the                            procedure, the patient's blood pressure, pulse, and                            oxygen saturations were monitored continuously. The                            [id number]-HQ190L ([id number]) scope was introduced through                            the anus and advanced to the the cecum, identified                            by appendiceal orifice and ileocecal valve. The                            colonoscopy was performed  without difficulty. The                            patient tolerated the procedure well. The quality                            of the bowel preparation was evaluated using the                            BBPS [facility] Bowel Preparation Scale) with scores                            of: Right Colon = 2 (minor amount of residual                            staining,  small fragments of stool and/or opaque                            liquid, but mucosa seen well), Transverse Colon = 3                            (entire mucosa seen well with no residual staining,                            small fragments of stool or opaque liquid) and Left                            Colon = 3 (entire mucosa seen well with no residual                            staining, small fragments of stool or opaque                            liquid). The total BBPS score equals 8. The quality                            of the bowel preparation was good. Scope In: 12:47:51 PM Scope Out: 1:05:22 PM Scope Withdrawal Time: 0 hours 14 minutes 5 seconds  Total Procedure Duration: 0 hours 17 minutes 31 seconds  Findings:      Non-bleeding internal hemorrhoids were found during endoscopy.      Two sessile polyps were found in the transverse colon. The polyps were 4       to 6 mm in size. These polyps were removed with a cold snare. Resection       and retrieval were complete.      Two sessile polyps were found in the recto-sigmoid colon and sigmoid       colon. The polyps were 3 to 4 mm in size. These polyps were removed with       a cold snare. Resection and retrieval were complete.      The exam was otherwise without abnormality. Impression:               - Non-bleeding internal hemorrhoids.                           -  Two 4 to 6 mm polyps in the transverse colon,                            removed with a cold snare. Resected and retrieved.                           - Two 3 to 4 mm polyps at the recto-sigmoid colon                            and in the sigmoid colon, removed with a cold                            snare. Resected and retrieved.                           - The examination was otherwise normal. Moderate Sedation:      Per Anesthesia Care Recommendation:           - Patient has a contact number available for                            emergencies. The signs and symptoms  of potential                            delayed complications were discussed with the                            patient. Return to normal activities tomorrow.                            Written discharge instructions were provided to the                            patient.                           - Resume previous diet.                           - Continue present medications.                           - Await pathology results.                           - Repeat colonoscopy in 5 years for surveillance.                           - Return to GI clinic in 3 months. Procedure Code(s):        --- Professional ---                           [phone], Colonoscopy, flexible; with removal of  tumor(s), polyp(s), or other lesion(s) by snare                            technique Diagnosis Code(s):        --- Professional ---                           Z86.010, Personal history of colonic polyps                           D12.3, Benign neoplasm of transverse colon (hepatic                            flexure or splenic flexure)                           D12.7, Benign neoplasm of rectosigmoid junction                           D12.5, Benign neoplasm of sigmoid colon                           K64.8, Other hemorrhoids CPT copyright 2022 American Medical Association. All rights reserved. The codes documented in this report are preliminary and upon coder review may  be revised to meet current compliance requirements. Hennie Duos. Marletta Lor, DO Hennie Duos. Marletta Lor, DO 03/20/2023 1:08:06 PM This report has been signed electronically. Number of Addenda: 0

## 2023-03-20 NOTE — Discharge Instructions (Signed)
EGD Discharge instructions Please read the instructions outlined below and refer to this sheet in the next few weeks. These discharge instructions provide you with general information on caring for yourself after you leave the hospital. Your doctor may also give you specific instructions. While your treatment has been planned according to the most current medical practices available, unavoidable complications occasionally occur. If you have any problems or questions after discharge, please call your doctor. ACTIVITY You may resume your regular activity but move at a slower pace for the next 24 hours.  Take frequent rest periods for the next 24 hours.  Walking will help expel (get rid of) the air and reduce the bloated feeling in your abdomen.  No driving for 24 hours (because of the anesthesia (medicine) used during the test).  You may shower.  Do not sign any important legal documents or operate any machinery for 24 hours (because of the anesthesia used during the test).  NUTRITION Drink plenty of fluids.  You may resume your normal diet.  Begin with a light meal and progress to your normal diet.  Avoid alcoholic beverages for 24 hours or as instructed by your caregiver.  MEDICATIONS You may resume your normal medications unless your caregiver tells you otherwise.  WHAT YOU CAN EXPECT TODAY You may experience abdominal discomfort such as a feeling of fullness or "gas" pains.  FOLLOW-UP Your doctor will discuss the results of your test with you.  SEEK IMMEDIATE MEDICAL ATTENTION IF ANY OF THE FOLLOWING OCCUR: Excessive nausea (feeling sick to your stomach) and/or vomiting.  Severe abdominal pain and distention (swelling).  Trouble swallowing.  Temperature over 101 F (37.8 C).  Rectal bleeding or vomiting of blood.      Colonoscopy Discharge Instructions  Read the instructions outlined below and refer to this sheet in the next few weeks. These discharge instructions provide you  with general information on caring for yourself after you leave the hospital. Your doctor may also give you specific instructions. While your treatment has been planned according to the most current medical practices available, unavoidable complications occasionally occur.   ACTIVITY You may resume your regular activity, but move at a slower pace for the next 24 hours.  Take frequent rest periods for the next 24 hours.  Walking will help get rid of the air and reduce the bloated feeling in your belly (abdomen).  No driving for 24 hours (because of the medicine (anesthesia) used during the test).   Do not sign any important legal documents or operate any machinery for 24 hours (because of the anesthesia used during the test).  NUTRITION Drink plenty of fluids.  You may resume your normal diet as instructed by your doctor.  Begin with a light meal and progress to your normal diet. Heavy or fried foods are harder to digest and may make you feel sick to your stomach (nauseated).  Avoid alcoholic beverages for 24 hours or as instructed.  MEDICATIONS You may resume your normal medications unless your doctor tells you otherwise.  WHAT YOU CAN EXPECT TODAY Some feelings of bloating in the abdomen.  Passage of more gas than usual.  Spotting of blood in your stool or on the toilet paper.  IF YOU HAD POLYPS REMOVED DURING THE COLONOSCOPY: No aspirin products for 7 days or as instructed.  No alcohol for 7 days or as instructed.  Eat a soft diet for the next 24 hours.  FINDING OUT THE RESULTS OF YOUR TEST Not all test results  are available during your visit. If your test results are not back during the visit, make an appointment with your caregiver to find out the results. Do not assume everything is normal if you have not heard from your caregiver or the medical facility. It is important for you to follow up on all of your test results.  SEEK IMMEDIATE MEDICAL ATTENTION IF: You have more than a  spotting of blood in your stool.  Your belly is swollen (abdominal distention).  You are nauseated or vomiting.  You have a temperature over 101.  You have abdominal pain or discomfort that is severe or gets worse throughout the day.   Your EGD revealed mild amount inflammation in your stomach.  I took biopsies of this to rule out infection with a bacteria called H. pylori.  Also took samples of your esophagus.  Await pathology results, my office will contact you.  Your esophagus was wide open, I did not need to stretch it today.  Small bowel appeared normal.  Continue on pantoprazole.  Your colonoscopy revealed 4 polyp(s) which I removed successfully. Await pathology results, my office will contact you. I recommend repeating colonoscopy in 5 years for surveillance purposes.   Follow up with GI in 2-3 months  I hope you have a great rest of your week!  Hennie Duos. Marletta Lor, D.O. Gastroenterology and Hepatology [facility]

## 2023-03-20 NOTE — Transfer of Care (Signed)
Immediate Anesthesia Transfer of Care Note  Patient: Cameron Hoover  Procedure(s) Performed: COLONOSCOPY WITH PROPOFOL ESOPHAGOGASTRODUODENOSCOPY (EGD) WITH PROPOFOL BIOPSY ESOPHAGEAL BRUSHING POLYPECTOMY INTESTINAL  Patient Location: Short Stay  Anesthesia Type:General  Level of Consciousness: awake  Airway & Oxygen Therapy: Patient Spontanous Breathing  Post-op Assessment: Report given to RN and Post -op Vital signs reviewed and stable  Post vital signs: Reviewed and stable  Last Vitals:  Vitals Value Taken Time  BP    Temp    Pulse 60 03/20/23 1311  Resp 27 03/20/23 1311  SpO2 99 % 03/20/23 1311  Vitals shown include unfiled device data.  Last Pain:  Vitals:   03/20/23 1233  TempSrc:   PainSc: 0-No pain         Complications: No notable events documented.

## 2023-03-20 NOTE — Anesthesia Procedure Notes (Signed)
Date/Time: 03/20/2023 12:39 PM  Performed by: Julian Reil, CRNAPre-anesthesia Checklist: Patient identified, Emergency Drugs available, Suction available and Patient being monitored Patient Re-evaluated:Patient Re-evaluated prior to induction Oxygen Delivery Method: Nasal cannula Induction Type: IV induction Placement Confirmation: positive ETCO2

## 2023-03-20 NOTE — Op Note (Signed)
[facility] Patient Name: Cameron Hoover Procedure Date: 03/20/2023 11:51 AM MRN: [id number] Date of Birth: 1956-02-14 Attending MD: Hennie Duos. Marletta Lor , [place], [id number] CSN: [id number] Age: 67 Admit Type: Outpatient Procedure:                Upper GI endoscopy Indications:              Dysphagia, Heartburn Providers:                Hennie Duos. Marletta Lor, DO, Crystal Page, Durwin Glaze Tech, Technician Referring MD:              Medicines:                See the Anesthesia note for documentation of the                            administered medications Complications:            No immediate complications. Estimated Blood Loss:     Estimated blood loss was minimal. Procedure:                Pre-Anesthesia Assessment:                           - The anesthesia plan was to use monitored                            anesthesia care (MAC).                           After obtaining informed consent, the endoscope was                            passed under direct vision. Throughout the                            procedure, the patient's blood pressure, pulse, and                            oxygen saturations were monitored continuously. The                            [id number]-[id number] ([id number]) scope was introduced through the                            mouth, and advanced to the second part of duodenum.                            The upper GI endoscopy was accomplished without                            difficulty. The patient tolerated the procedure                            well. Scope In: 12:38:15 PM  Scope Out: 12:41:54 PM Total Procedure Duration: 0 hours 3 minutes 39 seconds  Findings:      There is no endoscopic evidence of stenosis or stricture in the entire       esophagus.      White nummular lesions were noted in the middle third of the esophagus.       Cells for cytology were obtained by brushing.      Patchy mild inflammation characterized by erythema was  found in the       gastric body and in the gastric antrum. Biopsies were taken with a cold       forceps for Helicobacter pylori testing.      The duodenal bulb, first portion of the duodenum and second portion of       the duodenum were normal. Impression:               - White nummular lesions in esophageal mucosa.                            Cells for cytology obtained.                           - Gastritis. Biopsied.                           - Normal duodenal bulb, first portion of the                            duodenum and second portion of the duodenum. Moderate Sedation:      Per Anesthesia Care Recommendation:           - Patient has a contact number available for                            emergencies. The signs and symptoms of potential                            delayed complications were discussed with the                            patient. Return to normal activities tomorrow.                            Written discharge instructions were provided to the                            patient.                           - Resume previous diet.                           - Continue present medications.                           - Await pathology results.                           -  Use Protonix (pantoprazole) 40 mg PO daily.                           - Return to GI clinic in 3 months. Procedure Code(s):        --- Professional ---                           [phone], Esophagogastroduodenoscopy, flexible,                            transoral; with biopsy, single or multiple Diagnosis Code(s):        --- Professional ---                           K22.89, Other specified disease of esophagus                           K29.70, Gastritis, unspecified, without bleeding                           R13.10, Dysphagia, unspecified                           R12, Heartburn CPT copyright 2022 American Medical Association. All rights reserved. The codes documented in this report are preliminary  and upon coder review may  be revised to meet current compliance requirements. Hennie Duos. Marletta Lor, DO Hennie Duos. Marletta Lor, DO 03/20/2023 12:44:14 PM This report has been signed electronically. Number of Addenda: 0
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
## 2023-03-20 NOTE — Anesthesia Preprocedure Evaluation (Signed)
Anesthesia Evaluation  Patient identified by MRN, date of birth, ID band Patient awake    Reviewed: Allergy & Precautions, H&P , NPO status , Patient's Chart, lab work & pertinent test results, reviewed documented beta blocker date and time   Airway Mallampati: II  TM Distance: >3 FB Neck ROM: full    Dental no notable dental hx.    Pulmonary neg pulmonary ROS, Patient abstained from smoking., former smoker   Pulmonary exam normal breath sounds clear to auscultation       Cardiovascular Exercise Tolerance: Good hypertension, + CAD, + Past MI, + Cardiac Stents and +CHF   Rhythm:regular Rate:Normal     Neuro/Psych CVA negative neurological ROS  negative psych ROS   GI/Hepatic negative GI ROS, Neg liver ROS, PUD,GERD  ,,  Endo/Other  negative endocrine ROS    Renal/GU negative Renal ROS  negative genitourinary   Musculoskeletal   Abdominal   Peds  Hematology negative hematology ROS (+) Blood dyscrasia, anemia   Anesthesia Other Findings   Reproductive/Obstetrics negative OB ROS                             Anesthesia Physical Anesthesia Plan  ASA: 3  Anesthesia Plan: General   Post-op Pain Management:    Induction:   PONV Risk Score and Plan: Propofol infusion  Airway Management Planned:   Additional Equipment:   Intra-op Plan:   Post-operative Plan:   Informed Consent: I have reviewed the patients History and Physical, chart, labs and discussed the procedure including the risks, benefits and alternatives for the proposed anesthesia with the patient or authorized representative who has indicated his/her understanding and acceptance.     Dental Advisory Given  Plan Discussed with: CRNA  Anesthesia Plan Comments:        Anesthesia Quick Evaluation

## 2023-03-21 LAB — SURGICAL PATHOLOGY

## 2023-03-21 NOTE — Anesthesia Postprocedure Evaluation (Signed)
Anesthesia Post Note  Patient: Cameron Hoover  Procedure(s) Performed: COLONOSCOPY WITH PROPOFOL ESOPHAGOGASTRODUODENOSCOPY (EGD) WITH PROPOFOL BIOPSY ESOPHAGEAL BRUSHING POLYPECTOMY INTESTINAL  Patient location during evaluation: Phase II Anesthesia Type: General Level of consciousness: awake Pain management: pain level controlled Vital Signs Assessment: post-procedure vital signs reviewed and stable Respiratory status: spontaneous breathing and respiratory function stable Cardiovascular status: blood pressure returned to baseline and stable Postop Assessment: no headache and no apparent nausea or vomiting Anesthetic complications: no Comments: Late entry   No notable events documented.   Last Vitals:  Vitals:   03/20/23 1126 03/20/23 1311  BP: (!) 128/55 (!) 112/46  Pulse: (!) 54 66  Resp: 20 (!) 21  Temp: 36.6 C (!) 36.4 C  SpO2: 98% 99%    Last Pain:  Vitals:   03/20/23 1311  TempSrc: Oral  PainSc: 0-No pain                 Windell Norfolk

## 2023-03-22 ENCOUNTER — Encounter (HOSPITAL_COMMUNITY): Payer: Self-pay | Admitting: Internal Medicine

## 2023-04-18 ENCOUNTER — Other Ambulatory Visit: Payer: Self-pay | Admitting: Gastroenterology

## 2023-04-18 ENCOUNTER — Telehealth: Payer: Self-pay | Admitting: *Deleted

## 2023-04-18 MED ORDER — PANTOPRAZOLE SODIUM 40 MG PO TBEC: 40.0000 mg | DELAYED_RELEASE_TABLET | Freq: Two times a day (BID) | ORAL | 3 refills | Status: DC

## 2023-04-18 NOTE — Telephone Encounter (Signed)
Prescription for pantoprazole needs to changed to twice a day.

## 2023-04-18 NOTE — Telephone Encounter (Signed)
Noted. Informed pt.  

## 2023-05-15 ENCOUNTER — Encounter: Payer: Self-pay | Admitting: Gastroenterology

## 2023-05-22 ENCOUNTER — Other Ambulatory Visit (HOSPITAL_COMMUNITY): Payer: Self-pay | Admitting: Adult Health

## 2023-05-22 DIAGNOSIS — F17210 Nicotine dependence, cigarettes, uncomplicated: Secondary | ICD-10-CM
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
## 2023-05-23 ENCOUNTER — Ambulatory Visit: Payer: Medicare PPO | Attending: Nurse Practitioner | Admitting: Nurse Practitioner

## 2023-05-23 ENCOUNTER — Encounter: Payer: Self-pay | Admitting: Nurse Practitioner

## 2023-05-23 VITALS — BP 112/62 | HR 63 | Ht 66.0 in | Wt 166.0 lb

## 2023-05-23 DIAGNOSIS — I351 Nonrheumatic aortic (valve) insufficiency: Secondary | ICD-10-CM

## 2023-05-23 DIAGNOSIS — E785 Hyperlipidemia, unspecified: Secondary | ICD-10-CM

## 2023-05-23 DIAGNOSIS — I2510 Atherosclerotic heart disease of native coronary artery without angina pectoris: Secondary | ICD-10-CM

## 2023-05-23 DIAGNOSIS — I5042 Chronic combined systolic (congestive) and diastolic (congestive) heart failure: Secondary | ICD-10-CM | POA: Diagnosis not present

## 2023-05-23 DIAGNOSIS — I10 Essential (primary) hypertension: Secondary | ICD-10-CM | POA: Diagnosis not present

## 2023-05-23 NOTE — Patient Instructions (Signed)
Medication Instructions:  Your physician recommends that you continue on your current medications as directed. Please refer to the Current Medication list given to you today. *If you need a refill on your cardiac medications before your next appointment, please call your pharmacy*   Lab Work: None ordered   Testing/Procedures: None ordered   Follow-Up: At [facility], you and your health needs are our priority.  As part of our continuing mission to provide you with exceptional heart care, we have created designated Provider Care Teams.  These Care Teams include your primary Cardiologist (physician) and Advanced Practice Providers (APPs -  Physician Assistants and Nurse Practitioners) who all work together to provide you with the care you need, when you need it.  We recommend signing up for the patient portal called "MyChart".  Sign up information is provided on this After Visit Summary.  MyChart is used to connect with patients for Virtual Visits (Telemedicine).  Patients are able to view lab/test results, encounter notes, upcoming appointments, etc.  Non-urgent messages can be sent to your provider as well.   To learn more about what you can do with MyChart, go to [email or web address].    Your next appointment:   6 month(s)  Provider:   Tessa Lerner, MD  Other Instructions [phone] Follow up with your primary care provider to discuss Wellbutrin and schedule your CT

## 2023-06-10 ENCOUNTER — Ambulatory Visit (HOSPITAL_COMMUNITY)
Admission: RE | Admit: 2023-06-10 | Discharge: 2023-06-10 | Disposition: A | Discharge status: Home / Self Care | Payer: Medicare PPO | Source: Ambulatory Visit | Attending: Adult Health | Admitting: Adult Health

## 2023-06-10 DIAGNOSIS — F17210 Nicotine dependence, cigarettes, uncomplicated: Secondary | ICD-10-CM
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
## 2023-08-15 ENCOUNTER — Ambulatory Visit: Payer: Medicare HMO | Admitting: Gastroenterology

## 2023-08-15 ENCOUNTER — Encounter: Payer: Self-pay | Admitting: Gastroenterology

## 2023-08-15 VITALS — BP 101/63 | HR 69 | Temp 97.6°F | Ht 66.0 in | Wt 166.0 lb

## 2023-08-15 DIAGNOSIS — R7989 Other specified abnormal findings of blood chemistry: Secondary | ICD-10-CM

## 2023-08-15 DIAGNOSIS — R1310 Dysphagia, unspecified: Secondary | ICD-10-CM

## 2023-08-15 DIAGNOSIS — R748 Abnormal levels of other serum enzymes: Secondary | ICD-10-CM | POA: Diagnosis not present

## 2023-08-15 DIAGNOSIS — K760 Fatty (change of) liver, not elsewhere classified: Secondary | ICD-10-CM

## 2023-08-15 DIAGNOSIS — F1090 Alcohol use, unspecified, uncomplicated: Secondary | ICD-10-CM

## 2023-08-15 DIAGNOSIS — K219 Gastro-esophageal reflux disease without esophagitis: Secondary | ICD-10-CM

## 2023-08-15 DIAGNOSIS — K5900 Constipation, unspecified: Secondary | ICD-10-CM

## 2023-08-15 DIAGNOSIS — R1319 Other dysphagia: Secondary | ICD-10-CM

## 2023-08-15 MED ORDER — LANSOPRAZOLE 30 MG PO CPDR: 30.0000 mg | DELAYED_RELEASE_CAPSULE | Freq: Two times a day (BID) | ORAL | 3 refills | Status: DC

## 2023-08-15 MED ORDER — SUCRALFATE 1 G PO TABS: 1.0000 g | ORAL_TABLET | Freq: Three times a day (TID) | ORAL | 0 refills | Status: DC

## 2023-08-15 NOTE — Patient Instructions (Signed)
 Please stop taking pantoprazole  and start taking lansoprazole  30 mg twice daily. I have also sent in Carafate  for you to take 1 g 4 times a day for 2 weeks.  Please let me know if you continue to have issues over the next 3-4 weeks  I have ordered some lab work for you to have completed.  You need to do this on empty stomach before eating your first meal of the day. Please have blood work completed at [facility].  We will call you with results once they have been received. Please allow 3-5 business days for review. 2 locations for [facility] in [place]:              1. [address], [place]              [address]. [address] [place]   Given your elevated liver enzymes in the past when she to try to follow Mediterranean diet.  You may be doing a lot of this already however this is just some information for you.  Please look over the TIF pamphlet I have provided for you today.  This is a procedure that can help treat acid reflux.  The workup does include a barium pill esophagram as well as manometry and if you would like to proceed with this workup please let me know and we can schedule these.  Follow up in 3 months.   It was a pleasure to see you today. I want to create trusting relationships with patients. If you receive a survey regarding your visit,  I greatly appreciate you taking time to fill this out on paper or through your MyChart. I value your feedback.  Charmaine Melia, MSN, FNP-BC, AGACNP-BC [facility]     What is the Mediterranean Diet? The Mediterranean Diet is a way of eating that emphasizes plant-based foods and healthy fats. You focus on overall eating patterns rather than following strict formulas or calculations.  In general, you'll eat: Lots of vegetables, fruit, beans, lentils and nuts. A good amount of whole grains, like whole-wheat bread and brown rice. Plenty of extra virgin olive oil (EVOO) as a source of healthy fat. A  good amount of fish, especially fish rich in omega-3 fatty acids. A moderate amount of natural cheese and yogurt. Little or no red meat, choosing poultry, fish or beans instead of red meat. Little or no sweets, sugary drinks or butter. A moderate amount of wine with meals (but if you don't already drink, don't start). This is how people ate in certain Mediterranean countries in the mid-20th century. Researchers have linked these eating patterns with a reduced risk of coronary artery disease (CAD). Today, healthcare providers recommend this eating plan if you have risk factors for heart disease or to support other aspects of your health.  A dietitian can help you modify your approach as needed based on your medical history, underlying conditions, allergies and preferences.    What are the benefits of the Mediterranean Diet? The mediterranean diet allows you to focus on overall eating patterns rather than following strict formulas or calculations.  The Mediterranean Diet has many benefits, including: Lowering your risk of cardiovascular disease, including a heart attack or stroke. Supporting a body weight that's healthy for you. Supporting healthy blood sugar levels, blood pressure and cholesterol. Lowering your risk of metabolic syndrome. Supporting a healthy balance of gut microbiota (bacteria and other microorganisms) in your digestive system. Lowering your risk for certain types  of cancer. Slowing the decline of brain function as you age. Helping you live longer.  The Mediterranean Diet has these benefits because it: Limits saturated fat and trans fat. You need some saturated fat, but only in small amounts. Eating too much saturated fat can raise your LDL (bad) cholesterol. A high LDL raises your risk of plaque buildup in your arteries (atherosclerosis). Trans fat has no health benefits. Both of these "unhealthy fats" can cause inflammation. Encourages healthy unsaturated fats, including  omega-3 fatty acids. Unsaturated fats promote healthy cholesterol levels, support brain health and combat inflammation. Plus, a diet high in unsaturated fats and low in saturated fat promotes healthy blood sugar levels. Limits sodium. Eating foods high in sodium can raise your blood pressure, putting you at a greater risk for a heart attack or stroke. Limits refined carbohydrates, including sugar. Foods high in refined carbs can cause your blood sugar to spike. Refined carbs also give you excess calories without much nutritional benefit. For example, such foods often have little or no fiber. Favors foods high in fiber and antioxidants. These nutrients help reduce inflammation throughout your body. Fiber also helps keep waste moving through your large intestine and helps maintain healthy blood sugar levels. Antioxidants protect you against cancer by warding off free radicals. The Mediterranean Diet includes many different nutrients that work together to help your body. There's no single food or ingredient responsible for the Mediterranean Diet's benefits. Instead, the diet is healthy for you because of the combination of nutrients it provides.  Think of a choir with many people singing. One voice alone might carry part of the tune, but you need all the voices to come together to achieve the full effect. Similarly, the Mediterranean Diet works by giving you an ideal blend of nutrients that harmonize to support your health.  Mediterranean Diet food list The Mediterranean Diet encourages you to eat plenty of some foods (like whole grains and vegetables) while limiting others. If you're planning a grocery store trip, you might wonder which foods to buy. Here are some examples of foods to eat often with the Mediterranean Diet.  A Mediterranean Diet food list includes a mix of veggies, tubers, fruits, grains, nuts, seeds and legumes.    Mediterranean Diet serving goals and sizes A fridge and pantry full of  nutritious foods are great for starters. But where do you go from there? How much of each food do you need? It's always best to talk to a dietitian to get advice tailored to your needs as you get started. The chart below offers some general guidance on serving goals and serving sizes, according to the type of food.    How do I create a Mediterranean Diet meal plan? It's important to consult with a primary care physician (PCP) or dietitian before making drastic changes to your diet or trying any new eating plan. They'll make sure your intended plan is best for you based on your individual needs. They may also share meal plans and recipes for you to try at home.  In general, when thinking about meals, you'll want to collect some go-to options and recipes for breakfasts, lunches, dinners and snacks. The more variety, the better. You don't want to get stuck in a rut or feel like you're restricted in which foods you can or should eat. Luckily, there's plenty of room for changing things up with the Mediterranean Diet.    What foods are not allowed on the Mediterranean Diet? The Mediterranean Diet doesn't  set hard and fast rules for what you're allowed or not allowed. Rather, it encourages you to eat more of certain foods and limit others. Here's what you should try to limit as much as possible: Any foods with added sugar, like bakery goods, ice cream and even some granola bars. Any drinks with added sugar, including fruit juices and sodas. Beer and liquor. Foods high in sodium or saturated fat. Refined carbohydrates, like white bread and white rice. Highly processed foods, like some cheeses. Fatty or processed meats. Additional Common Questions What is the Mediterranean Diet pyramid? The Mediterranean Diet pyramid is one way to visualize what foods you should eat and how often. Different organizations have created slightly different versions of this pyramid. All Mediterranean Diet pyramids encourage  you to eat mostly veggies, fruits, whole grains and extra virgin olive oil while limiting red meat and sweets.  How does lifestyle relate to the Mediterranean Diet?  To get the most from your eating plan, try to: Exercise regularly, ideally with others. Avoid smoking or using any tobacco products. Prepare and enjoy meals with family and friends. [place] more often than you eat out. Eat locally sourced foods whenever possible.  Can the Mediterranean Diet be vegetarian? Yes. If you prefer a vegetarian diet, you can easily modify the Mediterranean Diet to exclude meat and fish. In that case, you'd gain your protein solely from plant sources like nuts and beans. Talk to a dietitian to learn more.  Can the Mediterranean Diet be gluten-free? Yes. You can modify recipes to exclude gluten-based products. Talk to a dietitian for recipe ideas and support in making necessary changes.  Can I use regular olive oil instead of extra virgin olive oil? Regular olive oil is a good alternative to an oil that's high in saturated fat (like palm oil). However, to get the most benefits, opt for extra virgin olive oil.  A crucial fact to know before starting the Mediterranean Diet is that not all olive oils are the same. The Mediterranean Diet calls for extra virgin olive oil (EVOO), specifically. That's because it has a healthy fat ratio. This means EVOO contains more healthy fat (unsaturated) than unhealthy fat (saturated). Aside from its fat ratio, EVOO is healthy because it's high in antioxidants.  Antioxidants help protect your cells from damage, therefore protecting your heart and brain and reducing inflammation throughout your body. Because it's manufactured differently, regular olive oil doesn't contain as many of these antioxidants.  A note from [facility]: In a world with endless diet options, it can be hard to know which one is right for you. Research has proven the benefits of the Mediterranean Diet  for many people, especially those at risk for heart disease. Beyond protecting your heart, the Mediterranean Diet can help you prevent or manage many other conditions.

## 2023-08-30 LAB — HEPATIC FUNCTION PANEL
ALT: 19 [IU]/L (ref 0–44)
AST: 20 [IU]/L (ref 0–40)
Albumin: 4.3 g/dL (ref 3.9–4.9)
Alkaline Phosphatase: 135 [IU]/L — ABNORMAL HIGH (ref 44–121)
Bilirubin Total: 0.4 mg/dL (ref 0.0–1.2)
Bilirubin, Direct: 0.14 mg/dL (ref 0.00–0.40)
Total Protein: 7.6 g/dL (ref 6.0–8.5)

## 2023-08-30 LAB — GAMMA GT: GGT: 37 [IU]/L (ref 0–65)

## 2023-10-30 ENCOUNTER — Encounter: Payer: Self-pay | Admitting: Gastroenterology

## 2023-11-20 ENCOUNTER — Encounter: Payer: Self-pay | Admitting: Cardiology

## 2023-11-20 ENCOUNTER — Ambulatory Visit: Payer: Medicare PPO | Attending: Cardiology | Admitting: Cardiology

## 2023-11-20 VITALS — BP 128/62 | HR 63 | Resp 16 | Ht 66.0 in | Wt 161.6 lb

## 2023-11-20 DIAGNOSIS — I2510 Atherosclerotic heart disease of native coronary artery without angina pectoris: Secondary | ICD-10-CM | POA: Diagnosis not present

## 2023-11-20 DIAGNOSIS — I5032 Chronic diastolic (congestive) heart failure: Secondary | ICD-10-CM | POA: Diagnosis not present

## 2023-11-20 DIAGNOSIS — E782 Mixed hyperlipidemia: Secondary | ICD-10-CM

## 2023-11-20 DIAGNOSIS — I351 Nonrheumatic aortic (valve) insufficiency: Secondary | ICD-10-CM

## 2023-11-20 DIAGNOSIS — Z955 Presence of coronary angioplasty implant and graft: Secondary | ICD-10-CM

## 2023-11-20 DIAGNOSIS — I350 Nonrheumatic aortic (valve) stenosis: Secondary | ICD-10-CM

## 2023-11-20 NOTE — Patient Instructions (Addendum)
 Medication Instructions:  Your physician recommends that you continue on your current medications as directed. Please refer to the Current Medication list given to you today.  *If you need a refill on your cardiac medications before your next appointment, please call your pharmacy*  Lab Work: None ordered today. If you have labs (blood work) drawn today and your tests are completely normal, you will receive your results only by: MyChart Message (if you have MyChart) OR A paper copy in the mail If you have any lab test that is abnormal or we need to change your treatment, we will call you to review the results.  Testing/Procedures: Your physician has requested that you have an echocardiogram in May 2026. Echocardiography is a painless test that uses sound waves to create images of your heart. It provides your doctor with information about the size and shape of your heart and how well your heart's chambers and valves are working. This procedure takes approximately one hour. There are no restrictions for this procedure. Please do NOT wear cologne, perfume, aftershave, or lotions (deodorant is allowed). Please arrive 15 minutes prior to your appointment time.  Please note: We ask at that you not bring children with you during ultrasound (echo/ vascular) testing. Due to room size and safety concerns, children are not allowed in the ultrasound rooms during exams. Our front office staff cannot provide observation of children in our lobby area while testing is being conducted. An adult accompanying a patient to their appointment will only be allowed in the ultrasound room at the discretion of the ultrasound technician under special circumstances. We apologize for any inconvenience.   Follow-Up: At [facility], you and your health needs are our priority.  As part of our continuing mission to provide you with exceptional heart care, we have created designated Provider Care Teams.  These Care Teams  include your primary Cardiologist (physician) and Advanced Practice Providers (APPs -  Physician Assistants and Nurse Practitioners) who all work together to provide you with the care you need, when you need it.   Your next appointment:   June 2026  The format for your next appointment:   In Person  Provider:   Olinda Bertrand, [facility]  Other Instructions   1st Floor: - Lobby - Registration  - Pharmacy  - Lab - Cafe  2nd Floor: - PV Lab - Diagnostic Testing (echo, CT, nuclear med)  3rd Floor: - Vacant  4th Floor: - TCTS (cardiothoracic surgery) - AFib Clinic - Structural Heart Clinic - Vascular Surgery  - Vascular Ultrasound  5th Floor: - [facility]Care Cardiology (general and EP) - Clinical Pharmacy for coumadin, hypertension, lipid, weight-loss medications, and med management appointments    Valet parking services will be available as well.

## 2023-11-20 NOTE — Progress Notes (Signed)
 Cardiology Office Note:  .   Date:  11/20/2023  ID:  Cameron Hoover, DOB 19-Oct-1955, MRN [id number] PCP:  Brantley Caldwell, NP  Former Cardiology Providers: Dr. Nicholette Barley Dr. [facility] Providers Cardiologist:  Olinda Bertrand, DO , [facility] (established care 11/20/23) Electrophysiologist:  None  Click to update primary MD,subspecialty MD or APP then REFRESH:1}    Chief Complaint  Patient presents with   Coronary artery disease involving native coronary artery of   Follow-up    History of Present Illness: .   Cameron Hoover is a 68 y.o. African-American male whose past medical history and cardiovascular risk factors includes: CAD s/p DES to mLAD) in 2016, bradycardia (not on BB due to this), chronic HFpEF, UGIB 03/2016 gastritis/duodenitis, CVA in 2023, HTN AI/AS, cigarette smoking.  In 2016 patient presented to [facility] and ruled in for NSTEMI.  Heart catheterization at that time noted 80% disease in the mid LAD underwent DES and remainder of the coronary artery disease was treated medically.  She was started on GDMT but beta-blockers were held secondary to bradycardia.  She developed GI bleed and Brilinta  was discontinued but he remained on low-dose aspirin .  Patient had an echocardiogram in October 2023 which noted low normal LVEF, global hypokinesis, grade 1 diastolic dysfunction, and mild aortic stenosis/regurgitation.  Patient was last seen in the office in October 2024 and presents today for 15-month follow-up visit.  Since last office visit patient denies any anginal chest pain or heart failure symptoms.  No hospitalizations or urgent care visits for cardiovascular reasons.  He has been compliant with his medical therapy.  No significant weight gain.  Physical endurance remains stable.    Review of Systems: .   Review of Systems  Cardiovascular:  Negative for chest pain, claudication, irregular heartbeat, leg swelling, near-syncope, orthopnea, palpitations,  paroxysmal nocturnal dyspnea and syncope.  Respiratory:  Negative for shortness of breath.   Hematologic/Lymphatic: Negative for bleeding problem.    Studies Reviewed:   EKG: EKG Interpretation Date/Time:  Monday November 20 2023 16:40:25 EDT Ventricular Rate:  55 PR Interval:  200 QRS Duration:  102 QT Interval:  448 QTC Calculation: 428 R Axis:   96  Text Interpretation: Sinus bradycardia Rightward axis Left ventricular hypertrophy with repolarization abnormality ( Sokolow-Lyon ) When compared with ECG of 23-May-2023 07:59, No significant change since last tracing Confirmed by Olinda Bertrand [phone]) on 11/20/2023 5:04:10 PM   Echocardiogram: 05/2015: LVEF 40-45%   05/2022 1. Left ventricular ejection fraction, by estimation, is 50%. The left ventricle has mildly decreased function. The left ventricle demonstrates global hypokinesis. Left ventricular diastolic parameters are consistent with Grade I diastolic dysfunction (impaired relaxation). 2. Right ventricular systolic function is normal. The right ventricular size is normal. Tricuspid regurgitation signal is inadequate for assessing PA pressure. 3. The mitral valve is normal in structure. No evidence of mitral valve regurgitation. No evidence of mitral stenosis. 4. The aortic valve is tricuspid. There is moderate calcification of the aortic valve. There is moderate thickening of the aortic valve. Aortic valve regurgitation is mild. Mild aortic valve stenosis. Aortic regurgitation PHT measures 460 msec but in the setting impaired relaxation. Aortic valve mean gradient measures 10.0 mmHg.  CARDIAC CATHETERIZATION 05/28/2015  Conclusion  LM lesion, 25% stenosed.  Prox RCA lesion, 10% stenosed.  Mid LAD lesion, 80% stenosed. Post intervention, there is a 0% residual stenosis.  There is mild left ventricular systolic dysfunction.   Mild LV dysfunction with an ejection fraction  of 50% with suggestion of a small region of mid  anterolateral and mid inferior hypokinesis.   Coronary obstructive disease with mild 25% stenosis in the distal left main, 80% eccentric stenosis in the proximal to mid LAD, normal ramus intermediate, normal left circumflex, and dominant RCA with smooth 10% proximal narrowing.   Successful PCI to the LAD with ultimate insertion of a 3.026 mm Resolute DES stent postdilated to 3.26 mm with the 80% eccentric stenosis being reduced to 0%.  There was TIMI-3 flow and no evidence for dissection.  RADIOLOGY: N/A  Risk Assessment/Calculations:   NA   Labs:       Latest Ref Rng & Units 11/15/2022    8:43 AM 05/31/2022   12:33 AM 05/30/2022    4:50 PM  CBC  WBC 3.4 - 10.8 x10E3/uL 6.6  6.3  5.2   Hemoglobin 13.0 - 17.7 g/dL 16.1  09.6  04.5   Hematocrit 37.5 - 51.0 % 45.7  44.4  51.2   Platelets 150 - 450 x10E3/uL 296  340  332        Latest Ref Rng & Units 11/15/2022    8:43 AM 05/31/2022   12:33 AM 05/30/2022    4:50 PM  BMP  Glucose 70 - 99 mg/dL 78  409    BUN 8 - 27 mg/dL 18  14    Creatinine 8.11 - 1.27 mg/dL 9.14  7.82  9.56   BUN/Creat Ratio 10 - 24 19     Sodium 134 - 144 mmol/L 137  135    Potassium 3.5 - 5.2 mmol/L 4.4  4.6    Chloride 96 - 106 mmol/L 103  104    CO2 20 - 29 mmol/L 19  23    Calcium  8.6 - 10.2 mg/dL 9.6  9.1        Latest Ref Rng & Units 08/29/2023    9:07 AM 03/01/2023   12:02 PM 02/09/2023   10:45 AM  CMP  Total Protein 6.0 - 8.5 g/dL 7.6  7.7  8.2   Total Bilirubin 0.0 - 1.2 mg/dL 0.4  0.3  0.3   Alkaline Phos 44 - 121 IU/L 135  146  159   AST 0 - 40 IU/L 20  24  32   ALT 0 - 44 IU/L 19  26  45     Lab Results  Component Value Date   CHOL 140 03/01/2023   HDL 27 (L) 05/31/2022   LDLCALC 65 05/31/2022   TRIG 131 03/01/2023   CHOLHDL 4.6 05/31/2022   No results for input(s): "LIPOA" in the last 8760 hours. No components found for: "NTPROBNP" No results for input(s): "PROBNP" in the last 8760 hours. No results for input(s): "TSH" in the  last 8760 hours.  Physical Exam:    Today's Vitals   11/20/23 1636  BP: 128/62  Pulse: 63  Resp: 16  SpO2: 98%  Weight: 161 lb 9.6 oz (73.3 kg)  Height: 5\' 6"  (1.676 m)   Body mass index is 26.08 kg/m. Wt Readings from Last 3 Encounters:  11/20/23 161 lb 9.6 oz (73.3 kg)  08/15/23 166 lb (75.3 kg)  05/23/23 166 lb (75.3 kg)    Physical Exam  Constitutional: No distress.  hemodynamically stable  Neck: No JVD present.  Cardiovascular: Normal rate, regular rhythm, S1 normal and S2 normal. Exam reveals no gallop, no S3 and no S4.  Murmur heard. Systolic murmur is present with a grade of 3/6 at the upper  right sternal border. Pulmonary/Chest: Effort normal and breath sounds normal. No stridor. He has no wheezes. He has no rales.  Musculoskeletal:        General: No edema.     Cervical back: Neck supple.  Skin: Skin is warm.     Impression & Recommendation(s):  Impression:   ICD-10-CM   1. Coronary artery disease involving native coronary artery of native heart without angina pectoris  I25.10 EKG 12-Lead    2. Presence of stent in LAD coronary artery  Z95.5     3. Chronic heart failure with preserved ejection fraction (HFpEF) (HCC)  I50.32     4. Mixed hyperlipidemia  E78.2     5. Aortic valve insufficiency, etiology of cardiac valve disease unspecified  I35.1 ECHOCARDIOGRAM COMPLETE    6. Nonrheumatic aortic valve stenosis  I35.0 ECHOCARDIOGRAM COMPLETE       Recommendation(s):  Coronary artery disease involving native coronary artery of native heart without angina pectoris Presence of stent in LAD coronary artery Denies anginal chest pain or heart failure symptoms. EKG demonstrates sinus bradycardia with LVH Currently on Plavix  75 mg p.o. daily, started by neurologist status post stroke Continue atorvastatin  80 mg p.o. daily Continue Benicar 10 mg p.o. daily Reemphasized the importance of secondary prevention with focus on improving her modifiable  cardiovascular risk factors such as glycemic control, lipid management, blood pressure control, complete smoking cessation  Chronic heart failure with preserved ejection fraction (HFpEF) (HCC) October 2016: LVEF 40-45% with regional wall motion abnormalities October 2023: LVEF 50%, grade 1 diastolic dysfunction, mild AR, mild AS Currently on Benicar 10 mg p.o. daily. Discussed uptitration of medical therapy; however, patient states that he is overall doing well and would like to hold off on up titration of GDMT.  Monitor for now  Mixed hyperlipidemia Continue atorvastatin  80 mg p.o. daily. Will have fasting lipids checked with PCP. Does not endorse myalgias  Aortic valve insufficiency, etiology of cardiac valve disease unspecified Nonrheumatic aortic valve stenosis Clinically asymptomatic. We will repeat an echocardiogram in 3-year follow-up in May 2026   Orders Placed:  Orders Placed This Encounter  Procedures   EKG 12-Lead   ECHOCARDIOGRAM COMPLETE    Standing Status:   Future    Expected Date:   11/29/2024    Where should this test be performed:   [facility])    Does the patient weigh less than or greater than 250 lbs?:   Patient weighs less than 250 lbs    Perflutren DEFINITY (image enhancing agent) should be administered unless hypersensitivity or allergy exist:   Administer Perflutren    Reason for exam-Echo:   Other-Full Diagnosis List    Full ICD-10/Reason for Exam:   Aortic regurgitation [295621]    Full ICD-10/Reason for Exam:   Aortic stenosis [308657]     Final Medication List:   No orders of the defined types were placed in this encounter.   Medications Discontinued During This Encounter  Medication Reason   buPROPion (WELLBUTRIN SR) 150 MG 12 hr tablet Patient Preference   sucralfate  (CARAFATE ) 1 g tablet Patient Preference     Current Outpatient Medications:    atorvastatin  (LIPITOR ) 80 MG tablet, Take 1 tablet (80 mg total) by mouth  daily. 3rd request. Please schedule office visit before any future refills. (Patient taking differently: Take 80 mg by mouth daily. Patient crushes tabs so he can swallow), Disp: 15 tablet, Rfl: 0   clopidogrel  (PLAVIX ) 75 MG tablet, Take 1 tablet (75  mg total) by mouth daily., Disp: 90 tablet, Rfl: 3   gabapentin (NEURONTIN) 300 MG capsule, Take 300 mg by mouth 3 (three) times daily., Disp: , Rfl:    HYDROcodone-acetaminophen  (NORCO/VICODIN) 5-325 MG tablet, Take 1 tablet by mouth every 12 (twelve) hours., Disp: , Rfl:    lansoprazole  (PREVACID ) 30 MG capsule, Take 1 capsule (30 mg total) by mouth 2 (two) times daily before a meal., Disp: 60 capsule, Rfl: 3   olmesartan (BENICAR) 5 MG tablet, Take 10 mg by mouth daily., Disp: , Rfl:   Consent:   NA  Disposition:   June 2026 sooner if needed.  His questions and concerns were addressed to his satisfaction. He voices understanding of the recommendations provided during this encounter.    Signed, Olinda Bertrand, DO, [facility]  [facility]  [address] [place], [place] [address] 11/20/2023 6:40 PM

## 2023-12-10 ENCOUNTER — Other Ambulatory Visit: Payer: Self-pay | Admitting: Gastroenterology

## 2023-12-20 ENCOUNTER — Telehealth: Payer: Self-pay | Admitting: *Deleted

## 2023-12-20 ENCOUNTER — Other Ambulatory Visit: Payer: Self-pay | Admitting: Gastroenterology

## 2023-12-20 MED ORDER — LANSOPRAZOLE 30 MG PO CPDR: 30.0000 mg | DELAYED_RELEASE_CAPSULE | Freq: Two times a day (BID) | ORAL | 5 refills | Status: DC

## 2023-12-20 NOTE — Telephone Encounter (Signed)
 Pt called and states he needs a refill on Lansoprazole . Pt last OV 08/15/2023
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
## 2024-01-10 ENCOUNTER — Encounter: Payer: Self-pay | Admitting: Gastroenterology

## 2024-01-10 ENCOUNTER — Telehealth: Payer: Self-pay | Admitting: *Deleted

## 2024-01-10 ENCOUNTER — Ambulatory Visit (INDEPENDENT_AMBULATORY_CARE_PROVIDER_SITE_OTHER): Admitting: Gastroenterology

## 2024-01-10 ENCOUNTER — Other Ambulatory Visit: Payer: Self-pay | Admitting: *Deleted

## 2024-01-10 VITALS — BP 121/67 | HR 74 | Temp 97.7°F | Ht 66.0 in | Wt 161.6 lb

## 2024-01-10 DIAGNOSIS — K5900 Constipation, unspecified: Secondary | ICD-10-CM | POA: Diagnosis not present

## 2024-01-10 DIAGNOSIS — F1091 Alcohol use, unspecified, in remission: Secondary | ICD-10-CM

## 2024-01-10 DIAGNOSIS — K219 Gastro-esophageal reflux disease without esophagitis: Secondary | ICD-10-CM

## 2024-01-10 DIAGNOSIS — R1319 Other dysphagia: Secondary | ICD-10-CM

## 2024-01-10 DIAGNOSIS — R1310 Dysphagia, unspecified: Secondary | ICD-10-CM

## 2024-01-10 DIAGNOSIS — R7989 Other specified abnormal findings of blood chemistry: Secondary | ICD-10-CM

## 2024-01-10 DIAGNOSIS — Z8719 Personal history of other diseases of the digestive system: Secondary | ICD-10-CM

## 2024-01-10 DIAGNOSIS — Z87891 Personal history of nicotine dependence: Secondary | ICD-10-CM

## 2024-01-10 DIAGNOSIS — R748 Abnormal levels of other serum enzymes: Secondary | ICD-10-CM

## 2024-01-10 NOTE — Telephone Encounter (Signed)
 LMOVM to return call  BPE scheduled for Wednesday 01/17/24, arrive at 8:45 am to check in, NPO starting at 6 am

## 2024-01-10 NOTE — Patient Instructions (Addendum)
 We will get you scheduled for a special swallowing x ray in the near future.  Pending these results we may be able to order a manometry to further assess your esophageal muscles to see if you are a candidate for the TIF procedure.  Please look over the pamphlet provided to you today.  Continue lansoprazole  30 mg twice daily and Tums as needed for breakthrough.  Please let me symptoms worsen as we could do a repeat course of sucralfate  (Carafate ) to help coat your esophagus and stomach.  Follow a GERD diet:  Avoid fried, fatty, greasy, spicy, citrus foods. Avoid caffeine and carbonated beverages. Avoid chocolate. Try eating 4-6 small meals a day rather than 3 large meals. Do not eat within 3 hours of laying down. Prop head of bed up on wood or bricks to create a 6 inch incline.  Continue to try to reduce tobacco and alcohol  use as this is likely contributing to ongoing inflammation in your stomach which is likely why you continue to have some reflux issues.  Your prior labs showed an elevated alkaline phosphatase which could be secondary to the enzyme being secreted by your bones and/or your intestines and not just your liver.  Decreasing your alcohol  intake with goal of cessation can overall improve your liver health.  We will repeat your liver enzymes in about 6 months.  It was a pleasure to see you today. I want to create trusting relationships with patients. If you receive a survey regarding your visit,  I greatly appreciate you taking time to fill this out on paper or through your MyChart. I value your feedback.  Julian Obey, MSN, FNP-BC, AGACNP-BC [facility]

## 2024-01-10 NOTE — Telephone Encounter (Signed)
 Pt informed of BPE appt date, time, location and instructions. Verbalized understanding

## 2024-01-17 ENCOUNTER — Ambulatory Visit (HOSPITAL_COMMUNITY)
Admission: RE | Admit: 2024-01-17 | Discharge: 2024-01-17 | Disposition: A | Discharge status: Home / Self Care | Source: Ambulatory Visit | Attending: Gastroenterology | Admitting: Gastroenterology

## 2024-01-17 DIAGNOSIS — R1319 Other dysphagia: Secondary | ICD-10-CM | POA: Insufficient documentation

## 2024-01-17 DIAGNOSIS — K219 Gastro-esophageal reflux disease without esophagitis: Secondary | ICD-10-CM | POA: Diagnosis present

## 2024-01-19 ENCOUNTER — Ambulatory Visit: Payer: Self-pay | Admitting: Gastroenterology

## 2024-01-31 ENCOUNTER — Telehealth: Payer: Self-pay | Admitting: *Deleted

## 2024-01-31 NOTE — Telephone Encounter (Signed)
 Pt called and states that he would like to try Voquezna, before he tries another procedure. Please advise.

## 2024-01-31 NOTE — Telephone Encounter (Signed)
 LMOM for pt to call office. Left samples at front desk.

## 2024-02-05 NOTE — Telephone Encounter (Signed)
Spoke to pt, informed him of recommendations. Pt voiced understanding.

## 2024-06-25 ENCOUNTER — Encounter: Payer: Self-pay | Admitting: Gastroenterology

## 2024-06-28 ENCOUNTER — Other Ambulatory Visit: Payer: Self-pay | Admitting: Gastroenterology

## 2024-07-18 ENCOUNTER — Encounter (HOSPITAL_COMMUNITY): Payer: Self-pay

## 2024-07-18 ENCOUNTER — Other Ambulatory Visit: Payer: Self-pay

## 2024-07-18 ENCOUNTER — Emergency Department (HOSPITAL_COMMUNITY)

## 2024-07-18 ENCOUNTER — Observation Stay (HOSPITAL_COMMUNITY)
Admission: EM | Admit: 2024-07-18 | Discharge: 2024-07-23 | Disposition: A | Discharge status: Home / Self Care | Attending: Family Medicine | Admitting: Family Medicine

## 2024-07-18 DIAGNOSIS — I255 Ischemic cardiomyopathy: Secondary | ICD-10-CM | POA: Insufficient documentation

## 2024-07-18 DIAGNOSIS — Z87891 Personal history of nicotine dependence: Secondary | ICD-10-CM | POA: Diagnosis not present

## 2024-07-18 DIAGNOSIS — I110 Hypertensive heart disease with heart failure: Secondary | ICD-10-CM | POA: Diagnosis not present

## 2024-07-18 DIAGNOSIS — R7989 Other specified abnormal findings of blood chemistry: Secondary | ICD-10-CM

## 2024-07-18 DIAGNOSIS — I639 Cerebral infarction, unspecified: Secondary | ICD-10-CM | POA: Diagnosis present

## 2024-07-18 DIAGNOSIS — R079 Chest pain, unspecified: Principal | ICD-10-CM

## 2024-07-18 DIAGNOSIS — I5042 Chronic combined systolic (congestive) and diastolic (congestive) heart failure: Secondary | ICD-10-CM | POA: Diagnosis not present

## 2024-07-18 DIAGNOSIS — R9389 Abnormal findings on diagnostic imaging of other specified body structures: Secondary | ICD-10-CM

## 2024-07-18 DIAGNOSIS — I10 Essential (primary) hypertension: Secondary | ICD-10-CM | POA: Diagnosis present

## 2024-07-18 DIAGNOSIS — I25118 Atherosclerotic heart disease of native coronary artery with other forms of angina pectoris: Secondary | ICD-10-CM | POA: Diagnosis present

## 2024-07-18 DIAGNOSIS — K219 Gastro-esophageal reflux disease without esophagitis: Secondary | ICD-10-CM | POA: Insufficient documentation

## 2024-07-18 DIAGNOSIS — D649 Anemia, unspecified: Secondary | ICD-10-CM | POA: Insufficient documentation

## 2024-07-18 DIAGNOSIS — D509 Iron deficiency anemia, unspecified: Secondary | ICD-10-CM

## 2024-07-18 DIAGNOSIS — Z8673 Personal history of transient ischemic attack (TIA), and cerebral infarction without residual deficits: Secondary | ICD-10-CM | POA: Diagnosis not present

## 2024-07-18 DIAGNOSIS — R0609 Other forms of dyspnea: Secondary | ICD-10-CM

## 2024-07-18 DIAGNOSIS — I200 Unstable angina: Secondary | ICD-10-CM | POA: Diagnosis present

## 2024-07-18 DIAGNOSIS — I25110 Atherosclerotic heart disease of native coronary artery with unstable angina pectoris: Secondary | ICD-10-CM | POA: Diagnosis not present

## 2024-07-18 LAB — CBC WITH DIFFERENTIAL/PLATELET
Abs Immature Granulocytes: 0.06 K/uL (ref 0.00–0.07)
Basophils Absolute: 0 K/uL (ref 0.0–0.1)
Basophils Relative: 0 %
Eosinophils Absolute: 0.1 K/uL (ref 0.0–0.5)
Eosinophils Relative: 1 %
HCT: 35.9 % — ABNORMAL LOW (ref 39.0–52.0)
Hemoglobin: 11 g/dL — ABNORMAL LOW (ref 13.0–17.0)
Immature Granulocytes: 1 %
Lymphocytes Relative: 13 %
Lymphs Abs: 1.4 K/uL (ref 0.7–4.0)
MCH: 25.1 pg — ABNORMAL LOW (ref 26.0–34.0)
MCHC: 30.6 g/dL (ref 30.0–36.0)
MCV: 82 fL (ref 80.0–100.0)
Monocytes Absolute: 0.6 K/uL (ref 0.1–1.0)
Monocytes Relative: 6 %
Neutro Abs: 8.3 K/uL — ABNORMAL HIGH (ref 1.7–7.7)
Neutrophils Relative %: 79 %
Platelets: 355 K/uL (ref 150–400)
RBC: 4.38 MIL/uL (ref 4.22–5.81)
RDW: 15.7 % — ABNORMAL HIGH (ref 11.5–15.5)
WBC: 10.5 K/uL (ref 4.0–10.5)
nRBC: 0 % (ref 0.0–0.2)

## 2024-07-18 LAB — BASIC METABOLIC PANEL WITH GFR
Anion gap: 9 (ref 5–15)
BUN: 21 mg/dL (ref 8–23)
CO2: 25 mmol/L (ref 22–32)
Calcium: 9.3 mg/dL (ref 8.9–10.3)
Chloride: 104 mmol/L (ref 98–111)
Creatinine, Ser: 1 mg/dL (ref 0.61–1.24)
GFR, Estimated: >60 mL/min (ref >60)
Glucose, Bld: 92 mg/dL (ref 70–99)
Potassium: 4.2 mmol/L (ref 3.5–5.1)
Sodium: 137 mmol/L (ref 135–145)

## 2024-07-18 LAB — TROPONIN T, HIGH SENSITIVITY: Troponin T High Sensitivity: 36 ng/L — ABNORMAL HIGH (ref 0–19)

## 2024-07-18 MED ORDER — ASPIRIN 81 MG PO CHEW
324.0000 mg | CHEWABLE_TABLET | Freq: Once | ORAL | Status: AC
  Administered 2024-07-18: 324 mg via ORAL
  Filled 2024-07-18: qty 4

## 2024-07-18 NOTE — ED Triage Notes (Signed)
 Pt complaining of shortness of breath for a month. Tonight he said he could not catch his breath. [place] he has had congestion but thinks the biggest problem is his asthma

## 2024-07-18 NOTE — ED Provider Triage Note (Signed)
 Emergency Medicine Provider Triage Evaluation Note  Cameron Hoover , a 68 y.o. male  was evaluated in triage.  Pt complains of chest pain, shortness of breath, cough.  Patient reports he has had a MI in the past and reports tonight he felt like he was having a heart attack but pain has since resolved from initial onset.  Patient was to be evaluated for cardiac etiology or pneumonia.  Review of Systems  Positive: Chest pain, shortness of breath, cough Negative: Headache, dizziness, syncope  Physical Exam  BP (!) 137/54   Pulse 78   Temp 98 F (36.7 C)   Resp 20   SpO2 91%  Gen:   Awake, no distress   Resp:  Normal effort, no obvious rhonchi noted on initial exam MSK:   Moves extremities without difficulty  Other:    Medical Decision Making  Medically screening exam initiated at 9:32 PM.  Appropriate orders placed.  [name] was informed that the remainder of the evaluation will be completed by another provider, this initial triage assessment does not replace that evaluation, and the importance of remaining in the ED until their evaluation is complete.     Myriam Fonda RAMAN, [place] 07/18/24 2213

## 2024-07-19 ENCOUNTER — Observation Stay (HOSPITAL_COMMUNITY)

## 2024-07-19 ENCOUNTER — Encounter (HOSPITAL_COMMUNITY): Payer: Self-pay | Admitting: Internal Medicine

## 2024-07-19 DIAGNOSIS — R079 Chest pain, unspecified: Secondary | ICD-10-CM | POA: Diagnosis not present

## 2024-07-19 DIAGNOSIS — D509 Iron deficiency anemia, unspecified: Secondary | ICD-10-CM | POA: Diagnosis not present

## 2024-07-19 DIAGNOSIS — I25118 Atherosclerotic heart disease of native coronary artery with other forms of angina pectoris: Secondary | ICD-10-CM

## 2024-07-19 DIAGNOSIS — K219 Gastro-esophageal reflux disease without esophagitis: Secondary | ICD-10-CM

## 2024-07-19 DIAGNOSIS — I200 Unstable angina: Secondary | ICD-10-CM | POA: Diagnosis present

## 2024-07-19 DIAGNOSIS — R9389 Abnormal findings on diagnostic imaging of other specified body structures: Secondary | ICD-10-CM

## 2024-07-19 DIAGNOSIS — I10 Essential (primary) hypertension: Secondary | ICD-10-CM

## 2024-07-19 DIAGNOSIS — R0609 Other forms of dyspnea: Secondary | ICD-10-CM

## 2024-07-19 LAB — PROCALCITONIN: Procalcitonin: <0.1 ng/mL

## 2024-07-19 LAB — CBC
HCT: 34.2 % — ABNORMAL LOW (ref 39.0–52.0)
Hemoglobin: 10.5 g/dL — ABNORMAL LOW (ref 13.0–17.0)
MCH: 25 pg — ABNORMAL LOW (ref 26.0–34.0)
MCHC: 30.7 g/dL (ref 30.0–36.0)
MCV: 81.4 fL (ref 80.0–100.0)
Platelets: 337 K/uL (ref 150–400)
RBC: 4.2 MIL/uL — ABNORMAL LOW (ref 4.22–5.81)
RDW: 15.8 % — ABNORMAL HIGH (ref 11.5–15.5)
WBC: 7.2 K/uL (ref 4.0–10.5)
nRBC: 0 % (ref 0.0–0.2)

## 2024-07-19 LAB — IRON AND TIBC
Iron: 28 ug/dL — ABNORMAL LOW (ref 45–182)
Saturation Ratios: 7 % — ABNORMAL LOW (ref 17.9–39.5)
TIBC: 381 ug/dL (ref 250–450)
UIBC: 353 ug/dL

## 2024-07-19 LAB — HEPATIC FUNCTION PANEL
ALT: 13 U/L (ref 0–44)
AST: 20 U/L (ref 15–41)
Albumin: 4 g/dL (ref 3.5–5.0)
Alkaline Phosphatase: 132 U/L — ABNORMAL HIGH (ref 38–126)
Bilirubin, Direct: 0.2 mg/dL (ref 0.0–0.2)
Indirect Bilirubin: 0.3 mg/dL (ref 0.3–0.9)
Total Bilirubin: 0.5 mg/dL (ref 0.0–1.2)
Total Protein: 7.2 g/dL (ref 6.5–8.1)

## 2024-07-19 LAB — FERRITIN: Ferritin: 34 ng/mL (ref 24–336)

## 2024-07-19 LAB — BASIC METABOLIC PANEL WITH GFR
Anion gap: 8 (ref 5–15)
BUN: 18 mg/dL (ref 8–23)
CO2: 24 mmol/L (ref 22–32)
Calcium: 9.2 mg/dL (ref 8.9–10.3)
Chloride: 104 mmol/L (ref 98–111)
Creatinine, Ser: 0.91 mg/dL (ref 0.61–1.24)
GFR, Estimated: >60 mL/min
Glucose, Bld: 92 mg/dL (ref 70–99)
Potassium: 4 mmol/L (ref 3.5–5.1)
Sodium: 136 mmol/L (ref 135–145)

## 2024-07-19 LAB — FOLATE: Folate: 6.6 ng/mL

## 2024-07-19 LAB — TSH: TSH: 1.04 u[IU]/mL (ref 0.350–4.500)

## 2024-07-19 LAB — HIV ANTIBODY (ROUTINE TESTING W REFLEX): HIV Screen 4th Generation wRfx: NONREACTIVE

## 2024-07-19 LAB — RETICULOCYTES
Immature Retic Fract: 22.6 % — ABNORMAL HIGH (ref 2.3–15.9)
RBC.: 4.15 MIL/uL — ABNORMAL LOW (ref 4.22–5.81)
Retic Count, Absolute: 54.8 K/uL (ref 19.0–186.0)
Retic Ct Pct: 1.3 % (ref 0.4–3.1)

## 2024-07-19 LAB — PRO BRAIN NATRIURETIC PEPTIDE: Pro Brain Natriuretic Peptide: 150 pg/mL

## 2024-07-19 LAB — TROPONIN T, HIGH SENSITIVITY
Troponin T High Sensitivity: 29 ng/L — ABNORMAL HIGH (ref 0–19)
Troponin T High Sensitivity: 31 ng/L — ABNORMAL HIGH (ref 0–19)

## 2024-07-19 LAB — VITAMIN B12: Vitamin B-12: 417 pg/mL (ref 180–914)

## 2024-07-19 LAB — D-DIMER, QUANTITATIVE: D-Dimer, Quant: 0.46 ug{FEU}/mL (ref 0.00–0.50)

## 2024-07-19 LAB — MRSA NEXT GEN BY PCR, NASAL: MRSA by PCR Next Gen: NOT DETECTED

## 2024-07-19 MED ORDER — HYDROCODONE-ACETAMINOPHEN 5-325 MG PO TABS
1.0000 | ORAL_TABLET | Freq: Two times a day (BID) | ORAL | Status: DC | PRN
  Administered 2024-07-19 – 2024-07-23 (×7): 1 via ORAL
  Filled 2024-07-19 (×7): qty 1

## 2024-07-19 MED ORDER — ATORVASTATIN CALCIUM 40 MG PO TABS: 40.0000 mg | ORAL_TABLET | Freq: Every day | ORAL | Status: DC

## 2024-07-19 MED ORDER — GABAPENTIN 300 MG PO CAPS
300.0000 mg | ORAL_CAPSULE | Freq: Three times a day (TID) | ORAL | Status: DC
  Administered 2024-07-19 – 2024-07-23 (×14): 300 mg via ORAL
  Filled 2024-07-19 (×14): qty 1

## 2024-07-19 MED ORDER — ACETAMINOPHEN 325 MG PO TABS: 650.0000 mg | ORAL_TABLET | Freq: Four times a day (QID) | ORAL | Status: DC | PRN

## 2024-07-19 MED ORDER — ATORVASTATIN CALCIUM 80 MG PO TABS
80.0000 mg | ORAL_TABLET | Freq: Every day | ORAL | Status: DC
  Administered 2024-07-19 – 2024-07-23 (×5): 80 mg via ORAL
  Filled 2024-07-19 (×5): qty 1

## 2024-07-19 MED ORDER — ENOXAPARIN SODIUM 40 MG/0.4ML IJ SOSY
40.0000 mg | PREFILLED_SYRINGE | INTRAMUSCULAR | Status: DC
  Administered 2024-07-19 – 2024-07-22 (×4): 40 mg via SUBCUTANEOUS
  Filled 2024-07-19 (×4): qty 0.4

## 2024-07-19 MED ORDER — PANTOPRAZOLE SODIUM 40 MG PO TBEC
40.0000 mg | DELAYED_RELEASE_TABLET | Freq: Every day | ORAL | Status: DC
  Administered 2024-07-19: 40 mg via ORAL
  Filled 2024-07-19: qty 1

## 2024-07-19 MED ORDER — SUCRALFATE 1 GM/10ML PO SUSP
1.0000 g | Freq: Three times a day (TID) | ORAL | Status: DC
  Administered 2024-07-19 – 2024-07-23 (×13): 1 g via ORAL
  Filled 2024-07-19 (×13): qty 10

## 2024-07-19 MED ORDER — IRBESARTAN 75 MG PO TABS
37.5000 mg | ORAL_TABLET | Freq: Every day | ORAL | Status: DC
  Administered 2024-07-19 – 2024-07-20 (×2): 37.5 mg via ORAL
  Filled 2024-07-19 (×2): qty 0.5

## 2024-07-19 MED ORDER — CLOPIDOGREL BISULFATE 75 MG PO TABS
75.0000 mg | ORAL_TABLET | Freq: Every day | ORAL | Status: DC
  Administered 2024-07-19 – 2024-07-23 (×5): 75 mg via ORAL
  Filled 2024-07-19 (×5): qty 1

## 2024-07-19 MED ORDER — ACETAMINOPHEN 650 MG RE SUPP: 650.0000 mg | Freq: Four times a day (QID) | RECTAL | Status: DC | PRN

## 2024-07-19 MED ORDER — PANTOPRAZOLE SODIUM 40 MG PO TBEC
40.0000 mg | DELAYED_RELEASE_TABLET | Freq: Two times a day (BID) | ORAL | Status: DC
  Administered 2024-07-19 – 2024-07-23 (×8): 40 mg via ORAL
  Filled 2024-07-19 (×8): qty 1
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
## 2024-07-19 NOTE — ED Notes (Signed)
 Secure chat sent to provider that 12 Lead EKG completed and ready for review.
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
## 2024-07-19 NOTE — Progress Notes (Addendum)
 Patient admitted after midnight.  Please see H&P.  Cardiac enzymes flat.  Repeat EKG with some T wave inversions.  Echo pending, cardiology consult pending.  fortunately patient is chest pain-free but will hold off on heparin  for now  Will add carafate  and increase PPI CT Scan of chest to r/o pna  Harlene Bowl DO
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
## 2024-07-20 ENCOUNTER — Observation Stay (HOSPITAL_COMMUNITY)

## 2024-07-20 DIAGNOSIS — I200 Unstable angina: Secondary | ICD-10-CM | POA: Diagnosis not present

## 2024-07-20 DIAGNOSIS — R079 Chest pain, unspecified: Secondary | ICD-10-CM

## 2024-07-20 LAB — CBC
HCT: 36.4 % — ABNORMAL LOW (ref 39.0–52.0)
Hemoglobin: 11.5 g/dL — ABNORMAL LOW (ref 13.0–17.0)
MCH: 25.2 pg — ABNORMAL LOW (ref 26.0–34.0)
MCHC: 31.6 g/dL (ref 30.0–36.0)
MCV: 79.8 fL — ABNORMAL LOW (ref 80.0–100.0)
Platelets: 364 K/uL (ref 150–400)
RBC: 4.56 MIL/uL (ref 4.22–5.81)
RDW: 15.9 % — ABNORMAL HIGH (ref 11.5–15.5)
WBC: 6 K/uL (ref 4.0–10.5)
nRBC: 0 % (ref 0.0–0.2)

## 2024-07-20 LAB — ECHOCARDIOGRAM COMPLETE
AR max vel: 1.21 cm2
AV Area VTI: 1.12 cm2
AV Area mean vel: 1.05 cm2
AV Mean grad: 11.5 mmHg
AV Peak grad: 20.6 mmHg
Ao pk vel: 2.27 m/s
Area-P 1/2: 4.86 cm2
Calc EF: 50.7 %
Height: 66 in
S' Lateral: 4.2 cm
Single Plane A2C EF: 50.7 %
Single Plane A4C EF: 50 %
Weight: 2589.08 [oz_av]

## 2024-07-20 MED ORDER — PERFLUTREN LIPID MICROSPHERE
1.0000 mL | INTRAVENOUS | Status: AC | PRN
  Administered 2024-07-20: 3 mL via INTRAVENOUS

## 2024-07-20 MED ORDER — SACUBITRIL-VALSARTAN 24-26 MG PO TABS
1.0000 | ORAL_TABLET | Freq: Two times a day (BID) | ORAL | Status: DC
  Administered 2024-07-21 – 2024-07-23 (×5): 1 via ORAL
  Filled 2024-07-20 (×5): qty 1

## 2024-07-20 MED ORDER — SPIRONOLACTONE 25 MG PO TABS
25.0000 mg | ORAL_TABLET | Freq: Every day | ORAL | Status: DC
  Administered 2024-07-20 – 2024-07-23 (×4): 25 mg via ORAL
  Filled 2024-07-20 (×4): qty 1

## 2024-07-20 MED ORDER — METOPROLOL SUCCINATE ER 25 MG PO TB24
12.5000 mg | ORAL_TABLET | Freq: Every day | ORAL | Status: DC
  Administered 2024-07-20: 12.5 mg via ORAL
  Filled 2024-07-20: qty 1

## 2024-07-20 MED ORDER — EMPAGLIFLOZIN 10 MG PO TABS
10.0000 mg | ORAL_TABLET | Freq: Every day | ORAL | Status: DC
  Administered 2024-07-21 – 2024-07-23 (×3): 10 mg via ORAL
  Filled 2024-07-20 (×3): qty 1

## 2024-07-20 NOTE — Progress Notes (Signed)
 Echocardiogram 2D Echocardiogram has been performed.  Cameron Hoover 07/20/2024, 11:27 AM
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
## 2024-07-20 NOTE — Care Management Obs Status (Signed)
 MEDICARE OBSERVATION STATUS NOTIFICATION   Patient Details  Name: Cameron Hoover MRN: [id number] Date of Birth: 08/07/1955   Medicare Observation Status Notification Given:  Yes    Robynn Eileen Hoose, RN 07/20/2024, 10:10 AM

## 2024-07-20 NOTE — Progress Notes (Addendum)
 " PROGRESS NOTE    Cameron Hoover  FMW:[id number] DOB: Mar 28, 1956 DOA: 07/18/2024 PCP: Benjamin Raina Elizabeth, NP    Brief Narrative:    Cameron Hoover is a 68 y.o. male with history of CAD status post DES to mLAD in 2016 with history of bradycardia not on beta-blockers, chronic HFpEF prior history of upper GI bleed in 2017, history of CVA in 2023, hypertension, tobacco abuse, peripheral neuropathy and chronic back pain presents to the ER after patient started having sudden onset chest pain and shortness of breath at home last evening while he was trying to let his dogs go out.  Patient also acutely became diaphoretic.  Chest pain is retrosternal burning in sensation.  He took some Pepto-Bismol despite which the pain did not improve.  He went to his neighbor who was a engineer, civil (consulting) and was brought to the ER.   Assessment and Plan:  Chest pain with abnormal echo - Will ask cardiology to reevaluate in light of echo results - Echo:Unable to exclude a small apical left ventricular thrombus, consider cMRI for further evaluation.  -Chest pain could have been related to GERD as patient has a long history of this, CT scan did not show pneumonia so do not think this is contributing today has not happened yet who is going with that happens she is to get limited to happen-year-old  History of GERD - On PPI - Added Carafate   Anemia Hemoglobin stable - Will need close GI follow-up outpatient, if hemoglobin trends down or patient has GI bleeding will need inpatient evaluation  History of CVA On statin and Plavix   Tobacco abuse - Encourage cessation  DVT prophylaxis: enoxaparin  (LOVENOX ) injection 40 mg Start: 07/19/24 1600    Code Status: Full Code Family Communication:   Disposition Plan:  Level of care: Telemetry Status is: Observation     Consultants:  Cardiology   Subjective: Has been up walking the unit without chest pain  Objective: Vitals:   07/19/24 2348 07/20/24 0301 07/20/24  0717 07/20/24 1225  BP: 124/61 (!) 135/59  139/62  Pulse: 63 71  72  Resp: 18 18 19 17   Temp: 98 F (36.7 C) 98.2 F (36.8 C) 98.5 F (36.9 C) 97.6 F (36.4 C)  TempSrc: Oral Oral Oral Oral  SpO2: 93% 95%  95%  Weight:      Height:        Intake/Output Summary (Last 24 hours) at 07/20/2024 1324 Last data filed at 07/20/2024 1100 Gross per 24 hour  Intake 360 ml  Output --  Net 360 ml   Filed Weights   07/18/24 2357 07/19/24 1900  Weight: 73.9 kg 73.4 kg    Examination:   General: Appearance:     Overweight male in no acute distress     Lungs:      respirations unlabored  Heart:    Normal heart rate.     MS:   All extremities are intact.    Neurologic:   Awake, alert       Data Reviewed: I have personally reviewed following labs and imaging studies  CBC: Recent Labs  Lab 07/18/24 2143 07/19/24 0509 07/20/24 0938  WBC 10.5 7.2 6.0  NEUTROABS 8.3*  --   --   HGB 11.0* 10.5* 11.5*  HCT 35.9* 34.2* 36.4*  MCV 82.0 81.4 79.8*  PLT 355 337 364   Basic Metabolic Panel: Recent Labs  Lab 07/18/24 2143 07/19/24 0509  NA 137 136  K 4.2 4.0  CL  104 104  CO2 25 24  GLUCOSE 92 92  BUN 21 18  CREATININE 1.00 0.91  CALCIUM  9.3 9.2   GFR: Estimated Creatinine Clearance: 71.1 mL/min (by C-G formula based on SCr of 0.91 mg/dL). Liver Function Tests: Recent Labs  Lab 07/19/24 0509  AST 20  ALT 13  ALKPHOS 132*  BILITOT 0.5  PROT 7.2  ALBUMIN 4.0   No results for input(s): LIPASE, AMYLASE in the last 168 hours. No results for input(s): AMMONIA in the last 168 hours. Coagulation Profile: No results for input(s): INR, PROTIME in the last 168 hours. Cardiac Enzymes: No results for input(s): CKTOTAL, CKMB, CKMBINDEX, TROPONINI in the last 168 hours. BNP (last 3 results) Recent Labs    07/19/24 1242  PROBNP 150.0   HbA1C: No results for input(s): HGBA1C in the last 72 hours. CBG: No results for input(s): GLUCAP in the last  168 hours. Lipid Profile: No results for input(s): CHOL, HDL, LDLCALC, TRIG, CHOLHDL, LDLDIRECT in the last 72 hours. Thyroid Function Tests: Recent Labs    07/19/24 0509  TSH 1.040   Anemia Panel: Recent Labs    07/19/24 0509  VITAMINB12 417  FOLATE 6.6  FERRITIN 34  TIBC 381  IRON 28*  RETICCTPCT 1.3   Sepsis Labs: Recent Labs  Lab 07/19/24 1242  PROCALCITON <0.10    Recent Results (from the past 240 hours)  MRSA Next Gen by PCR, Nasal     Status: None   Collection Time: 07/19/24  7:11 PM   Specimen: Nasal Mucosa; Nasal Swab  Result Value Ref Range Status   MRSA by PCR Next Gen NOT DETECTED NOT DETECTED Final    Comment: (NOTE) The GeneXpert MRSA Assay (FDA approved for NASAL specimens only), is one component of a comprehensive MRSA colonization surveillance program. It is not intended to diagnose MRSA infection nor to guide or monitor treatment for MRSA infections. Test performance is not FDA approved in patients less than 76 years old. Performed at [facility], [address], [place], [place] [address]          Radiology Studies: ECHOCARDIOGRAM COMPLETE Result Date: 07/20/2024    ECHOCARDIOGRAM REPORT   Patient Name:   Cameron Hoover Date of Exam: 07/20/2024 Medical Rec #:  [id number]     Height:       66.0 in Accession #:    [id number]    Weight:       161.8 lb Date of Birth:  10-25-1955      BSA:          1.828 m Patient Age:    68 years      BP:           128/61 mmHg Patient Gender: M             HR:           68 bpm. Exam Location:  Inpatient Procedure: 2D Echo, Cardiac Doppler, Color Doppler and Intracardiac            Opacification Agent (Both Spectral and Color Flow Doppler were            utilized during procedure). Indications:    Chest Pain R07.9  History:        Patient has prior history of Echocardiogram examinations, most                 recent 05/31/2022. Cardiomyopathy, CAD; Risk  Factors:Hypertension, Dyslipidemia  and Former Smoker.  Sonographer:    Merlynn Argyle Referring Phys: [id number] TAYLOR A PARCELLS IMPRESSIONS  1. Left ventricular ejection fraction, by estimation, is 35 to 40%. The left ventricle has moderately decreased function. The left ventricle demonstrates regional wall motion abnormalities (see scoring diagram/findings for description). Left ventricular  diastolic parameters are consistent with Grade I diastolic dysfunction (impaired relaxation). Elevated left ventricular end-diastolic pressure. The E/e' is 16.  2. Right ventricular systolic function is normal. The right ventricular size is normal.  3. The mitral valve is degenerative. Mild mitral valve regurgitation.  4. The aortic valve is calcified. Unable to determine aortic valve morphology due to image quality. Aortic valve regurgitation is moderate. Probable moderate low flow low gradient aortic stenosis (peak velocity 2.102m/s, MG , AVA VTI 1.12cm2, DI 0.36, SVi 32).  5. The inferior vena cava is normal in size with greater than 50% respiratory variability, suggesting right atrial pressure of 3 mmHg. Comparison(s): A prior study was performed on 05/31/2022. LVEF 50%, Grade I diastolic dysfunction, Mild AS and AR. Conclusion(s)/Recommendation(s): Unable to exclude a small apical left ventricular thrombus, consider cMRI for further evaluation. FINDINGS  Left Ventricle: Left ventricular ejection fraction, by estimation, is 35 to 40%. The left ventricle has moderately decreased function. The left ventricle demonstrates regional wall motion abnormalities. Definity  contrast agent was given IV to delineate the left ventricular endocardial borders. The left ventricular internal cavity size was normal in size. There is no left ventricular hypertrophy. Left ventricular diastolic parameters are consistent with Grade I diastolic dysfunction (impaired relaxation). Elevated left ventricular end-diastolic pressure. The E/e' is 16.  LV Wall Scoring: The entire lateral  wall, entire inferior wall, and apex are hypokinetic. Right Ventricle: The right ventricular size is normal. Right vetricular wall thickness was not well visualized. Right ventricular systolic function is normal. Left Atrium: Left atrial size was normal in size. Right Atrium: Right atrial size was normal in size. Pericardium: There is no evidence of pericardial effusion. Mitral Valve: The mitral valve is degenerative in appearance. Mild mitral valve regurgitation. Tricuspid Valve: The tricuspid valve is grossly normal. Tricuspid valve regurgitation is not demonstrated. Aortic Valve: The aortic valve is calcified. Aortic valve regurgitation is moderate. Probable moderate low flow low gradient aortic stenosis (peak velocity 2.33m/s, MG , AVA VTI 1.12cm2, DI 0.36, SVi 32). Aortic valve mean gradient measures 11.5 mmHg. Aortic valve peak gradient measures 20.6 mmHg. Aortic valve area, by VTI measures 1.12 cm. Pulmonic Valve: The pulmonic valve was not well visualized. Pulmonic valve regurgitation is not visualized. Aorta: The aortic root and ascending aorta are structurally normal, with no evidence of dilitation. Venous: The inferior vena cava is normal in size with greater than 50% respiratory variability, suggesting right atrial pressure of 3 mmHg. IAS/Shunts: The atrial septum is grossly normal.  LEFT VENTRICLE PLAX 2D LVIDd:         5.20 cm      Diastology LVIDs:         4.20 cm      LV e' medial:    5.33 cm/s LV PW:         1.10 cm      LV E/e' medial:  20.5 LV IVS:        1.10 cm      LV e' lateral:   9.36 cm/s LVOT diam:     2.00 cm      LV E/e' lateral: 11.6 LV SV:  59 LV SV Index:   32 LVOT Area:     3.14 cm  LV Volumes (MOD) LV vol d, MOD A2C: 154.0 ml LV vol d, MOD A4C: 145.0 ml LV vol s, MOD A2C: 75.9 ml LV vol s, MOD A4C: 72.5 ml LV SV MOD A2C:     78.1 ml LV SV MOD A4C:     145.0 ml LV SV MOD BP:      79.3 ml RIGHT VENTRICLE             IVC RV Basal diam:  3.50 cm     IVC diam: 1.30 cm RV S  prime:     11.32 cm/s TAPSE (M-mode): 1.8 cm      PULMONARY VEINS                             Diastolic Velocity: 34.90 cm/s                             S/D Velocity:       0.70                             Systolic Velocity:  24.60 cm/s LEFT ATRIUM             Index        RIGHT ATRIUM           Index LA diam:        4.40 cm 2.41 cm/m   RA Area:     12.90 cm LA Vol (A2C):   39.1 ml 21.39 ml/m  RA Volume:   34.60 ml  18.93 ml/m LA Vol (A4C):   37.0 ml 20.24 ml/m LA Biplane Vol: 38.8 ml 21.23 ml/m  AORTIC VALVE AV Area (Vmax):    1.21 cm AV Area (Vmean):   1.05 cm AV Area (VTI):     1.12 cm AV Vmax:           227.00 cm/s AV Vmean:          159.500 cm/s AV VTI:            0.525 m AV Peak Grad:      20.6 mmHg AV Mean Grad:      11.5 mmHg LVOT Vmax:         87.55 cm/s LVOT Vmean:        53.150 cm/s LVOT VTI:          0.188 m LVOT/AV VTI ratio: 0.36  AORTA Ao Root diam: 3.20 cm Ao Asc diam:  3.10 cm MITRAL VALVE MV Area (PHT): 4.86 cm     SHUNTS MV Decel Time: 156 msec     Systemic VTI:  0.19 m MV E velocity: 109.00 cm/s  Systemic Diam: 2.00 cm MV A velocity: 92.20 cm/s MV E/A ratio:  1.18 Sunit Tolia Electronically signed by Madonna Large Signature Date/Time: 07/20/2024/12:18:43 PM    Final    CT CHEST WO CONTRAST Result Date: 07/19/2024 CLINICAL DATA:  Shortness of breath. Pneumonia with complication suspected. Ex-smoker. Known congestive heart failure. EXAM: CT CHEST WITHOUT CONTRAST TECHNIQUE: Multidetector CT imaging of the chest was performed following the standard protocol without IV contrast. RADIATION DOSE REDUCTION: This exam was performed according to the departmental dose-optimization program which includes automated exposure control, adjustment of the mA and/or kV according to patient size  and/or use of iterative reconstruction technique. COMPARISON:  Chest radiograph of 1 day prior. CT of the chest of 06/10/2023. FINDINGS: Cardiovascular: Aortic atherosclerosis. Tortuous thoracic aorta. Mild  cardiomegaly, without pericardial effusion. Left main and 3 vessel coronary artery calcification. Mediastinum/Nodes: No axillary adenopathy. Mediastinal adenopathy again identified. Right paratracheal index node measures 1.2 cm on 50/3 and is similar. Subcarinal node measures 1.8 cm on 74/3 versus 1.5 cm on the prior. Hilar regions poorly evaluated without intravenous contrast. Suspect left infrahilar adenopathy at 1.2 cm on 80/3, relatively similar. Lungs/Pleura: No pleural fluid.  Moderate centrilobular emphysema. Smooth septal thickening is most apparent at the lung bases, including in the left lower lobe on image 111/4. Millimetric bilateral pulmonary nodules are similar including in the inferior right upper lobe at 4 mm on image 75/4. Upper Abdomen: Normal imaged portions of the liver, spleen, stomach, pancreas, gallbladder, adrenal glands, kidneys. Abdominal aortic atherosclerosis. Musculoskeletal: Midthoracic spondylosis. IMPRESSION: 1. No evidence of pneumonia. 2. Subtle smooth septal thickening, most apparent at the lung bases. This suggests mild fluid overload superimposed upon emphysema. 3. Thoracic adenopathy which is chronic and primarily similar. Most likely reactive. 4. Aortic valvular calcifications. Consider echocardiography to evaluate for valvular dysfunction. 5. Aortic atherosclerosis (ICD10-I70.0), coronary artery atherosclerosis and emphysema (ICD10-J43.9). Electronically Signed   By: Rockey Kilts M.D.   On: 07/19/2024 14:30   DG Chest 2 View Result Date: 07/18/2024 EXAM: 2 VIEW(S) XRAY OF THE CHEST 07/18/2024 09:56:37 PM COMPARISON: None available. CLINICAL HISTORY: CP SHOB FINDINGS: LUNGS AND PLEURA: Chronic coarsened interstitial markings without pulmonary edema. No focal pulmonary opacity. No pleural effusion. No pneumothorax. HEART AND MEDIASTINUM: Aortic calcification. No acute abnormality of the cardiac and mediastinal silhouettes. BONES AND SOFT TISSUES: No acute osseous  abnormality. IMPRESSION: 1. No acute findings. 2. Chronic coarsened interstitial markings without pulmonary edema. Electronically signed by: Oneil Devonshire MD 07/18/2024 10:03 PM EST RP Workstation: [id number]        Scheduled Meds:  atorvastatin   80 mg Oral Daily   clopidogrel   75 mg Oral Daily   enoxaparin  (LOVENOX ) injection  40 mg Subcutaneous Q24H   gabapentin   300 mg Oral TID   irbesartan   37.5 mg Oral Daily   pantoprazole   40 mg Oral BID   sucralfate   1 g Oral TID WC & HS   Continuous Infusions:   LOS: 0 days    Time spent: 45 minutes spent on chart review, discussion with nursing staff, consultants, updating family and interview/physical exam; more than 50% of that time was spent in counseling and/or coordination of care.    Harlene RAYMOND Bowl, DO [facility] Available via Epic secure chat 7am-7pm After these hours, please refer to coverage provider listed on [email or web address] 07/20/2024, 1:24 PM   "

## 2024-07-21 DIAGNOSIS — R7989 Other specified abnormal findings of blood chemistry: Secondary | ICD-10-CM

## 2024-07-21 DIAGNOSIS — I255 Ischemic cardiomyopathy: Secondary | ICD-10-CM

## 2024-07-21 DIAGNOSIS — R079 Chest pain, unspecified: Secondary | ICD-10-CM | POA: Diagnosis not present

## 2024-07-21 DIAGNOSIS — I63332 Cerebral infarction due to thrombosis of left posterior cerebral artery: Secondary | ICD-10-CM | POA: Diagnosis not present

## 2024-07-21 DIAGNOSIS — K219 Gastro-esophageal reflux disease without esophagitis: Secondary | ICD-10-CM | POA: Diagnosis not present

## 2024-07-21 DIAGNOSIS — I200 Unstable angina: Secondary | ICD-10-CM | POA: Diagnosis not present

## 2024-07-21 DIAGNOSIS — I10 Essential (primary) hypertension: Secondary | ICD-10-CM | POA: Diagnosis not present

## 2024-07-21 DIAGNOSIS — R9389 Abnormal findings on diagnostic imaging of other specified body structures: Secondary | ICD-10-CM | POA: Diagnosis not present

## 2024-07-21 MED ORDER — METOPROLOL SUCCINATE ER 25 MG PO TB24
25.0000 mg | ORAL_TABLET | Freq: Every day | ORAL | Status: DC
  Administered 2024-07-21: 25 mg via ORAL
  Filled 2024-07-21: qty 1

## 2024-07-21 NOTE — Plan of Care (Signed)
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
## 2024-07-21 NOTE — Progress Notes (Signed)
 [facility]  PROGRESS NOTE  GOR VESTAL FMW:[id number] DOB: 07/17/1956 DOA: 07/18/2024 PCP: Benjamin Raina Elizabeth, NP   Brief HPI:     68 y.o. male with history of CAD status post DES to mLAD in 2016 with history of bradycardia not on beta-blockers, chronic HFpEF prior history of upper GI bleed in 2017, history of CVA in 2023, hypertension, tobacco abuse, peripheral neuropathy and chronic back pain presents to the ER after patient started having sudden onset chest pain and shortness of breath at home last evening while he was trying to let his dogs go out.  Patient also acutely became diaphoretic.  Chest pain is retrosternal burning in sensation.  He took some Pepto-Bismol despite which the pain did not improve.  He went to his neighbor who was a engineer, civil (consulting) and was brought to the ER.    Assessment/Plan:    Chest pain with abnormal echo -Cardiology was consulted, no intervention recommended -Presented with chest pain; likely from GERD/peptic ulcer disease -Echocardiogram showed small apical left ventricular thrombus, cardiology was reconsulted -Plan for cardiac MRI on Monday - Echo:Unable to exclude a small apical left ventricular thrombus, consider cMRI for further evaluation.     History of GERD - On PPI - Added Carafate    Anemia Hemoglobin stable - Will need close GI follow-up outpatient, if hemoglobin trends down or patient has GI bleeding will need inpatient evaluation   History of CVA On statin and Plavix    Tobacco abuse - Encourage cessation     DVT prophylaxis: Lovenox   Medications     atorvastatin   80 mg Oral Daily   clopidogrel   75 mg Oral Daily   empagliflozin   10 mg Oral Daily   enoxaparin  (LOVENOX ) injection  40 mg Subcutaneous Q24H   gabapentin   300 mg Oral TID   metoprolol  succinate  25 mg Oral Daily   pantoprazole   40 mg Oral BID   sacubitril -valsartan   1 tablet Oral BID   spironolactone   25 mg Oral Daily   sucralfate   1 g Oral TID WC & HS      Data Reviewed:   CBG:  No results for input(s): GLUCAP in the last 168 hours.  SpO2: 96 % O2 Flow Rate (L/min): 2 L/min    Vitals:   07/20/24 1924 07/20/24 2327 07/21/24 0350 07/21/24 0735  BP: (!) 119/59 (!) 100/56 124/60 (!) 124/57  Pulse: 75 65 60 61  Resp: 20 16 15 16   Temp: 98.1 F (36.7 C) 98.4 F (36.9 C) 98.3 F (36.8 C) 98 F (36.7 C)  TempSrc: Oral Oral Oral Oral  SpO2: 94% 95% 96% 96%  Weight:      Height:          Data Reviewed:  Basic Metabolic Panel: Recent Labs  Lab 07/18/24 2143 07/19/24 0509  NA 137 136  K 4.2 4.0  CL 104 104  CO2 25 24  GLUCOSE 92 92  BUN 21 18  CREATININE 1.00 0.91  CALCIUM  9.3 9.2    CBC: Recent Labs  Lab 07/18/24 2143 07/19/24 0509 07/20/24 0938  WBC 10.5 7.2 6.0  NEUTROABS 8.3*  --   --   HGB 11.0* 10.5* 11.5*  HCT 35.9* 34.2* 36.4*  MCV 82.0 81.4 79.8*  PLT 355 337 364    LFT Recent Labs  Lab 07/19/24 0509  AST 20  ALT 13  ALKPHOS 132*  BILITOT 0.5  PROT 7.2  ALBUMIN 4.0     Antibiotics: Anti-infectives (From admission, onward)  None        CONSULTS   Code Status: Full code  Family Communication: No family at bedside     Subjective   Patient seen and examined.  Denies chest pain   Objective    Physical Examination:   General-appears in no acute distress Heart-S1-S2, regular, no murmur auscultated Lungs-clear to auscultation bilaterally, no wheezing or crackles auscultated Abdomen-soft, nontender, no organomegaly Extremities-no edema in the lower extremities Neuro-alert, oriented x3, no focal deficit noted            Kavitha Lansdale S Ladavia Lindenbaum   [facility] If 7PM-7AM, please contact night-coverage at [email or web address], Office  [phone]   07/21/2024, 8:18 AM  LOS: 0 days

## 2024-07-21 NOTE — Plan of Care (Signed)

## 2024-07-22 ENCOUNTER — Other Ambulatory Visit (HOSPITAL_COMMUNITY): Payer: Self-pay

## 2024-07-22 ENCOUNTER — Encounter (HOSPITAL_COMMUNITY): Payer: Self-pay | Admitting: Internal Medicine

## 2024-07-22 ENCOUNTER — Observation Stay (HOSPITAL_COMMUNITY)

## 2024-07-22 ENCOUNTER — Telehealth (HOSPITAL_COMMUNITY): Payer: Self-pay

## 2024-07-22 DIAGNOSIS — I5042 Chronic combined systolic (congestive) and diastolic (congestive) heart failure: Secondary | ICD-10-CM | POA: Diagnosis not present

## 2024-07-22 DIAGNOSIS — I25110 Atherosclerotic heart disease of native coronary artery with unstable angina pectoris: Secondary | ICD-10-CM | POA: Diagnosis not present

## 2024-07-22 DIAGNOSIS — I63332 Cerebral infarction due to thrombosis of left posterior cerebral artery: Secondary | ICD-10-CM | POA: Diagnosis not present

## 2024-07-22 DIAGNOSIS — I200 Unstable angina: Secondary | ICD-10-CM | POA: Diagnosis not present

## 2024-07-22 DIAGNOSIS — R7989 Other specified abnormal findings of blood chemistry: Secondary | ICD-10-CM | POA: Diagnosis not present

## 2024-07-22 DIAGNOSIS — I110 Hypertensive heart disease with heart failure: Secondary | ICD-10-CM | POA: Diagnosis not present

## 2024-07-22 DIAGNOSIS — Z87891 Personal history of nicotine dependence: Secondary | ICD-10-CM | POA: Diagnosis not present

## 2024-07-22 DIAGNOSIS — I10 Essential (primary) hypertension: Secondary | ICD-10-CM | POA: Diagnosis not present

## 2024-07-22 DIAGNOSIS — Z8673 Personal history of transient ischemic attack (TIA), and cerebral infarction without residual deficits: Secondary | ICD-10-CM | POA: Diagnosis not present

## 2024-07-22 DIAGNOSIS — I255 Ischemic cardiomyopathy: Secondary | ICD-10-CM

## 2024-07-22 DIAGNOSIS — K219 Gastro-esophageal reflux disease without esophagitis: Secondary | ICD-10-CM | POA: Diagnosis not present

## 2024-07-22 DIAGNOSIS — D649 Anemia, unspecified: Secondary | ICD-10-CM | POA: Diagnosis not present

## 2024-07-22 MED ORDER — GADOBUTROL 1 MMOL/ML IV SOLN
10.0000 mL | Freq: Once | INTRAVENOUS | Status: AC | PRN
  Administered 2024-07-22: 10 mL via INTRAVENOUS
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
## 2024-07-22 NOTE — Telephone Encounter (Signed)
 Pharmacy Patient Advocate Encounter  Insurance verification completed.    The patient is insured through [employer]. Patient has Medicare and is not eligible for a copay card, but may be able to apply for patient assistance or Medicare RX Payment Plan (Patient Must reach out to their plan, if eligible for payment plan), if available.    Ran test claim for Jardiance  10mg  tablet and the current 30 day co-pay is $12.15.  Ran test claim for Farxiga 10mg  tablet and the current 30 day co-pay is $12.15.  Ran test claim for Eliquis 5mg  tablet and the current 30 day co-pay is $12.15.  Ran test claim for Entresto  24-26mg  tablet and the current 30 day co-pay is $4.90.  This test claim was processed through [facility]- copay amounts may vary at other pharmacies due to pharmacy/plan contracts, or as the patient moves through the different stages of their insurance plan.

## 2024-07-22 NOTE — Plan of Care (Signed)

## 2024-07-22 NOTE — Progress Notes (Signed)
 [facility]  PROGRESS NOTE  TIMOHTY RENBARGER FMW:[id number] DOB: [birth year] DOA: 07/18/2024 PCP: Benjamin Raina Elizabeth, NP   Brief HPI:     68 y.o. male with history of CAD status post DES to mLAD in 2016 with history of bradycardia not on beta-blockers, chronic HFpEF prior history of upper GI bleed in 2017, history of CVA in 2023, hypertension, tobacco abuse, peripheral neuropathy and chronic back pain presents to the ER after patient started having sudden onset chest pain and shortness of breath at home last evening while he was trying to let his dogs go out.  Patient also acutely became diaphoretic.  Chest pain is retrosternal burning in sensation.  He took some Pepto-Bismol despite which the pain did not improve.  He went to his neighbor who was a engineer, civil (consulting) and was brought to the ER.    Assessment/Plan:    Chest pain with abnormal echo -Cardiology was consulted, no intervention recommended -Presented with chest pain; likely from GERD/peptic ulcer disease -Echocardiogram showed small apical left ventricular thrombus, cardiology was reconsulted -Plan for cardiac MRI today - Echo:Unable to exclude a small apical left ventricular thrombus, consider cMRI for further evaluation.     History of GERD - On PPI - Added Carafate    Anemia Hemoglobin stable - Will need close GI follow-up outpatient, if hemoglobin trends down or patient has GI bleeding will need inpatient evaluation   History of CVA On statin and Plavix    Tobacco abuse - Encourage cessation     DVT prophylaxis: Lovenox   Medications     atorvastatin   80 mg Oral Daily   clopidogrel   75 mg Oral Daily   empagliflozin   10 mg Oral Daily   enoxaparin  (LOVENOX ) injection  40 mg Subcutaneous Q24H   gabapentin   300 mg Oral TID   pantoprazole   40 mg Oral BID   sacubitril -valsartan   1 tablet Oral BID   spironolactone   25 mg Oral Daily   sucralfate   1 g Oral TID WC & HS     Data Reviewed:   CBG:  No results for  input(s): GLUCAP in the last 168 hours.  SpO2: 99 % O2 Flow Rate (L/min): 2 L/min    Vitals:   07/21/24 2003 07/21/24 2335 07/22/24 0450 07/22/24 0700  BP: 123/65 (!) 114/42 (!) 106/59 (!) 114/49  Pulse: (!) 58 (!) 59 73 60  Resp: 17 18 18 16   Temp: 97.7 F (36.5 C) 98.1 F (36.7 C) 98.3 F (36.8 C) (!) 97.5 F (36.4 C)  TempSrc: Axillary Oral Oral Oral  SpO2: 96% 96% 94% 99%  Weight:      Height:          Data Reviewed:  Basic Metabolic Panel: Recent Labs  Lab 07/18/24 2143 07/19/24 0509  NA 137 136  K 4.2 4.0  CL 104 104  CO2 25 24  GLUCOSE 92 92  BUN 21 18  CREATININE 1.00 0.91  CALCIUM  9.3 9.2    CBC: Recent Labs  Lab 07/18/24 2143 07/19/24 0509 07/20/24 0938  WBC 10.5 7.2 6.0  NEUTROABS 8.3*  --   --   HGB 11.0* 10.5* 11.5*  HCT 35.9* 34.2* 36.4*  MCV 82.0 81.4 79.8*  PLT 355 337 364    LFT Recent Labs  Lab 07/19/24 0509  AST 20  ALT 13  ALKPHOS 132*  BILITOT 0.5  PROT 7.2  ALBUMIN 4.0     Antibiotics: Anti-infectives (From admission, onward)    None  CONSULTS   Code Status: Full code  Family Communication: No family at bedside     Subjective   Patient seen and examined.  Denies chest pain or shortness of breath.   Objective    Physical Examination:   General-appears in no acute distress Heart-S1-S2, regular, no murmur auscultated Lungs-clear to auscultation bilaterally, no wheezing or crackles auscultated Abdomen-soft, nontender, no organomegaly Extremities-no edema in the lower extremities Neuro-alert, oriented x3, no focal deficit noted            Mikaylah Libbey S Reznor Ferrando   [facility] If 7PM-7AM, please contact night-coverage at [email or web address], Office  [phone]   07/22/2024, 9:45 AM  LOS: 0 days

## 2024-07-22 NOTE — Progress Notes (Addendum)
 "  Rounding Note   Patient Name: Cameron Hoover Date of Encounter: 07/22/2024  [facility] Cardiologist: Madonna Large, DO   Subjective Patient admitted with chest pain and shortness of breath.  No longer has chest pain.  Shortness of breath has improved.  Scheduled Meds:  atorvastatin   80 mg Oral Daily   clopidogrel   75 mg Oral Daily   empagliflozin   10 mg Oral Daily   enoxaparin  (LOVENOX ) injection  40 mg Subcutaneous Q24H   gabapentin   300 mg Oral TID   pantoprazole   40 mg Oral BID   sacubitril -valsartan   1 tablet Oral BID   spironolactone   25 mg Oral Daily   sucralfate   1 g Oral TID WC & HS   Continuous Infusions:  PRN Meds: acetaminophen  **OR** acetaminophen , HYDROcodone -acetaminophen    Vital Signs  Vitals:   07/21/24 2003 07/21/24 2335 07/22/24 0450 07/22/24 0700  BP: 123/65 (!) 114/42 (!) 106/59 (!) 114/49  Pulse: (!) 58 (!) 59 73 60  Resp: 17 18 18 16   Temp: 97.7 F (36.5 C) 98.1 F (36.7 C) 98.3 F (36.8 C) (!) 97.5 F (36.4 C)  TempSrc: Axillary Oral Oral Oral  SpO2: 96% 96% 94% 99%  Weight:      Height:        Intake/Output Summary (Last 24 hours) at 07/22/2024 0813 Last data filed at 07/22/2024 0738 Gross per 24 hour  Intake 720 ml  Output --  Net 720 ml      07/19/2024    7:00 PM 07/18/2024   11:57 PM 01/10/2024   10:37 AM  Last 3 Weights  Weight (lbs) 161 lb 13.1 oz 163 lb 161 lb 9.6 oz  Weight (kg) 73.4 kg 73.936 kg 73.301 kg      Telemetry Sinus rhythm- Personally Reviewed  ECG  Not performed today- Personally Reviewed  Physical Exam  GEN: No acute distress.   Neck: No JVD, soft bilateral carotid bruits Cardiac: RRR, soft outflow tract murmur, rubs, or gallops.  Respiratory: Clear to auscultation bilaterally. GI: Soft, nontender, non-distended  MS: No edema; No deformity. Neuro:  Nonfocal  Psych: Normal affect   Labs High Sensitivity Troponin:  No results for input(s): TROPONINIHS in the last 720 hours.  Recent  Labs  Lab 07/18/24 2143 07/19/24 0300 07/19/24 0509  TRNPT 36* 29* 31*       Chemistry Recent Labs  Lab 07/18/24 2143 07/19/24 0509  NA 137 136  K 4.2 4.0  CL 104 104  CO2 25 24  GLUCOSE 92 92  BUN 21 18  CREATININE 1.00 0.91  CALCIUM  9.3 9.2  PROT  --  7.2  ALBUMIN  --  4.0  AST  --  20  ALT  --  13  ALKPHOS  --  132*  BILITOT  --  0.5  GFRNONAA >60 >60  ANIONGAP 9 8    Lipids No results for input(s): CHOL, TRIG, HDL, LABVLDL, LDLCALC, CHOLHDL in the last 168 hours.  Hematology Recent Labs  Lab 07/18/24 2143 07/19/24 0509 07/20/24 0938  WBC 10.5 7.2 6.0  RBC 4.38 4.20*  4.15* 4.56  HGB 11.0* 10.5* 11.5*  HCT 35.9* 34.2* 36.4*  MCV 82.0 81.4 79.8*  MCH 25.1* 25.0* 25.2*  MCHC 30.6 30.7 31.6  RDW 15.7* 15.8* 15.9*  PLT 355 337 364   Thyroid  Recent Labs  Lab 07/19/24 0509  TSH 1.040    BNP Recent Labs  Lab 07/19/24 1242  PROBNP 150.0    DDimer  Recent  Labs  Lab 07/19/24 0509  DDIMER 0.46     Radiology  ECHOCARDIOGRAM COMPLETE Result Date: 07/20/2024    ECHOCARDIOGRAM REPORT   Patient Name:   Cameron Hoover Date of Exam: 07/20/2024 Medical Rec #:  [id number]     Height:       66.0 in Accession #:    [id number]    Weight:       161.8 lb Date of Birth:  [birth year]      BSA:          1.828 m Patient Age:    68 years      BP:           128/61 mmHg Patient Gender: M             HR:           68 bpm. Exam Location:  Inpatient Procedure: 2D Echo, Cardiac Doppler, Color Doppler and Intracardiac            Opacification Agent (Both Spectral and Color Flow Doppler were            utilized during procedure). Indications:    Chest Pain R07.9  History:        Patient has prior history of Echocardiogram examinations, most                 recent 05/31/2022. Cardiomyopathy, CAD; Risk                 Factors:Hypertension, Dyslipidemia and Former Smoker.  Sonographer:    Merlynn Argyle Referring Phys: [id number] TAYLOR A PARCELLS IMPRESSIONS  1. Left ventricular  ejection fraction, by estimation, is 35 to 40%. The left ventricle has moderately decreased function. The left ventricle demonstrates regional wall motion abnormalities (see scoring diagram/findings for description). Left ventricular  diastolic parameters are consistent with Grade I diastolic dysfunction (impaired relaxation). Elevated left ventricular end-diastolic pressure. The E/e' is 16.  2. Right ventricular systolic function is normal. The right ventricular size is normal.  3. The mitral valve is degenerative. Mild mitral valve regurgitation.  4. The aortic valve is calcified. Unable to determine aortic valve morphology due to image quality. Aortic valve regurgitation is moderate. Probable moderate low flow low gradient aortic stenosis (peak velocity 2.48m/s, MG , AVA VTI 1.12cm2, DI 0.36, SVi 32).  5. The inferior vena cava is normal in size with greater than 50% respiratory variability, suggesting right atrial pressure of 3 mmHg. Comparison(s): A prior study was performed on 05/31/2022. LVEF 50%, Grade I diastolic dysfunction, Mild AS and AR. Conclusion(s)/Recommendation(s): Unable to exclude a small apical left ventricular thrombus, consider cMRI for further evaluation. FINDINGS  Left Ventricle: Left ventricular ejection fraction, by estimation, is 35 to 40%. The left ventricle has moderately decreased function. The left ventricle demonstrates regional wall motion abnormalities. Definity  contrast agent was given IV to delineate the left ventricular endocardial borders. The left ventricular internal cavity size was normal in size. There is no left ventricular hypertrophy. Left ventricular diastolic parameters are consistent with Grade I diastolic dysfunction (impaired relaxation). Elevated left ventricular end-diastolic pressure. The E/e' is 16.  LV Wall Scoring: The entire lateral wall, entire inferior wall, and apex are hypokinetic. Right Ventricle: The right ventricular size is normal. Right  vetricular wall thickness was not well visualized. Right ventricular systolic function is normal. Left Atrium: Left atrial size was normal in size. Right Atrium: Right atrial size was normal in size. Pericardium: There is no evidence of pericardial effusion. Mitral Valve:  The mitral valve is degenerative in appearance. Mild mitral valve regurgitation. Tricuspid Valve: The tricuspid valve is grossly normal. Tricuspid valve regurgitation is not demonstrated. Aortic Valve: The aortic valve is calcified. Aortic valve regurgitation is moderate. Probable moderate low flow low gradient aortic stenosis (peak velocity 2.40m/s, MG , AVA VTI 1.12cm2, DI 0.36, SVi 32). Aortic valve mean gradient measures 11.5 mmHg. Aortic valve peak gradient measures 20.6 mmHg. Aortic valve area, by VTI measures 1.12 cm. Pulmonic Valve: The pulmonic valve was not well visualized. Pulmonic valve regurgitation is not visualized. Aorta: The aortic root and ascending aorta are structurally normal, with no evidence of dilitation. Venous: The inferior vena cava is normal in size with greater than 50% respiratory variability, suggesting right atrial pressure of 3 mmHg. IAS/Shunts: The atrial septum is grossly normal.  LEFT VENTRICLE PLAX 2D LVIDd:         5.20 cm      Diastology LVIDs:         4.20 cm      LV e' medial:    5.33 cm/s LV PW:         1.10 cm      LV E/e' medial:  20.5 LV IVS:        1.10 cm      LV e' lateral:   9.36 cm/s LVOT diam:     2.00 cm      LV E/e' lateral: 11.6 LV SV:         59 LV SV Index:   32 LVOT Area:     3.14 cm  LV Volumes (MOD) LV vol d, MOD A2C: 154.0 ml LV vol d, MOD A4C: 145.0 ml LV vol s, MOD A2C: 75.9 ml LV vol s, MOD A4C: 72.5 ml LV SV MOD A2C:     78.1 ml LV SV MOD A4C:     145.0 ml LV SV MOD BP:      79.3 ml RIGHT VENTRICLE             IVC RV Basal diam:  3.50 cm     IVC diam: 1.30 cm RV S prime:     11.32 cm/s TAPSE (M-mode): 1.8 cm      PULMONARY VEINS                             Diastolic Velocity:  34.90 cm/s                             S/D Velocity:       0.70                             Systolic Velocity:  24.60 cm/s LEFT ATRIUM             Index        RIGHT ATRIUM           Index LA diam:        4.40 cm 2.41 cm/m   RA Area:     12.90 cm LA Vol (A2C):   39.1 ml 21.39 ml/m  RA Volume:   34.60 ml  18.93 ml/m LA Vol (A4C):   37.0 ml 20.24 ml/m LA Biplane Vol: 38.8 ml 21.23 ml/m  AORTIC VALVE AV Area (Vmax):    1.21 cm AV Area (Vmean):   1.05 cm AV Area (VTI):  1.12 cm AV Vmax:           227.00 cm/s AV Vmean:          159.500 cm/s AV VTI:            0.525 m AV Peak Grad:      20.6 mmHg AV Mean Grad:      11.5 mmHg LVOT Vmax:         87.55 cm/s LVOT Vmean:        53.150 cm/s LVOT VTI:          0.188 m LVOT/AV VTI ratio: 0.36  AORTA Ao Root diam: 3.20 cm Ao Asc diam:  3.10 cm MITRAL VALVE MV Area (PHT): 4.86 cm     SHUNTS MV Decel Time: 156 msec     Systemic VTI:  0.19 m MV E velocity: 109.00 cm/s  Systemic Diam: 2.00 cm MV A velocity: 92.20 cm/s MV E/A ratio:  1.18 Sunit Tolia Electronically signed by Madonna Large Signature Date/Time: 07/20/2024/12:18:43 PM    Final     Cardiac Studies  2D echocardiogram (07/20/2024)  IMPRESSIONS     1. Left ventricular ejection fraction, by estimation, is 35 to 40%. The  left ventricle has moderately decreased function. The left ventricle  demonstrates regional wall motion abnormalities (see scoring  diagram/findings for description). Left ventricular   diastolic parameters are consistent with Grade I diastolic dysfunction  (impaired relaxation). Elevated left ventricular end-diastolic pressure.  The E/e' is 16.   2. Right ventricular systolic function is normal. The right ventricular  size is normal.   3. The mitral valve is degenerative. Mild mitral valve regurgitation.   4. The aortic valve is calcified. Unable to determine aortic valve  morphology due to image quality. Aortic valve regurgitation is moderate.  Probable moderate low flow low  gradient aortic stenosis (peak velocity  2.73m/s, MG , AVA VTI 1.12cm2, DI  0.36, SVi 32).   5. The inferior vena cava is normal in size with greater than 50%  respiratory variability, suggesting right atrial pressure of 3 mmHg.   Comparison(s): A prior study was performed on 05/31/2022. LVEF 50%, Grade  I diastolic dysfunction, Mild AS and AR.   Conclusion(s)/Recommendation(s): Unable to exclude a small apical left  ventricular thrombus, consider cMRI for further evaluation.   Patient Profile     Cameron Hoover is a 68 y.o. male with with a hx of CAD s/p DES to mLAD in 2016, bradycardia (not on BB due to this), chronic HFpEF, UGIB 03/2016 gastritis/duodenitis, CVA in 2023, hypertension, hyperlipidemia, chronic HFpEF, AI/AS who was evaluated for chest pain   Assessment & Plan   1: CAD-history of CAD status post LAD DES 2016.  He is on clopidogrel  apparently for prior stroke.  Aspirin  was started during his hospitalization though he has not been on in the past because of GI bleed.  He was admitted with chest pain and shortness of breath.  He thought his chest pain was related to GERD but it did not improve with antacids.  EKG showed LVH with repolarization changes.  Troponins were low and flat.  Patient scheduled for stress cardiac MRI today.  2: Heart failure with reduced EF-EF 2 years ago was 50% with global hypokinesia, EF during this hospitalization was 35 to 40%.  Medications were adjusted for GDMT.  His lungs are clear on exam.  3: Valvular heart disease-2D echo did showed moderate AI/AS.  See MRI scheduled for today.  4: Hyperlipidemia-on statin drugs.  Lipitor  increased to 80 mg.  5: Anemia-hemoglobin was 14 a year ago, 11.5 currently.  History of stress cardiac MRI does not show significant CAD, the patient can probably be discharged home on medical therapy in the next 24 hours.    For questions or updates, please contact [facility] Please consult [email or web address]  for contact info under       Signed, Dorn Lesches, MD  07/22/2024, 8:13 AM    Addendum: Results of cardiac MRI noted.  EF significantly reduced at 24%.  Multiple wall motion abnormalities though no segments are viable except lateral wall.  In addition, there is no LV thrombus noted.  Given patient's chest pain I favor diagnostic coronary angiography tomorrow to define his anatomy.  I have discussed this with the patient and he agrees to proceed.  Dorn DOROTHA Lesches, M.D., FACP, [facility], FAHA, [facility]  [address] [place], [place]  [address]  [phone] 07/22/2024 4:23 PM  "

## 2024-07-22 NOTE — Progress Notes (Signed)
" ° °  Heart Failure Stewardship Pharmacist Progress Note   PCP: Benjamin Raina Elizabeth, NP PCP-Cardiologist: Madonna Large, DO    HPI:  68 yo M with PMH of CAD, bradycardia, CHF, GIB 2017, CVA 2023, HTN, tobacco use, peripheral neuropathy, and chronic back pain.  Presented to the ED on 12/18 with chest pain and shortness of breath associated with diaphoresis. Trop 36>29. EKG NSR. CXR unremarkable. proBNP 150. ECHO 12/20 with LVEF reduced to 35-40%, RWMA, G1DD, RV normal, moderate low flow gradient aortic stenosis, possible LV thrombus. cMRI pending to further evaluate LV thrombus and progression of AS.   Patient still at MRI - will follow up tomorrow.   Current HF Medications: ACE/ARB/ARNI: Entresto  24/26 mg BID MRA: spironolactone  25 mg daily SGLT2i: Jardiance  10 mg daily  Prior to admission HF Medications: ACE/ARB/ARNI: olmesartan 5 mg daily  Pertinent Lab Values: 12/19: Serum creatinine 0.91, BUN 18, Potassium 4.0, Sodium 136, proBNP 150  Vital Signs: Weight: 161 lbs (admission weight: 163 lbs) Blood pressure: 110/50s  Heart rate: 60s  I/O: incomplete  Medication Assistance / Insurance Benefits Check: Does the patient have prescription insurance?  Yes Type of insurance plan: [employer]  Outpatient Pharmacy:  Prior to admission outpatient pharmacy: [facility] Is the patient willing to use [facility] TOC pharmacy at discharge? Yes Is the patient willing to transition their outpatient pharmacy to utilize a [facility] outpatient pharmacy?   No    Assessment: 1. Acute on chronic systolic CHF (LVEF 35-40%). NYHA class II symptoms. - No BB with bradycardia - Continue Entresto  24/26 mg BID - Continue spironolactone  25 mg daily - Continue Jardiance  10 mg daily    Plan: 1) Medication changes recommended at this time: - Continue current regimen  2) Patient assistance: - Entresto  copay $4.90 - Farxiga copay $12.15 Jardiance  copay $12.15  3)  Education  - To  be completed prior to discharge  Duwaine Plant, PharmD, BCPS Heart Failure Stewardship Pharmacist Phone [phone]   "

## 2024-07-22 NOTE — TOC CM/SW Note (Signed)
 Transition of Care [facility]) - Inpatient Brief Assessment   Patient Details  Name: Cameron Hoover MRN: [id number] Date of Birth: [birth year]  Transition of Care [facility]) CM/SW Contact:    Lauraine FORBES Saa, LCSWA Phone Number: 07/22/2024, 11:02 AM   Clinical Narrative:  11:02 AM Per chart review, patient resides at home alone. Patient has a PCP and insurance. Patient does not have SNF/HH/DME history. Patient's preferred pharmacy's are [facility] and [facility]. No TOC needs identified at this time. TOC will continue to follow.  Transition of Care Asessment: Insurance and Status: Insurance coverage has been reviewed Patient has primary care physician: Yes Home environment has been reviewed: Private Residence Prior level of function:: N/A Prior/Current Home Services: No current home services Social Drivers of Health Review: SDOH reviewed no interventions necessary Readmission risk has been reviewed: Yes (Currently Observation Status) Transition of care needs: no transition of care needs at this time

## 2024-07-23 ENCOUNTER — Other Ambulatory Visit (HOSPITAL_COMMUNITY): Payer: Self-pay

## 2024-07-23 ENCOUNTER — Encounter (HOSPITAL_COMMUNITY): Admission: EM | Disposition: A | Payer: Self-pay | Source: Home / Self Care | Attending: Emergency Medicine

## 2024-07-23 DIAGNOSIS — R079 Chest pain, unspecified: Secondary | ICD-10-CM | POA: Diagnosis not present

## 2024-07-23 DIAGNOSIS — R7989 Other specified abnormal findings of blood chemistry: Secondary | ICD-10-CM | POA: Diagnosis not present

## 2024-07-23 DIAGNOSIS — I200 Unstable angina: Secondary | ICD-10-CM | POA: Diagnosis not present

## 2024-07-23 DIAGNOSIS — R9389 Abnormal findings on diagnostic imaging of other specified body structures: Secondary | ICD-10-CM | POA: Diagnosis not present

## 2024-07-23 HISTORY — PX: LEFT HEART CATH AND CORONARY ANGIOGRAPHY: CATH118249

## 2024-07-23 LAB — PROTIME-INR
INR: 1 (ref 0.8–1.2)
Prothrombin Time: 13.7 s (ref 11.4–15.2)

## 2024-07-23 LAB — CBC
HCT: 35.2 % — ABNORMAL LOW (ref 39.0–52.0)
Hemoglobin: 11.3 g/dL — ABNORMAL LOW (ref 13.0–17.0)
MCH: 25.2 pg — ABNORMAL LOW (ref 26.0–34.0)
MCHC: 32.1 g/dL (ref 30.0–36.0)
MCV: 78.4 fL — ABNORMAL LOW (ref 80.0–100.0)
Platelets: 349 K/uL (ref 150–400)
RBC: 4.49 MIL/uL (ref 4.22–5.81)
RDW: 15.4 % (ref 11.5–15.5)
WBC: 6.3 K/uL (ref 4.0–10.5)
nRBC: 0 % (ref 0.0–0.2)

## 2024-07-23 LAB — BASIC METABOLIC PANEL WITH GFR
Anion gap: 10 (ref 5–15)
BUN: 24 mg/dL — ABNORMAL HIGH (ref 8–23)
CO2: 24 mmol/L (ref 22–32)
Calcium: 9.3 mg/dL (ref 8.9–10.3)
Chloride: 98 mmol/L (ref 98–111)
Creatinine, Ser: 1.03 mg/dL (ref 0.61–1.24)
GFR, Estimated: >60 mL/min
Glucose, Bld: 116 mg/dL — ABNORMAL HIGH (ref 70–99)
Potassium: 4.3 mmol/L (ref 3.5–5.1)
Sodium: 132 mmol/L — ABNORMAL LOW (ref 135–145)

## 2024-07-23 SURGERY — LEFT HEART CATH AND CORONARY ANGIOGRAPHY
Anesthesia: LOCAL

## 2024-07-23 MED ORDER — MIDAZOLAM HCL 2 MG/2ML IJ SOLN
INTRAMUSCULAR | Status: AC
  Filled 2024-07-23: qty 2

## 2024-07-23 MED ORDER — ATORVASTATIN CALCIUM 80 MG PO TABS
80.0000 mg | ORAL_TABLET | Freq: Every day | ORAL | 2 refills | Status: DC
  Filled 2024-07-23: qty 30, 30d supply, fill #0

## 2024-07-23 MED ORDER — HEPARIN SODIUM (PORCINE) 1000 UNIT/ML IJ SOLN
INTRAMUSCULAR | Status: DC | PRN
  Administered 2024-07-23: 4000 [IU] via INTRAVENOUS

## 2024-07-23 MED ORDER — ONDANSETRON HCL 4 MG/2ML IJ SOLN: 4.0000 mg | Freq: Four times a day (QID) | INTRAMUSCULAR | Status: DC | PRN

## 2024-07-23 MED ORDER — LABETALOL HCL 5 MG/ML IV SOLN: 10.0000 mg | INTRAVENOUS | Status: DC | PRN

## 2024-07-23 MED ORDER — CARVEDILOL 3.125 MG PO TABS
3.1250 mg | ORAL_TABLET | Freq: Two times a day (BID) | ORAL | 1 refills | Status: DC
  Filled 2024-07-23: qty 60, 30d supply, fill #0

## 2024-07-23 MED ORDER — IOHEXOL 350 MG/ML SOLN
INTRAVENOUS | Status: DC | PRN
  Administered 2024-07-23: 30 mL

## 2024-07-23 MED ORDER — SODIUM CHLORIDE 0.9 % IV SOLN: 250.0000 mL | INTRAVENOUS | Status: DC | PRN

## 2024-07-23 MED ORDER — ASPIRIN 81 MG PO CHEW
81.0000 mg | CHEWABLE_TABLET | ORAL | Status: AC
  Administered 2024-07-23: 81 mg via ORAL
  Filled 2024-07-23: qty 1

## 2024-07-23 MED ORDER — FENTANYL CITRATE (PF) 100 MCG/2ML IJ SOLN
INTRAMUSCULAR | Status: AC
  Filled 2024-07-23: qty 2

## 2024-07-23 MED ORDER — SODIUM CHLORIDE 0.9% FLUSH: 3.0000 mL | Freq: Two times a day (BID) | INTRAVENOUS | Status: DC

## 2024-07-23 MED ORDER — ACETAMINOPHEN 325 MG PO TABS: 650.0000 mg | ORAL_TABLET | ORAL | Status: DC | PRN

## 2024-07-23 MED ORDER — HEPARIN (PORCINE) IN NACL 2000-0.9 UNIT/L-% IV SOLN
INTRAVENOUS | Status: DC | PRN
  Administered 2024-07-23: 1000 mL

## 2024-07-23 MED ORDER — SACUBITRIL-VALSARTAN 24-26 MG PO TABS
1.0000 | ORAL_TABLET | Freq: Two times a day (BID) | ORAL | 1 refills | Status: DC
  Filled 2024-07-23: qty 60, 30d supply, fill #0

## 2024-07-23 MED ORDER — FENTANYL CITRATE (PF) 100 MCG/2ML IJ SOLN
INTRAMUSCULAR | Status: DC | PRN
  Administered 2024-07-23: 25 ug via INTRAVENOUS

## 2024-07-23 MED ORDER — HYDRALAZINE HCL 20 MG/ML IJ SOLN: 10.0000 mg | INTRAMUSCULAR | Status: DC | PRN

## 2024-07-23 MED ORDER — SODIUM CHLORIDE 0.9% FLUSH: 3.0000 mL | INTRAVENOUS | Status: DC | PRN

## 2024-07-23 MED ORDER — HEPARIN SODIUM (PORCINE) 1000 UNIT/ML IJ SOLN
INTRAMUSCULAR | Status: AC
  Filled 2024-07-23: qty 10

## 2024-07-23 MED ORDER — PANTOPRAZOLE SODIUM 40 MG PO TBEC
40.0000 mg | DELAYED_RELEASE_TABLET | Freq: Two times a day (BID) | ORAL | 0 refills | Status: DC
  Filled 2024-07-23: qty 30, 15d supply, fill #0

## 2024-07-23 MED ORDER — FREE WATER
250.0000 mL | Freq: Once | Status: AC
  Administered 2024-07-23: 250 mL via ORAL

## 2024-07-23 MED ORDER — LIDOCAINE HCL (PF) 1 % IJ SOLN
INTRAMUSCULAR | Status: AC
  Filled 2024-07-23: qty 30

## 2024-07-23 MED ORDER — EMPAGLIFLOZIN 10 MG PO TABS
10.0000 mg | ORAL_TABLET | Freq: Every day | ORAL | 2 refills | Status: DC
  Filled 2024-07-23: qty 30, 30d supply, fill #0

## 2024-07-23 MED ORDER — VERAPAMIL HCL 2.5 MG/ML IV SOLN
INTRAVENOUS | Status: AC
  Filled 2024-07-23: qty 2

## 2024-07-23 MED ORDER — VERAPAMIL HCL 2.5 MG/ML IV SOLN
INTRAVENOUS | Status: DC | PRN
  Administered 2024-07-23: 10 mL via INTRA_ARTERIAL

## 2024-07-23 MED ORDER — SPIRONOLACTONE 25 MG PO TABS
25.0000 mg | ORAL_TABLET | Freq: Every day | ORAL | 2 refills | Status: DC
  Filled 2024-07-23: qty 30, 30d supply, fill #0

## 2024-07-23 MED ORDER — FREE WATER
500.0000 mL | Freq: Once | Status: AC
  Administered 2024-07-23: 500 mL via ORAL

## 2024-07-23 MED ORDER — CLOPIDOGREL BISULFATE 75 MG PO TABS
75.0000 mg | ORAL_TABLET | Freq: Every day | ORAL | 3 refills | Status: DC
  Filled 2024-07-23: qty 30, 30d supply, fill #0

## 2024-07-23 MED ORDER — LIDOCAINE HCL (PF) 1 % IJ SOLN
INTRAMUSCULAR | Status: DC | PRN
  Administered 2024-07-23: 2 mL

## 2024-07-23 MED ORDER — MIDAZOLAM HCL (PF) 2 MG/2ML IJ SOLN
INTRAMUSCULAR | Status: DC | PRN
  Administered 2024-07-23: 1 mg via INTRAVENOUS

## 2024-07-23 SURGICAL SUPPLY — 8 items
CATH INFINITI AMBI 5FR TG (CATHETERS) IMPLANT
DEVICE RAD COMP TR BAND LRG (VASCULAR PRODUCTS) IMPLANT
GLIDESHEATH SLEND A-KIT 6F 22G (SHEATH) IMPLANT
GUIDEWIRE INQWIRE 1.5J.035X260 (WIRE) IMPLANT
KIT HEART LEFT (KITS) IMPLANT
PACK CARDIAC CATHETERIZATION (CUSTOM PROCEDURE TRAY) ×1 IMPLANT
TRANSDUCER W/STOPCOCK (MISCELLANEOUS) IMPLANT
TUBING CIL FLEX 10 FLL-RA (TUBING) IMPLANT

## 2024-07-23 NOTE — Progress Notes (Signed)
 [facility]  PROGRESS NOTE  Cameron Hoover FMW:[id number] DOB: Dec 20, 1955 DOA: 07/18/2024 PCP: Benjamin Raina Elizabeth, NP   Brief HPI:     68 y.o. male with history of CAD status post DES to mLAD in 2016 with history of bradycardia not on beta-blockers, chronic HFpEF prior history of upper GI bleed in 2017, history of CVA in 2023, hypertension, tobacco abuse, peripheral neuropathy and chronic back pain presents to the ER after patient started having sudden onset chest pain and shortness of breath at home last evening while he was trying to let his dogs go out.  Patient also acutely became diaphoretic.  Chest pain is retrosternal burning in sensation.  He took some Pepto-Bismol despite which the pain did not improve.  He went to his neighbor who was a engineer, civil (consulting) and was brought to the ER.    Assessment/Plan:    Chest pain with abnormal echo -Cardiology was consulted, no intervention recommended -Presented with chest pain; likely from GERD/peptic ulcer disease -Echocardiogram showed small apical left ventricular thrombus, cardiology was reconsulted - Echo:Unable to exclude a small apical left ventricular thrombus, consider cMRI for further evaluation.  - Underwent cardiac MRI, did not show LV thrombus; multiple wall motion abnormalities, finding consistent with ischemic cardiomyopathy.  Plan for cardiac catheter today   History of GERD - On PPI - Added Carafate    Anemia Hemoglobin stable - Will need close GI follow-up outpatient, if hemoglobin trends down or patient has GI bleeding will need inpatient evaluation   History of CVA On statin and Plavix    Tobacco abuse - Encourage cessation     DVT prophylaxis: Lovenox   Medications     atorvastatin   80 mg Oral Daily   clopidogrel   75 mg Oral Daily   empagliflozin   10 mg Oral Daily   enoxaparin  (LOVENOX ) injection  40 mg Subcutaneous Q24H   gabapentin   300 mg Oral TID   pantoprazole   40 mg Oral BID   sacubitril -valsartan    1 tablet Oral BID   spironolactone   25 mg Oral Daily   sucralfate   1 g Oral TID WC & HS     Data Reviewed:   CBG:  No results for input(s): GLUCAP in the last 168 hours.  SpO2: 99 % O2 Flow Rate (L/min): 2 L/min    Vitals:   07/22/24 1937 07/22/24 2311 07/23/24 0410 07/23/24 0751  BP: 118/60 110/63 116/64 (!) 105/58  Pulse: 65 73 66 68  Resp:  19 17 19   Temp: (!) 97.3 F (36.3 C) 97.9 F (36.6 C) 98.2 F (36.8 C) 98.4 F (36.9 C)  TempSrc: Oral Oral Oral Oral  SpO2: 94% 95% 95% 99%  Weight:   72.7 kg   Height:          Data Reviewed:  Basic Metabolic Panel: Recent Labs  Lab 07/18/24 2143 07/19/24 0509 07/23/24 0233  NA 137 136 132*  K 4.2 4.0 4.3  CL 104 104 98  CO2 25 24 24   GLUCOSE 92 92 116*  BUN 21 18 24*  CREATININE 1.00 0.91 1.03  CALCIUM  9.3 9.2 9.3    CBC: Recent Labs  Lab 07/18/24 2143 07/19/24 0509 07/20/24 0938 07/23/24 0233  WBC 10.5 7.2 6.0 6.3  NEUTROABS 8.3*  --   --   --   HGB 11.0* 10.5* 11.5* 11.3*  HCT 35.9* 34.2* 36.4* 35.2*  MCV 82.0 81.4 79.8* 78.4*  PLT 355 337 364 349    LFT Recent Labs  Lab 07/19/24 0509  AST  20  ALT 13  ALKPHOS 132*  BILITOT 0.5  PROT 7.2  ALBUMIN 4.0     Antibiotics: Anti-infectives (From admission, onward)    None        CONSULTS   Code Status: Full code  Family Communication: No family at bedside     Subjective   Denies chest pain or shortness of breath   Objective    Physical Examination:   General-appears in no acute distress Heart-S1-S2, regular, no murmur auscultated Lungs-clear to auscultation bilaterally, no wheezing or crackles auscultated Abdomen-soft, nontender, no organomegaly Extremities-no edema in the lower extremities Neuro-alert, oriented x3, no focal deficit noted           Dajae Kizer S Concetta Guion   [facility] If 7PM-7AM, please contact night-coverage at [email or web address], Office  [phone]   07/23/2024, 8:07 AM  LOS: 0 days

## 2024-07-23 NOTE — Discharge Summary (Addendum)
 " Physician  Discharge Summary   Patient: Cameron Hoover MRN: [id number] DOB: Nov 02, 1955  Admit date:     07/18/2024  Discharge date: 07/23/2024  Discharge Physician: Sabas GORMAN Brod   PCP: Benjamin Raina Elizabeth, NP   Recommendations at discharge:   Follow-up cardiology as outpatient  Discharge Diagnoses: Principal Problem:   Unstable angina (HCC) Active Problems:   Essential hypertension   Cardiomyopathy, ischemic   Coronary artery disease of native artery of native heart with stable angina pectoris   GERD (gastroesophageal reflux disease)   CVA (cerebral vascular accident) (HCC)   Dyspnea on exertion   Abnormal chest x-ray   Microcytic anemia   Chest pain due to GERD  Resolved Problems:   * No resolved hospital problems. *  Hospital Course: 68 y.o. male with history of CAD status post DES to mLAD in 2016 with history of bradycardia not on beta-blockers, chronic HFpEF prior history of upper GI bleed in 2017, history of CVA in 2023, hypertension, tobacco abuse, peripheral neuropathy and chronic back pain presents to the ER after patient started having sudden onset chest pain and shortness of breath at home last evening while he was trying to let his dogs go out.  Patient also acutely became diaphoretic.  Chest pain is retrosternal burning in sensation.  He took some Pepto-Bismol despite which the pain did not improve.  He went to his neighbor who was a engineer, civil (consulting) and was brought to the ER.     Assessment and Plan:   Chest pain with abnormal echo -Cardiology was consulted, no intervention recommended -Presented with chest pain; likely from GERD/peptic ulcer disease -Echocardiogram showed small apical left ventricular thrombus, cardiology was reconsulted - Echo:Unable to exclude a small apical left ventricular thrombus, consider cMRI for further evaluation.  - Underwent cardiac MRI, did not show LV thrombus; multiple wall motion abnormalities, finding consistent with ischemic  cardiomyopathy.     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 twice daily Plavix  75 mg daily Aldactone  25 mg daily Jardiance  10 mg daily Entresto  24/26 1 tablet p.o. twice daily Atorvastatin  80 mg daily  Patient to follow-up with cardiology as outpatient  GERD Continue PPI   Anemia Hemoglobin stable    History of CVA On statin and Plavix    Tobacco abuse - Encourage cessation      Consultants: Cardiology Procedures performed: Cardiac catheterization Disposition: Home Diet recommendation:  Cardiac diet DISCHARGE MEDICATION: Allergies as of 07/23/2024   No Known Allergies      Medication List     STOP taking these medications    lansoprazole  30 MG capsule Commonly known as: PREVACID  Replaced by: pantoprazole  40 MG tablet   olmesartan 5 MG tablet Commonly known as: BENICAR       TAKE these medications    atorvastatin  80 MG tablet Commonly known as: LIPITOR  Take 1 tablet (80 mg total) by mouth daily. Start taking on: July 24, 2024 What changed:  medication strength how much to take   carvedilol  3.125 MG tablet Commonly known as: Coreg  Take 1 tablet (3.125 mg total) by  mouth 2 (two) times daily.   clopidogrel  75 MG tablet Commonly known as: PLAVIX  Take 1 tablet (75 mg total) by mouth daily.   empagliflozin  10 MG Tabs tablet Commonly known as: JARDIANCE  Take 1 tablet (10 mg total) by mouth daily. Start taking on: July 24, 2024   gabapentin  300 MG capsule Commonly  known as: NEURONTIN  Take 300 mg by mouth 3 (three) times daily.   HYDROcodone -acetaminophen  5-325 MG tablet Commonly known as: NORCO/VICODIN Take 1 tablet by mouth every 12 (twelve) hours.   pantoprazole  40 MG tablet Commonly known as: PROTONIX  Take 1 tablet (40 mg total) by mouth 2 (two) times daily. Replaces: lansoprazole  30 MG capsule   sacubitril -valsartan  24-26 MG Commonly known as: ENTRESTO  Take 1 tablet by mouth 2 (two) times daily.   spironolactone  25 MG tablet Commonly known as: ALDACTONE  Take 1 tablet (25 mg total) by mouth daily. Start taking on: July 24, 2024        Follow-up Information     [facility]. Go in 6 day(s).   Specialty: Cardiology Why: [facility]ospita lfollow up 07/29/2024 @ 8:15 am PLEASE bring a current medication list to appointment FREE valet parking, Entrance C, off [employer] LOOK for women and [facility] entrance Contact information: [address] [place]  [phone]               Discharge Exam: Fredricka Weights   07/18/24 2357 07/19/24 1900 07/23/24 0410  Weight: 73.9 kg 73.4 kg 72.7 kg   General-appears in no acute distress Heart-S1-S2, regular, no murmur auscultated Lungs-clear to auscultation bilaterally, no wheezing or crackles auscultated Abdomen-soft, nontender, no organomegaly Extremities-no edema in the lower extremities Neuro-alert, oriented x3, no focal deficit noted  Condition at discharge: good  The results of significant diagnostics from this hospitalization (including imaging, microbiology, ancillary and laboratory) are listed below for reference.   Imaging Studies: CARDIAC CATHETERIZATION Result Date: 07/23/2024 Images from the original result were not included. 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 A dedicated cardiac coil was used. Functional imaging was done using TrueFisp sequences. 2,3, and 4 chamber views were done to assess for RWMA's. Modified Simpson's rule using a short axis stack was used to calculate an ejection fraction on a dedicated work [employer]. The patient received 10mL GADAVIST  GADOBUTROL  1 MMOL/ML IV SOLN. After 10 minutes inversion recovery sequences were used to assess for infiltration and scar tissue. Phase contrast velocity encoded images obtained x 2. This examination is tailored for evaluation cardiac anatomy and function and provides very limited assessment of noncardiac structures, which are accordingly not evaluated during interpretation. If there is clinical concern for extracardiac pathology, further evaluation with CT imaging should be considered. FINDINGS: LEFT VENTRICLE: Normal left ventricular size. Severely reduced  left ventricular function. Maximum septal wall thickness: 0.8 cm. Posterior wall thickness 0.7 cm. Left ventricular internal diameter (diastole): 5.7 cm. Hypokinesis of the basal inferior/inferolateral segments. Hypokinesis of the basal to mid anterior/anterolateral segments. Akinesis of the mid anterolateral segment. LV EF: 24% (Normal 49-79%) Absolute volumes: LV EDV: 192  mL (Normal 95-215 mL) LV ESV: 146 mL (Normal 25-85 mL) LV SV: 46 mL (Normal 61-145 mL) CO: 2.5 L/min (Normal 3.4-7.8 L/min) Indexed volumes: LV EDV: 104 mL/sq-m (Normal 50-108 mL/sq-m) LV ESV: 79 mL/sq-m (Normal 11-47 mL/sq-m) LV SV: 25 mL/sq-m (Normal 33-72 mL/sq-m) CI: 1.4 L/min/sq-m (Normal 1.8-4.2 L/min/sq-m) RIGHT VENTRICLE: Normal right ventricular chamber size. Normal right ventricular wall thickness. Mildly reduced right ventricular systolic function. There are no regional wall motion abnormalities. RV EF: 41% (Normal 51-80%) Absolute volumes: RV EDV: 110 mL (Normal 109-217 mL) RV ESV: 65 mL (Normal 23-91 mL) RV SV: 45 mL (Normal 71-141 mL) CO: 2.5 L/min (Normal 2.8-8.8 L/min) Indexed volumes: RV EDV: 59 mL/sq-m (Normal 58-109 mL/sq-m) RV ESV: 35 mL/sq-m (Normal 12-46 mL/sq-m) RV SV: 24 mL/sq-m (Normal 38-71 mL/sq-m) CI: 1.3 L/min/sq-m (Normal 1.7-4.2 L/min/sq-m) Left atrium: Normal size. Right atrium: Normal size. Mitral valve: Normal mitral valve without regurgitation. Aortic valve: Tricuspid aortic valve with moderate regurgitation (RF 23%). Tricuspid valve: Normal tricuspid valve without regurgitation. Pulmonic valve: Normal pulmonary valve without regurgitation. Aorta: The aortic root and proximal ascending aorta are normal in diameter. Pericardium: The pericardium is normal thickness. There is no pericardial effusion. Systemic flow across the aortic valve (QS) was 4.3 L/min. Pulmonary flow across the pulmonic valve (QP) was 2.9  L/min. The QP:QS calculated across the aortic and pulmonic valves was 0.8. Native myocardial T1-relaxation times were normal at 1005 ms. Native myocardial T2-relaxation times were normal at 47 ms. ECV was mildly abnormal at 32%. Delayed Enhancement: Subendocardial delayed enhancement of the basal inferior/inferolateral segments consistent with RCA infarction. LGE scar is <50% transmural indicating viable myocardium. Subendocardial delayed enhancement of the basal anterior  segment consistent with LAD infarction. LGE scar is <50% transmural indicating viable myocardium. Subendocardial delayed enhancement of the basal to mid anterolateral segment consistent with LCX infarction. LGE scar is >50% and transmural in the mid anterolateral segment indicating non-viable myocardium. There is no evidence of intracardiac thrombus. Extracardiac structures: No significant findings IMPRESSION: 1. The left ventricle is normal in cavity size and wall thickness. Systolic function was severely reduced, LVEF 24%. Hypokinesis of the basal inferior/inferolateral segments. Hypokinesis of the basal to mid anterior/anterolateral segments. Akinesis of the mid anterolateral segment. 2. The right ventricle is normal in cavity size with mildly reduced systolic function, RVEF 41%. 3. Tricuspid aortic valve with moderate regurgitation (RF 23%). 4. Evidence of basal inferior/inferolateral infarction (RCA) and this area is viable. Evidence of basal anterior infarction (LAD) and this area is viable. Evidence of basal to mid anterolateral infarction (LCX) and this area is not viable (>50% LGE and transmural infarction in the mid anterolateral segment. 5. No evidence of LV thrombus. 6. Findings consistent with ischemic cardiomyopathy (multivessel CAD) with viable and non-viable myocardium described above. Electronically Signed   By: Darryle Decent M.D.   On: 07/22/2024 13:32   MR CARDIAC VELOCITY FLOW MAP Result Date: 07/22/2024 CLINICAL DATA:  Cardiac thrombus or embolic source suspected Chest pain/anginal equiv, ECGs and troponins normal, evaluate LV thrombus EXAM: MR CARDIA MORPHOLOGY WITHOUT AND WITH CONTRAST; MR CARDIAC VELOCITY FLOW MAPPING TECHNIQUE: The patient was scanned on a 1.5 Tesla Siemens magnet. A dedicated cardiac coil was used.  Functional imaging was done using TrueFisp sequences. 2,3, and 4 chamber views were done to assess for RWMA's. Modified Simpson's rule using a short axis stack was used to  calculate an ejection fraction on a dedicated work [employer]. The patient received 10mL GADAVIST  GADOBUTROL  1 MMOL/ML IV SOLN. After 10 minutes inversion recovery sequences were used to assess for infiltration and scar tissue. Phase contrast velocity encoded images obtained x 2. This examination is tailored for evaluation cardiac anatomy and function and provides very limited assessment of noncardiac structures, which are accordingly not evaluated during interpretation. If there is clinical concern for extracardiac pathology, further evaluation with CT imaging should be considered. FINDINGS: LEFT VENTRICLE: Normal left ventricular size. Severely reduced left ventricular function. Maximum septal wall thickness: 0.8 cm. Posterior wall thickness 0.7 cm. Left ventricular internal diameter (diastole): 5.7 cm. Hypokinesis of the basal inferior/inferolateral segments. Hypokinesis of the basal to mid anterior/anterolateral segments. Akinesis of the mid anterolateral segment. LV EF: 24% (Normal 49-79%) Absolute volumes: LV EDV: 192 mL (Normal 95-215 mL) LV ESV: 146 mL (Normal 25-85 mL) LV SV: 46 mL (Normal 61-145 mL) CO: 2.5 L/min (Normal 3.4-7.8 L/min) Indexed volumes: LV EDV: 104 mL/sq-m (Normal 50-108 mL/sq-m) LV ESV: 79 mL/sq-m (Normal 11-47 mL/sq-m) LV SV: 25 mL/sq-m (Normal 33-72 mL/sq-m) CI: 1.4 L/min/sq-m (Normal 1.8-4.2 L/min/sq-m) RIGHT VENTRICLE: Normal right ventricular chamber size. Normal right ventricular wall thickness. Mildly reduced right ventricular systolic function. There are no regional wall motion abnormalities. RV EF: 41% (Normal 51-80%) Absolute volumes: RV EDV: 110 mL (Normal 109-217 mL) RV ESV: 65 mL (Normal 23-91 mL) RV SV: 45 mL (Normal 71-141 mL) CO: 2.5 L/min (Normal 2.8-8.8 L/min) Indexed volumes: RV EDV: 59 mL/sq-m (Normal 58-109 mL/sq-m) RV ESV: 35 mL/sq-m (Normal 12-46 mL/sq-m) RV SV: 24 mL/sq-m (Normal 38-71 mL/sq-m) CI: 1.3 L/min/sq-m (Normal 1.7-4.2 L/min/sq-m) Left  atrium: Normal size. Right atrium: Normal size. Mitral valve: Normal mitral valve without regurgitation. Aortic valve: Tricuspid aortic valve with moderate regurgitation (RF 23%). Tricuspid valve: Normal tricuspid valve without regurgitation. Pulmonic valve: Normal pulmonary valve without regurgitation. Aorta: The aortic root and proximal ascending aorta are normal in diameter. Pericardium: The pericardium is normal thickness. There is no pericardial effusion. Systemic flow across the aortic valve (QS) was 4.3 L/min. Pulmonary flow across the pulmonic valve (QP) was 2.9  L/min. The QP:QS calculated across the aortic and pulmonic valves was 0.8. Native myocardial T1-relaxation times were normal at 1005 ms. Native myocardial T2-relaxation times were normal at 47 ms. ECV was mildly abnormal at 32%. Delayed Enhancement: Subendocardial delayed enhancement of the basal inferior/inferolateral segments consistent with RCA infarction. LGE scar is <50% transmural indicating viable myocardium. Subendocardial delayed enhancement of the basal anterior segment consistent with LAD infarction. LGE scar is <50% transmural indicating viable myocardium. Subendocardial delayed enhancement of the basal to mid anterolateral segment consistent with LCX infarction. LGE scar is >50% and transmural in the mid anterolateral segment indicating non-viable myocardium. There is no evidence of intracardiac thrombus. Extracardiac structures: No significant findings IMPRESSION: 1. The left ventricle is normal in cavity size and wall thickness. Systolic function was severely reduced, LVEF 24%. Hypokinesis of the basal inferior/inferolateral segments. Hypokinesis of the basal to mid anterior/anterolateral segments. Akinesis of the mid anterolateral segment. 2. The right ventricle is normal in cavity size with mildly reduced systolic function, RVEF 41%. 3. Tricuspid aortic valve with moderate regurgitation (RF 23%). 4. Evidence of basal  inferior/inferolateral infarction (RCA) and this area is viable. Evidence of basal anterior  infarction (LAD) and this area is viable. Evidence of basal to mid anterolateral infarction (LCX) and this area is not viable (>50% LGE and transmural infarction in the mid anterolateral segment. 5. No evidence of LV thrombus. 6. Findings consistent with ischemic cardiomyopathy (multivessel CAD) with viable and non-viable myocardium described above. Electronically Signed   By: Darryle Decent M.D.   On: 07/22/2024 13:32   MR CARDIAC VELOCITY FLOW MAP Result Date: 07/22/2024 CLINICAL DATA:  Cardiac thrombus or embolic source suspected Chest pain/anginal equiv, ECGs and troponins normal, evaluate LV thrombus EXAM: MR CARDIA MORPHOLOGY WITHOUT AND WITH CONTRAST; MR CARDIAC VELOCITY FLOW MAPPING TECHNIQUE: The patient was scanned on a 1.5 Tesla Siemens magnet. A dedicated cardiac coil was used. Functional imaging was done using TrueFisp sequences. 2,3, and 4 chamber views were done to assess for RWMA's. Modified Simpson's rule using a short axis stack was used to calculate an ejection fraction on a dedicated work [employer]. The patient received 10mL GADAVIST  GADOBUTROL  1 MMOL/ML IV SOLN. After 10 minutes inversion recovery sequences were used to assess for infiltration and scar tissue. Phase contrast velocity encoded images obtained x 2. This examination is tailored for evaluation cardiac anatomy and function and provides very limited assessment of noncardiac structures, which are accordingly not evaluated during interpretation. If there is clinical concern for extracardiac pathology, further evaluation with CT imaging should be considered. FINDINGS: LEFT VENTRICLE: Normal left ventricular size. Severely reduced left ventricular function. Maximum septal wall thickness: 0.8 cm. Posterior wall thickness 0.7 cm. Left ventricular internal diameter (diastole): 5.7 cm. Hypokinesis of the basal  inferior/inferolateral segments. Hypokinesis of the basal to mid anterior/anterolateral segments. Akinesis of the mid anterolateral segment. LV EF: 24% (Normal 49-79%) Absolute volumes: LV EDV: 192 mL (Normal 95-215 mL) LV ESV: 146 mL (Normal 25-85 mL) LV SV: 46 mL (Normal 61-145 mL) CO: 2.5 L/min (Normal 3.4-7.8 L/min) Indexed volumes: LV EDV: 104 mL/sq-m (Normal 50-108 mL/sq-m) LV ESV: 79 mL/sq-m (Normal 11-47 mL/sq-m) LV SV: 25 mL/sq-m (Normal 33-72 mL/sq-m) CI: 1.4 L/min/sq-m (Normal 1.8-4.2 L/min/sq-m) RIGHT VENTRICLE: Normal right ventricular chamber size. Normal right ventricular wall thickness. Mildly reduced right ventricular systolic function. There are no regional wall motion abnormalities. RV EF: 41% (Normal 51-80%) Absolute volumes: RV EDV: 110 mL (Normal 109-217 mL) RV ESV: 65 mL (Normal 23-91 mL) RV SV: 45 mL (Normal 71-141 mL) CO: 2.5 L/min (Normal 2.8-8.8 L/min) Indexed volumes: RV EDV: 59 mL/sq-m (Normal 58-109 mL/sq-m) RV ESV: 35 mL/sq-m (Normal 12-46 mL/sq-m) RV SV: 24 mL/sq-m (Normal 38-71 mL/sq-m) CI: 1.3 L/min/sq-m (Normal 1.7-4.2 L/min/sq-m) Left atrium: Normal size. Right atrium: Normal size. Mitral valve: Normal mitral valve without regurgitation. Aortic valve: Tricuspid aortic valve with moderate regurgitation (RF 23%). Tricuspid valve: Normal tricuspid valve without regurgitation. Pulmonic valve: Normal pulmonary valve without regurgitation. Aorta: The aortic root and proximal ascending aorta are normal in diameter. Pericardium: The pericardium is normal thickness. There is no pericardial effusion. Systemic flow across the aortic valve (QS) was 4.3 L/min. Pulmonary flow across the pulmonic valve (QP) was 2.9  L/min. The QP:QS calculated across the aortic and pulmonic valves was 0.8. Native myocardial T1-relaxation times were normal at 1005 ms. Native myocardial T2-relaxation times were normal at 47 ms. ECV was mildly abnormal at 32%. Delayed Enhancement: Subendocardial delayed enhancement  of the basal inferior/inferolateral segments consistent with RCA infarction. LGE scar is <50% transmural indicating viable myocardium. Subendocardial delayed enhancement of the basal anterior segment consistent with LAD infarction. LGE scar is <50% transmural  indicating viable myocardium. Subendocardial delayed enhancement of the basal to mid anterolateral segment consistent with LCX infarction. LGE scar is >50% and transmural in the mid anterolateral segment indicating non-viable myocardium. There is no evidence of intracardiac thrombus. Extracardiac structures: No significant findings IMPRESSION: 1. The left ventricle is normal in cavity size and wall thickness. Systolic function was severely reduced, LVEF 24%. Hypokinesis of the basal inferior/inferolateral segments. Hypokinesis of the basal to mid anterior/anterolateral segments. Akinesis of the mid anterolateral segment. 2. The right ventricle is normal in cavity size with mildly reduced systolic function, RVEF 41%. 3. Tricuspid aortic valve with moderate regurgitation (RF 23%). 4. Evidence of basal inferior/inferolateral infarction (RCA) and this area is viable. Evidence of basal anterior infarction (LAD) and this area is viable. Evidence of basal to mid anterolateral infarction (LCX) and this area is not viable (>50% LGE and transmural infarction in the mid anterolateral segment. 5. No evidence of LV thrombus. 6. Findings consistent with ischemic cardiomyopathy (multivessel CAD) with viable and non-viable myocardium described above. Electronically Signed   By: Darryle Decent M.D.   On: 07/22/2024 13:32   ECHOCARDIOGRAM COMPLETE Result Date: 07/20/2024    ECHOCARDIOGRAM REPORT   Patient Name:   BORA BRONER Date of Exam: 07/20/2024 Medical Rec #:  [id number]     Height:       66.0 in Accession #:    [id number]    Weight:       161.8 lb Date of Birth:  [birth year]      BSA:          1.828 m Patient Age:    68 years      BP:           128/61 mmHg Patient  Gender: M             HR:           68 bpm. Exam Location:  Inpatient Procedure: 2D Echo, Cardiac Doppler, Color Doppler and Intracardiac            Opacification Agent (Both Spectral and Color Flow Doppler were            utilized during procedure). Indications:    Chest Pain R07.9  History:        Patient has prior history of Echocardiogram examinations, most                 recent 05/31/2022. Cardiomyopathy, CAD; Risk                 Factors:Hypertension, Dyslipidemia and Former Smoker.  Sonographer:    Merlynn Argyle Referring Phys: [id number] TAYLOR A PARCELLS IMPRESSIONS  1. Left ventricular ejection fraction, by estimation, is 35 to 40%. The left ventricle has moderately decreased function. The left ventricle demonstrates regional wall motion abnormalities (see scoring diagram/findings for description). Left ventricular  diastolic parameters are consistent with Grade I diastolic dysfunction (impaired relaxation). Elevated left ventricular end-diastolic pressure. The E/e' is 16.  2. Right ventricular systolic function is normal. The right ventricular size is normal.  3. The mitral valve is degenerative. Mild mitral valve regurgitation.  4. The aortic valve is calcified. Unable to determine aortic valve morphology due to image quality. Aortic valve regurgitation is moderate. Probable moderate low flow low gradient aortic stenosis (peak velocity 2.48m/s, MG , AVA VTI 1.12cm2, DI 0.36, SVi 32).  5. The inferior vena cava is normal in size with greater than 50% respiratory variability, suggesting right atrial pressure of 3 mmHg. Comparison(s):  A prior study was performed on 05/31/2022. LVEF 50%, Grade I diastolic dysfunction, Mild AS and AR. Conclusion(s)/Recommendation(s): Unable to exclude a small apical left ventricular thrombus, consider cMRI for further evaluation. FINDINGS  Left Ventricle: Left ventricular ejection fraction, by estimation, is 35 to 40%. The left ventricle has moderately decreased function. The  left ventricle demonstrates regional wall motion abnormalities. Definity  contrast agent was given IV to delineate the left ventricular endocardial borders. The left ventricular internal cavity size was normal in size. There is no left ventricular hypertrophy. Left ventricular diastolic parameters are consistent with Grade I diastolic dysfunction (impaired relaxation). Elevated left ventricular end-diastolic pressure. The E/e' is 16.  LV Wall Scoring: The entire lateral wall, entire inferior wall, and apex are hypokinetic. Right Ventricle: The right ventricular size is normal. Right vetricular wall thickness was not well visualized. Right ventricular systolic function is normal. Left Atrium: Left atrial size was normal in size. Right Atrium: Right atrial size was normal in size. Pericardium: There is no evidence of pericardial effusion. Mitral Valve: The mitral valve is degenerative in appearance. Mild mitral valve regurgitation. Tricuspid Valve: The tricuspid valve is grossly normal. Tricuspid valve regurgitation is not demonstrated. Aortic Valve: The aortic valve is calcified. Aortic valve regurgitation is moderate. Probable moderate low flow low gradient aortic stenosis (peak velocity 2.67m/s, MG , AVA VTI 1.12cm2, DI 0.36, SVi 32). Aortic valve mean gradient measures 11.5 mmHg. Aortic valve peak gradient measures 20.6 mmHg. Aortic valve area, by VTI measures 1.12 cm. Pulmonic Valve: The pulmonic valve was not well visualized. Pulmonic valve regurgitation is not visualized. Aorta: The aortic root and ascending aorta are structurally normal, with no evidence of dilitation. Venous: The inferior vena cava is normal in size with greater than 50% respiratory variability, suggesting right atrial pressure of 3 mmHg. IAS/Shunts: The atrial septum is grossly normal.  LEFT VENTRICLE PLAX 2D LVIDd:         5.20 cm      Diastology LVIDs:         4.20 cm      LV e' medial:    5.33 cm/s LV PW:         1.10 cm      LV E/e'  medial:  20.5 LV IVS:        1.10 cm      LV e' lateral:   9.36 cm/s LVOT diam:     2.00 cm      LV E/e' lateral: 11.6 LV SV:         59 LV SV Index:   32 LVOT Area:     3.14 cm  LV Volumes (MOD) LV vol d, MOD A2C: 154.0 ml LV vol d, MOD A4C: 145.0 ml LV vol s, MOD A2C: 75.9 ml LV vol s, MOD A4C: 72.5 ml LV SV MOD A2C:     78.1 ml LV SV MOD A4C:     145.0 ml LV SV MOD BP:      79.3 ml RIGHT VENTRICLE             IVC RV Basal diam:  3.50 cm     IVC diam: 1.30 cm RV S prime:     11.32 cm/s TAPSE (M-mode): 1.8 cm      PULMONARY VEINS                             Diastolic Velocity: 34.90 cm/s  S/D Velocity:       0.70                             Systolic Velocity:  24.60 cm/s LEFT ATRIUM             Index        RIGHT ATRIUM           Index LA diam:        4.40 cm 2.41 cm/m   RA Area:     12.90 cm LA Vol (A2C):   39.1 ml 21.39 ml/m  RA Volume:   34.60 ml  18.93 ml/m LA Vol (A4C):   37.0 ml 20.24 ml/m LA Biplane Vol: 38.8 ml 21.23 ml/m  AORTIC VALVE AV Area (Vmax):    1.21 cm AV Area (Vmean):   1.05 cm AV Area (VTI):     1.12 cm AV Vmax:           227.00 cm/s AV Vmean:          159.500 cm/s AV VTI:            0.525 m AV Peak Grad:      20.6 mmHg AV Mean Grad:      11.5 mmHg LVOT Vmax:         87.55 cm/s LVOT Vmean:        53.150 cm/s LVOT VTI:          0.188 m LVOT/AV VTI ratio: 0.36  AORTA Ao Root diam: 3.20 cm Ao Asc diam:  3.10 cm MITRAL VALVE MV Area (PHT): 4.86 cm     SHUNTS MV Decel Time: 156 msec     Systemic VTI:  0.19 m MV E velocity: 109.00 cm/s  Systemic Diam: 2.00 cm MV A velocity: 92.20 cm/s MV E/A ratio:  1.18 Sunit Tolia Electronically signed by Madonna Large Signature Date/Time: 07/20/2024/12:18:43 PM    Final    CT CHEST WO CONTRAST Result Date: 07/19/2024 CLINICAL DATA:  Shortness of breath. Pneumonia with complication suspected. Ex-smoker. Known congestive heart failure. EXAM: CT CHEST WITHOUT CONTRAST TECHNIQUE: Multidetector CT imaging of the chest was  performed following the standard protocol without IV contrast. RADIATION DOSE REDUCTION: This exam was performed according to the departmental dose-optimization program which includes automated exposure control, adjustment of the mA and/or kV according to patient size and/or use of iterative reconstruction technique. COMPARISON:  Chest radiograph of 1 day prior. CT of the chest of 06/10/2023. FINDINGS: Cardiovascular: Aortic atherosclerosis. Tortuous thoracic aorta. Mild cardiomegaly, without pericardial effusion. Left main and 3 vessel coronary artery calcification. Mediastinum/Nodes: No axillary adenopathy. Mediastinal adenopathy again identified. Right paratracheal index node measures 1.2 cm on 50/3 and is similar. Subcarinal node measures 1.8 cm on 74/3 versus 1.5 cm on the prior. Hilar regions poorly evaluated without intravenous contrast. Suspect left infrahilar adenopathy at 1.2 cm on 80/3, relatively similar. Lungs/Pleura: No pleural fluid.  Moderate centrilobular emphysema. Smooth septal thickening is most apparent at the lung bases, including in the left lower lobe on image 111/4. Millimetric bilateral pulmonary nodules are similar including in the inferior right upper lobe at 4 mm on image 75/4. Upper Abdomen: Normal imaged portions of the liver, spleen, stomach, pancreas, gallbladder, adrenal glands, kidneys. Abdominal aortic atherosclerosis. Musculoskeletal: Midthoracic spondylosis. IMPRESSION: 1. No evidence of pneumonia. 2. Subtle smooth septal thickening, most apparent at the lung bases. This suggests mild fluid overload superimposed upon emphysema. 3. Thoracic adenopathy which  is chronic and primarily similar. Most likely reactive. 4. Aortic valvular calcifications. Consider echocardiography to evaluate for valvular dysfunction. 5. Aortic atherosclerosis (ICD10-I70.0), coronary artery atherosclerosis and emphysema (ICD10-J43.9). Electronically Signed   By: Rockey Kilts M.D.   On: 07/19/2024 14:30    DG Chest 2 View Result Date: 07/18/2024 EXAM: 2 VIEW(S) XRAY OF THE CHEST 07/18/2024 09:56:37 PM COMPARISON: None available. CLINICAL HISTORY: CP SHOB FINDINGS: LUNGS AND PLEURA: Chronic coarsened interstitial markings without pulmonary edema. No focal pulmonary opacity. No pleural effusion. No pneumothorax. HEART AND MEDIASTINUM: Aortic calcification. No acute abnormality of the cardiac and mediastinal silhouettes. BONES AND SOFT TISSUES: No acute osseous abnormality. IMPRESSION: 1. No acute findings. 2. Chronic coarsened interstitial markings without pulmonary edema. Electronically signed by: Oneil Devonshire MD 07/18/2024 10:03 PM EST RP Workstation: [id number]    Microbiology: Results for orders placed or performed during the hospital encounter of 07/18/24  MRSA Next Gen by PCR, Nasal     Status: None   Collection Time: 07/19/24  7:11 PM   Specimen: Nasal Mucosa; Nasal Swab  Result Value Ref Range Status   MRSA by PCR Next Gen NOT DETECTED NOT DETECTED Final    Comment: (NOTE) The GeneXpert MRSA Assay (FDA approved for NASAL specimens only), is one component of a comprehensive MRSA colonization surveillance program. It is not intended to diagnose MRSA infection nor to guide or monitor treatment for MRSA infections. Test performance is not FDA approved in patients less than 71 years old. Performed at [facility], [address] [place], [place] [address]     Labs: CBC: Recent Labs  Lab 07/18/24 2143 07/19/24 0509 07/20/24 0938 07/23/24 0233  WBC 10.5 7.2 6.0 6.3  NEUTROABS 8.3*  --   --   --   HGB 11.0* 10.5* 11.5* 11.3*  HCT 35.9* 34.2* 36.4* 35.2*  MCV 82.0 81.4 79.8* 78.4*  PLT 355 337 364 349   Basic Metabolic Panel: Recent Labs  Lab 07/18/24 2143 07/19/24 0509 07/23/24 0233  NA 137 136 132*  K 4.2 4.0 4.3  CL 104 104 98  CO2 25 24 24   GLUCOSE 92 92 116*  BUN 21 18 24*  CREATININE 1.00 0.91 1.03  CALCIUM  9.3 9.2 9.3   Liver Function Tests: Recent  Labs  Lab 07/19/24 0509  AST 20  ALT 13  ALKPHOS 132*  BILITOT 0.5  PROT 7.2  ALBUMIN 4.0   CBG: No results for input(s): GLUCAP in the last 168 hours.  Discharge time spent: greater than 30 minutes.  Signed: Sabas GORMAN Brod, MD [facility] 07/23/2024 "

## 2024-07-23 NOTE — Progress Notes (Signed)
" ° °  Heart Failure Stewardship Pharmacist Progress Note   PCP: Benjamin Raina Elizabeth, NP PCP-Cardiologist: Madonna Large, DO    HPI:  68 yo M with PMH of CAD, bradycardia, CHF, GIB 2017, CVA 2023, HTN, tobacco use, peripheral neuropathy, and chronic back pain.  Presented to the ED on 12/18 with chest pain and shortness of breath associated with diaphoresis. Trop 36>29. EKG NSR. CXR unremarkable. proBNP 150. ECHO 12/20 with LVEF reduced to 35-40%, RWMA, G1DD, RV normal, moderate low flow gradient aortic stenosis, possible LV thrombus. cMRI 12/22 with LVEF 24%, RVEF 41%, evidence of basal inferior/inferolateral infarction RCA and this area is viable. Evidence of basal anterior infarction (LAD) and this area is viable. Evidence of basal to mid anterolateral infarction (LCx) and this area is not viable. No evidence of LV thrombosis. Findings consistent with ICM (multivessel CAD) with viable and nonviable myocardium. Scheduled for LHC today.   Denies chest pain, shortness of breath, lightheadedness, or dizziness. No LE edema. Reviewed changes to GDMT and goals of therapy. Agreeable to using [facility] TOC pharmacy at discharge. States that his insurance will be changing starting 1/1 to [facility].    Current HF Medications: ACE/ARB/ARNI: Entresto  24/26 mg BID MRA: spironolactone  25 mg daily SGLT2i: Jardiance  10 mg daily  Prior to admission HF Medications: ACE/ARB/ARNI: olmesartan 5 mg daily  Pertinent Lab Values: 12/19: Serum creatinine 1.03, BUN 24, Potassium 4.3, Sodium 132, proBNP 150  Vital Signs: Weight: 160 lbs (admission weight: 163 lbs) Blood pressure: 110/50s  Heart rate: 60s  I/O: incomplete  Medication Assistance / Insurance Benefits Check: Does the patient have prescription insurance?  Yes Type of insurance plan: [employer]  Outpatient Pharmacy:  Prior to admission outpatient pharmacy: [facility] Is the patient willing to use [facility] TOC pharmacy at discharge?  Yes Is the patient willing to transition their outpatient pharmacy to utilize a [facility] outpatient pharmacy?   No    Assessment: 1. Acute on chronic systolic CHF (LVEF 35-40%) due to ICM, pending LHC on 12/23. NYHA class II symptoms. - No BB with bradycardia - Continue Entresto  24/26 mg BID - Continue spironolactone  25 mg daily - Continue Jardiance  10 mg daily    Plan: 1) Medication changes recommended at this time: - Continue current regimen  2) Patient assistance: - Entresto  copay $4.90 - Farxiga copay $12.15 Jardiance  copay $12.15 - Reports affordability for copays now, insurance changing on 1/1 to [facility]  3)  Education  - Patient has been educated on current HF medications and potential additions to HF medication regimen - Patient verbalizes understanding that over the next few months, these medication doses may change and more medications may be added to optimize HF regimen - Patient has been educated on basic disease state pathophysiology and goals of therapy   Cameron Hoover, PharmD, BCPS Heart Failure Stewardship Pharmacist Phone [phone]   "

## 2024-07-23 NOTE — Progress Notes (Addendum)
 "  Rounding Note   Patient Name: Cameron Hoover Date of Encounter: 07/23/2024  [facility] Cardiologist: Madonna Large, DO   Subjective Patient admitted with chest pain and shortness of breath.  No longer has chest pain.  Shortness of breath has improved.  Scheduled Meds:  atorvastatin   80 mg Oral Daily   clopidogrel   75 mg Oral Daily   empagliflozin   10 mg Oral Daily   enoxaparin  (LOVENOX ) injection  40 mg Subcutaneous Q24H   gabapentin   300 mg Oral TID   pantoprazole   40 mg Oral BID   sacubitril -valsartan   1 tablet Oral BID   spironolactone   25 mg Oral Daily   sucralfate   1 g Oral TID WC & HS   Continuous Infusions:  PRN Meds: acetaminophen  **OR** acetaminophen , HYDROcodone -acetaminophen    Vital Signs  Vitals:   07/22/24 1937 07/22/24 2311 07/23/24 0410 07/23/24 0751  BP: 118/60 110/63 116/64 (!) 105/58  Pulse: 65 73 66 68  Resp:  19 17 19   Temp: (!) 97.3 F (36.3 C) 97.9 F (36.6 C) 98.2 F (36.8 C) 98.4 F (36.9 C)  TempSrc: Oral Oral Oral Oral  SpO2: 94% 95% 95% 99%  Weight:   72.7 kg   Height:        Intake/Output Summary (Last 24 hours) at 07/23/2024 0758 Last data filed at 07/23/2024 0419 Gross per 24 hour  Intake 730 ml  Output --  Net 730 ml      07/23/2024    4:10 AM 07/19/2024    7:00 PM 07/18/2024   11:57 PM  Last 3 Weights  Weight (lbs) 160 lb 3.2 oz 161 lb 13.1 oz 163 lb  Weight (kg) 72.666 kg 73.4 kg 73.936 kg      Telemetry Sinus rhythm- Personally Reviewed  ECG  Not performed today- Personally Reviewed  Physical Exam  GEN: No acute distress.   Neck: No JVD, soft bilateral carotid bruits Cardiac: RRR, soft outflow tract murmur, rubs, or gallops.  Respiratory: Clear to auscultation bilaterally. GI: Soft, nontender, non-distended  MS: No edema; No deformity. Neuro:  Nonfocal  Psych: Normal affect   Labs High Sensitivity Troponin:  No results for input(s): TROPONINIHS in the last 720 hours.  Recent Labs  Lab  07/18/24 2143 07/19/24 0300 07/19/24 0509  TRNPT 36* 29* 31*       Chemistry Recent Labs  Lab 07/18/24 2143 07/19/24 0509 07/23/24 0233  NA 137 136 132*  K 4.2 4.0 4.3  CL 104 104 98  CO2 25 24 24   GLUCOSE 92 92 116*  BUN 21 18 24*  CREATININE 1.00 0.91 1.03  CALCIUM  9.3 9.2 9.3  PROT  --  7.2  --   ALBUMIN  --  4.0  --   AST  --  20  --   ALT  --  13  --   ALKPHOS  --  132*  --   BILITOT  --  0.5  --   GFRNONAA >60 >60 >60  ANIONGAP 9 8 10     Lipids No results for input(s): CHOL, TRIG, HDL, LABVLDL, LDLCALC, CHOLHDL in the last 168 hours.  Hematology Recent Labs  Lab 07/19/24 0509 07/20/24 0938 07/23/24 0233  WBC 7.2 6.0 6.3  RBC 4.20*  4.15* 4.56 4.49  HGB 10.5* 11.5* 11.3*  HCT 34.2* 36.4* 35.2*  MCV 81.4 79.8* 78.4*  MCH 25.0* 25.2* 25.2*  MCHC 30.7 31.6 32.1  RDW 15.8* 15.9* 15.4  PLT 337 364 349   Thyroid  Recent Labs  Lab 07/19/24 0509  TSH 1.040    BNP Recent Labs  Lab 07/19/24 1242  PROBNP 150.0    DDimer  Recent Labs  Lab 07/19/24 0509  DDIMER 0.46     Radiology  MR CARDIAC MORPHOLOGY W WO CONTRAST Result Date: 07/22/2024 CLINICAL DATA:  Cardiac thrombus or embolic source suspected Chest pain/anginal equiv, ECGs and troponins normal, evaluate LV thrombus EXAM: MR CARDIA MORPHOLOGY WITHOUT AND WITH CONTRAST; MR CARDIAC VELOCITY FLOW MAPPING TECHNIQUE: The patient was scanned on a 1.5 Tesla Siemens magnet. A dedicated cardiac coil was used. Functional imaging was done using TrueFisp sequences. 2,3, and 4 chamber views were done to assess for RWMA's. Modified Simpson's rule using a short axis stack was used to calculate an ejection fraction on a dedicated work [employer]. The patient received 10mL GADAVIST  GADOBUTROL  1 MMOL/ML IV SOLN. After 10 minutes inversion recovery sequences were used to assess for infiltration and scar tissue. Phase contrast velocity encoded images obtained x 2. This examination is  tailored for evaluation cardiac anatomy and function and provides very limited assessment of noncardiac structures, which are accordingly not evaluated during interpretation. If there is clinical concern for extracardiac pathology, further evaluation with CT imaging should be considered. FINDINGS: LEFT VENTRICLE: Normal left ventricular size. Severely reduced left ventricular function. Maximum septal wall thickness: 0.8 cm. Posterior wall thickness 0.7 cm. Left ventricular internal diameter (diastole): 5.7 cm. Hypokinesis of the basal inferior/inferolateral segments. Hypokinesis of the basal to mid anterior/anterolateral segments. Akinesis of the mid anterolateral segment. LV EF: 24% (Normal 49-79%) Absolute volumes: LV EDV: 192 mL (Normal 95-215 mL) LV ESV: 146 mL (Normal 25-85 mL) LV SV: 46 mL (Normal 61-145 mL) CO: 2.5 L/min (Normal 3.4-7.8 L/min) Indexed volumes: LV EDV: 104 mL/sq-m (Normal 50-108 mL/sq-m) LV ESV: 79 mL/sq-m (Normal 11-47 mL/sq-m) LV SV: 25 mL/sq-m (Normal 33-72 mL/sq-m) CI: 1.4 L/min/sq-m (Normal 1.8-4.2 L/min/sq-m) RIGHT VENTRICLE: Normal right ventricular chamber size. Normal right ventricular wall thickness. Mildly reduced right ventricular systolic function. There are no regional wall motion abnormalities. RV EF: 41% (Normal 51-80%) Absolute volumes: RV EDV: 110 mL (Normal 109-217 mL) RV ESV: 65 mL (Normal 23-91 mL) RV SV: 45 mL (Normal 71-141 mL) CO: 2.5 L/min (Normal 2.8-8.8 L/min) Indexed volumes: RV EDV: 59 mL/sq-m (Normal 58-109 mL/sq-m) RV ESV: 35 mL/sq-m (Normal 12-46 mL/sq-m) RV SV: 24 mL/sq-m (Normal 38-71 mL/sq-m) CI: 1.3 L/min/sq-m (Normal 1.7-4.2 L/min/sq-m) Left atrium: Normal size. Right atrium: Normal size. Mitral valve: Normal mitral valve without regurgitation. Aortic valve: Tricuspid aortic valve with moderate regurgitation (RF 23%). Tricuspid valve: Normal tricuspid valve without regurgitation. Pulmonic valve: Normal pulmonary valve without regurgitation. Aorta: The  aortic root and proximal ascending aorta are normal in diameter. Pericardium: The pericardium is normal thickness. There is no pericardial effusion. Systemic flow across the aortic valve (QS) was 4.3 L/min. Pulmonary flow across the pulmonic valve (QP) was 2.9  L/min. The QP:QS calculated across the aortic and pulmonic valves was 0.8. Native myocardial T1-relaxation times were normal at 1005 ms. Native myocardial T2-relaxation times were normal at 47 ms. ECV was mildly abnormal at 32%. Delayed Enhancement: Subendocardial delayed enhancement of the basal inferior/inferolateral segments consistent with RCA infarction. LGE scar is <50% transmural indicating viable myocardium. Subendocardial delayed enhancement of the basal anterior segment consistent with LAD infarction. LGE scar is <50% transmural indicating viable myocardium. Subendocardial delayed enhancement of the basal to mid anterolateral segment consistent with LCX infarction. LGE scar is >50% and transmural in the  mid anterolateral segment indicating non-viable myocardium. There is no evidence of intracardiac thrombus. Extracardiac structures: No significant findings IMPRESSION: 1. The left ventricle is normal in cavity size and wall thickness. Systolic function was severely reduced, LVEF 24%. Hypokinesis of the basal inferior/inferolateral segments. Hypokinesis of the basal to mid anterior/anterolateral segments. Akinesis of the mid anterolateral segment. 2. The right ventricle is normal in cavity size with mildly reduced systolic function, RVEF 41%. 3. Tricuspid aortic valve with moderate regurgitation (RF 23%). 4. Evidence of basal inferior/inferolateral infarction (RCA) and this area is viable. Evidence of basal anterior infarction (LAD) and this area is viable. Evidence of basal to mid anterolateral infarction (LCX) and this area is not viable (>50% LGE and transmural infarction in the mid anterolateral segment. 5. No evidence of LV thrombus. 6. Findings  consistent with ischemic cardiomyopathy (multivessel CAD) with viable and non-viable myocardium described above. Electronically Signed   By: Darryle Decent M.D.   On: 07/22/2024 13:32   MR CARDIAC VELOCITY FLOW MAP Result Date: 07/22/2024 CLINICAL DATA:  Cardiac thrombus or embolic source suspected Chest pain/anginal equiv, ECGs and troponins normal, evaluate LV thrombus EXAM: MR CARDIA MORPHOLOGY WITHOUT AND WITH CONTRAST; MR CARDIAC VELOCITY FLOW MAPPING TECHNIQUE: The patient was scanned on a 1.5 Tesla Siemens magnet. A dedicated cardiac coil was used. Functional imaging was done using TrueFisp sequences. 2,3, and 4 chamber views were done to assess for RWMA's. Modified Simpson's rule using a short axis stack was used to calculate an ejection fraction on a dedicated work [employer]. The patient received 10mL GADAVIST  GADOBUTROL  1 MMOL/ML IV SOLN. After 10 minutes inversion recovery sequences were used to assess for infiltration and scar tissue. Phase contrast velocity encoded images obtained x 2. This examination is tailored for evaluation cardiac anatomy and function and provides very limited assessment of noncardiac structures, which are accordingly not evaluated during interpretation. If there is clinical concern for extracardiac pathology, further evaluation with CT imaging should be considered. FINDINGS: LEFT VENTRICLE: Normal left ventricular size. Severely reduced left ventricular function. Maximum septal wall thickness: 0.8 cm. Posterior wall thickness 0.7 cm. Left ventricular internal diameter (diastole): 5.7 cm. Hypokinesis of the basal inferior/inferolateral segments. Hypokinesis of the basal to mid anterior/anterolateral segments. Akinesis of the mid anterolateral segment. LV EF: 24% (Normal 49-79%) Absolute volumes: LV EDV: 192 mL (Normal 95-215 mL) LV ESV: 146 mL (Normal 25-85 mL) LV SV: 46 mL (Normal 61-145 mL) CO: 2.5 L/min (Normal 3.4-7.8 L/min) Indexed volumes: LV EDV: 104  mL/sq-m (Normal 50-108 mL/sq-m) LV ESV: 79 mL/sq-m (Normal 11-47 mL/sq-m) LV SV: 25 mL/sq-m (Normal 33-72 mL/sq-m) CI: 1.4 L/min/sq-m (Normal 1.8-4.2 L/min/sq-m) RIGHT VENTRICLE: Normal right ventricular chamber size. Normal right ventricular wall thickness. Mildly reduced right ventricular systolic function. There are no regional wall motion abnormalities. RV EF: 41% (Normal 51-80%) Absolute volumes: RV EDV: 110 mL (Normal 109-217 mL) RV ESV: 65 mL (Normal 23-91 mL) RV SV: 45 mL (Normal 71-141 mL) CO: 2.5 L/min (Normal 2.8-8.8 L/min) Indexed volumes: RV EDV: 59 mL/sq-m (Normal 58-109 mL/sq-m) RV ESV: 35 mL/sq-m (Normal 12-46 mL/sq-m) RV SV: 24 mL/sq-m (Normal 38-71 mL/sq-m) CI: 1.3 L/min/sq-m (Normal 1.7-4.2 L/min/sq-m) Left atrium: Normal size. Right atrium: Normal size. Mitral valve: Normal mitral valve without regurgitation. Aortic valve: Tricuspid aortic valve with moderate regurgitation (RF 23%). Tricuspid valve: Normal tricuspid valve without regurgitation. Pulmonic valve: Normal pulmonary valve without regurgitation. Aorta: The aortic root and proximal ascending aorta are normal in diameter. Pericardium: The pericardium is normal thickness. There  is no pericardial effusion. Systemic flow across the aortic valve (QS) was 4.3 L/min. Pulmonary flow across the pulmonic valve (QP) was 2.9  L/min. The QP:QS calculated across the aortic and pulmonic valves was 0.8. Native myocardial T1-relaxation times were normal at 1005 ms. Native myocardial T2-relaxation times were normal at 47 ms. ECV was mildly abnormal at 32%. Delayed Enhancement: Subendocardial delayed enhancement of the basal inferior/inferolateral segments consistent with RCA infarction. LGE scar is <50% transmural indicating viable myocardium. Subendocardial delayed enhancement of the basal anterior segment consistent with LAD infarction. LGE scar is <50% transmural indicating viable myocardium. Subendocardial delayed enhancement of the basal to mid  anterolateral segment consistent with LCX infarction. LGE scar is >50% and transmural in the mid anterolateral segment indicating non-viable myocardium. There is no evidence of intracardiac thrombus. Extracardiac structures: No significant findings IMPRESSION: 1. The left ventricle is normal in cavity size and wall thickness. Systolic function was severely reduced, LVEF 24%. Hypokinesis of the basal inferior/inferolateral segments. Hypokinesis of the basal to mid anterior/anterolateral segments. Akinesis of the mid anterolateral segment. 2. The right ventricle is normal in cavity size with mildly reduced systolic function, RVEF 41%. 3. Tricuspid aortic valve with moderate regurgitation (RF 23%). 4. Evidence of basal inferior/inferolateral infarction (RCA) and this area is viable. Evidence of basal anterior infarction (LAD) and this area is viable. Evidence of basal to mid anterolateral infarction (LCX) and this area is not viable (>50% LGE and transmural infarction in the mid anterolateral segment. 5. No evidence of LV thrombus. 6. Findings consistent with ischemic cardiomyopathy (multivessel CAD) with viable and non-viable myocardium described above. Electronically Signed   By: Darryle Decent M.D.   On: 07/22/2024 13:32   MR CARDIAC VELOCITY FLOW MAP Result Date: 07/22/2024 CLINICAL DATA:  Cardiac thrombus or embolic source suspected Chest pain/anginal equiv, ECGs and troponins normal, evaluate LV thrombus EXAM: MR CARDIA MORPHOLOGY WITHOUT AND WITH CONTRAST; MR CARDIAC VELOCITY FLOW MAPPING TECHNIQUE: The patient was scanned on a 1.5 Tesla Siemens magnet. A dedicated cardiac coil was used. Functional imaging was done using TrueFisp sequences. 2,3, and 4 chamber views were done to assess for RWMA's. Modified Simpson's rule using a short axis stack was used to calculate an ejection fraction on a dedicated work [employer]. The patient received 10mL GADAVIST  GADOBUTROL  1 MMOL/ML IV SOLN. After  10 minutes inversion recovery sequences were used to assess for infiltration and scar tissue. Phase contrast velocity encoded images obtained x 2. This examination is tailored for evaluation cardiac anatomy and function and provides very limited assessment of noncardiac structures, which are accordingly not evaluated during interpretation. If there is clinical concern for extracardiac pathology, further evaluation with CT imaging should be considered. FINDINGS: LEFT VENTRICLE: Normal left ventricular size. Severely reduced left ventricular function. Maximum septal wall thickness: 0.8 cm. Posterior wall thickness 0.7 cm. Left ventricular internal diameter (diastole): 5.7 cm. Hypokinesis of the basal inferior/inferolateral segments. Hypokinesis of the basal to mid anterior/anterolateral segments. Akinesis of the mid anterolateral segment. LV EF: 24% (Normal 49-79%) Absolute volumes: LV EDV: 192 mL (Normal 95-215 mL) LV ESV: 146 mL (Normal 25-85 mL) LV SV: 46 mL (Normal 61-145 mL) CO: 2.5 L/min (Normal 3.4-7.8 L/min) Indexed volumes: LV EDV: 104 mL/sq-m (Normal 50-108 mL/sq-m) LV ESV: 79 mL/sq-m (Normal 11-47 mL/sq-m) LV SV: 25 mL/sq-m (Normal 33-72 mL/sq-m) CI: 1.4 L/min/sq-m (Normal 1.8-4.2 L/min/sq-m) RIGHT VENTRICLE: Normal right ventricular chamber size. Normal right ventricular wall thickness. Mildly reduced right ventricular systolic function. There are no regional  wall motion abnormalities. RV EF: 41% (Normal 51-80%) Absolute volumes: RV EDV: 110 mL (Normal 109-217 mL) RV ESV: 65 mL (Normal 23-91 mL) RV SV: 45 mL (Normal 71-141 mL) CO: 2.5 L/min (Normal 2.8-8.8 L/min) Indexed volumes: RV EDV: 59 mL/sq-m (Normal 58-109 mL/sq-m) RV ESV: 35 mL/sq-m (Normal 12-46 mL/sq-m) RV SV: 24 mL/sq-m (Normal 38-71 mL/sq-m) CI: 1.3 L/min/sq-m (Normal 1.7-4.2 L/min/sq-m) Left atrium: Normal size. Right atrium: Normal size. Mitral valve: Normal mitral valve without regurgitation. Aortic valve: Tricuspid aortic valve with  moderate regurgitation (RF 23%). Tricuspid valve: Normal tricuspid valve without regurgitation. Pulmonic valve: Normal pulmonary valve without regurgitation. Aorta: The aortic root and proximal ascending aorta are normal in diameter. Pericardium: The pericardium is normal thickness. There is no pericardial effusion. Systemic flow across the aortic valve (QS) was 4.3 L/min. Pulmonary flow across the pulmonic valve (QP) was 2.9  L/min. The QP:QS calculated across the aortic and pulmonic valves was 0.8. Native myocardial T1-relaxation times were normal at 1005 ms. Native myocardial T2-relaxation times were normal at 47 ms. ECV was mildly abnormal at 32%. Delayed Enhancement: Subendocardial delayed enhancement of the basal inferior/inferolateral segments consistent with RCA infarction. LGE scar is <50% transmural indicating viable myocardium. Subendocardial delayed enhancement of the basal anterior segment consistent with LAD infarction. LGE scar is <50% transmural indicating viable myocardium. Subendocardial delayed enhancement of the basal to mid anterolateral segment consistent with LCX infarction. LGE scar is >50% and transmural in the mid anterolateral segment indicating non-viable myocardium. There is no evidence of intracardiac thrombus. Extracardiac structures: No significant findings IMPRESSION: 1. The left ventricle is normal in cavity size and wall thickness. Systolic function was severely reduced, LVEF 24%. Hypokinesis of the basal inferior/inferolateral segments. Hypokinesis of the basal to mid anterior/anterolateral segments. Akinesis of the mid anterolateral segment. 2. The right ventricle is normal in cavity size with mildly reduced systolic function, RVEF 41%. 3. Tricuspid aortic valve with moderate regurgitation (RF 23%). 4. Evidence of basal inferior/inferolateral infarction (RCA) and this area is viable. Evidence of basal anterior infarction (LAD) and this area is viable. Evidence of basal to mid  anterolateral infarction (LCX) and this area is not viable (>50% LGE and transmural infarction in the mid anterolateral segment. 5. No evidence of LV thrombus. 6. Findings consistent with ischemic cardiomyopathy (multivessel CAD) with viable and non-viable myocardium described above. Electronically Signed   By: Darryle Decent M.D.   On: 07/22/2024 13:32    Cardiac Studies  2D echocardiogram (07/20/2024)  IMPRESSIONS     1. Left ventricular ejection fraction, by estimation, is 35 to 40%. The  left ventricle has moderately decreased function. The left ventricle  demonstrates regional wall motion abnormalities (see scoring  diagram/findings for description). Left ventricular   diastolic parameters are consistent with Grade I diastolic dysfunction  (impaired relaxation). Elevated left ventricular end-diastolic pressure.  The E/e' is 16.   2. Right ventricular systolic function is normal. The right ventricular  size is normal.   3. The mitral valve is degenerative. Mild mitral valve regurgitation.   4. The aortic valve is calcified. Unable to determine aortic valve  morphology due to image quality. Aortic valve regurgitation is moderate.  Probable moderate low flow low gradient aortic stenosis (peak velocity  2.81m/s, MG , AVA VTI 1.12cm2, DI  0.36, SVi 32).   5. The inferior vena cava is normal in size with greater than 50%  respiratory variability, suggesting right atrial pressure of 3 mmHg.   Comparison(s): A prior study was performed on 05/31/2022.  LVEF 50%, Grade  I diastolic dysfunction, Mild AS and AR.   Conclusion(s)/Recommendation(s): Unable to exclude a small apical left  ventricular thrombus, consider cMRI for further evaluation.   Patient Profile     Cameron Hoover is a 68 y.o. 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  He is on clopidogrel  apparently for prior stroke.  Aspirin  was started during his hospitalization though he has not been on in the past because of GI bleed.  He was admitted with chest pain and shortness of breath.  He thought his chest pain was related to GERD but it did not improve with antacids.  EKG showed LVH with repolarization changes.  Troponins were low and flat.  Cardiac MRI performed yesterday unfortunately did not have a stress component.  There were multiple wall motion abnormalities consistent with ischemic cardiomyopathy.  Based on this, I decided to proceed with diagnostic coronary angiography to define his anatomy.  2: Heart failure with reduced EF-EF 2 years ago was 50% with global hypokinesia, EF during this hospitalization was 35 to 40%.  Medications were adjusted for GDMT.  Cardiac MRI revealed an EF of 24%.  His lungs are clear on exam.  3: Valvular heart disease-2D echo did showed moderate AI/AS.  See MRI scheduled for today.  4: Hyperlipidemia-on statin drugs.  Lipitor  increased to 80 mg.  5: Anemia-hemoglobin was 14 a year ago, 11.5 currently.  Unfortunately the stress component of the cardiac MRI was not able to be performed.  The patient does have multiple wall motion abnormalities consistent with ischemic cardiomyopathy.  Importantly, there is no LV thrombus noted.  Based on this we have decided to proceed with diagnostic coronary angiography.  Further recommendations based on those results..    For questions or updates, please contact [facility] Please consult [email or web address] for contact info under     Signed, Dorn Lesches, MD  07/23/2024, 7:58 AM    Addendum- Cath showed patent LAD stent with a RI lesion in a small vessel. 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 OK for DC home on current meds. Can add low dow Coreg . We aill arrange OP F/U in  Advanced [facility] clinic to titrate GDMT.  Dorn DOROTHA Lesches, M.D., FACP, [facility], FAHA, [facility]  [address] [place], [place]  [address]  [phone] 07/23/2024 4:26 PM  "

## 2024-07-24 ENCOUNTER — Encounter (HOSPITAL_COMMUNITY): Payer: Self-pay | Admitting: Cardiology

## 2024-07-29 ENCOUNTER — Ambulatory Visit (HOSPITAL_COMMUNITY)
Admit: 2024-07-29 | Discharge: 2024-07-29 | Disposition: A | Discharge status: Home / Self Care | Attending: Physician Assistant | Admitting: Physician Assistant

## 2024-07-29 ENCOUNTER — Ambulatory Visit (HOSPITAL_COMMUNITY): Payer: Self-pay | Admitting: Physician Assistant

## 2024-07-29 ENCOUNTER — Encounter (HOSPITAL_COMMUNITY): Payer: Self-pay

## 2024-07-29 VITALS — BP 110/60 | HR 62 | Ht 66.0 in | Wt 166.0 lb

## 2024-07-29 DIAGNOSIS — I110 Hypertensive heart disease with heart failure: Secondary | ICD-10-CM | POA: Diagnosis not present

## 2024-07-29 DIAGNOSIS — I5020 Unspecified systolic (congestive) heart failure: Secondary | ICD-10-CM | POA: Diagnosis not present

## 2024-07-29 DIAGNOSIS — Z955 Presence of coronary angioplasty implant and graft: Secondary | ICD-10-CM | POA: Insufficient documentation

## 2024-07-29 DIAGNOSIS — D509 Iron deficiency anemia, unspecified: Secondary | ICD-10-CM | POA: Insufficient documentation

## 2024-07-29 DIAGNOSIS — F17210 Nicotine dependence, cigarettes, uncomplicated: Secondary | ICD-10-CM | POA: Insufficient documentation

## 2024-07-29 DIAGNOSIS — I10 Essential (primary) hypertension: Secondary | ICD-10-CM

## 2024-07-29 DIAGNOSIS — Z8673 Personal history of transient ischemic attack (TIA), and cerebral infarction without residual deficits: Secondary | ICD-10-CM | POA: Insufficient documentation

## 2024-07-29 DIAGNOSIS — Z79899 Other long term (current) drug therapy: Secondary | ICD-10-CM | POA: Insufficient documentation

## 2024-07-29 DIAGNOSIS — I2510 Atherosclerotic heart disease of native coronary artery without angina pectoris: Secondary | ICD-10-CM | POA: Diagnosis not present

## 2024-07-29 DIAGNOSIS — Z7902 Long term (current) use of antithrombotics/antiplatelets: Secondary | ICD-10-CM | POA: Insufficient documentation

## 2024-07-29 DIAGNOSIS — Z7984 Long term (current) use of oral hypoglycemic drugs: Secondary | ICD-10-CM | POA: Insufficient documentation

## 2024-07-29 DIAGNOSIS — E785 Hyperlipidemia, unspecified: Secondary | ICD-10-CM | POA: Insufficient documentation

## 2024-07-29 DIAGNOSIS — I5022 Chronic systolic (congestive) heart failure: Secondary | ICD-10-CM | POA: Insufficient documentation

## 2024-07-29 LAB — COMPREHENSIVE METABOLIC PANEL WITH GFR
ALT: 16 U/L (ref 0–44)
AST: 21 U/L (ref 15–41)
Albumin: 4.3 g/dL (ref 3.5–5.0)
Alkaline Phosphatase: 129 U/L — ABNORMAL HIGH (ref 38–126)
Anion gap: 7 (ref 5–15)
BUN: 21 mg/dL (ref 8–23)
CO2: 26 mmol/L (ref 22–32)
Calcium: 8.8 mg/dL — ABNORMAL LOW (ref 8.9–10.3)
Chloride: 103 mmol/L (ref 98–111)
Creatinine, Ser: 0.95 mg/dL (ref 0.61–1.24)
GFR, Estimated: >60 mL/min
Glucose, Bld: 106 mg/dL — ABNORMAL HIGH (ref 70–99)
Potassium: 4.5 mmol/L (ref 3.5–5.1)
Sodium: 136 mmol/L (ref 135–145)
Total Bilirubin: 0.2 mg/dL (ref 0.0–1.2)
Total Protein: 7.9 g/dL (ref 6.5–8.1)

## 2024-07-29 LAB — PRO BRAIN NATRIURETIC PEPTIDE: Pro Brain Natriuretic Peptide: 52.2 pg/mL

## 2024-07-29 MED ORDER — SPIRONOLACTONE 25 MG PO TABS: 25.0000 mg | ORAL_TABLET | Freq: Every day | ORAL | 2 refills | Status: AC

## 2024-07-29 MED ORDER — CLOPIDOGREL BISULFATE 75 MG PO TABS: 75.0000 mg | ORAL_TABLET | Freq: Every day | ORAL | 3 refills | Status: AC

## 2024-07-29 MED ORDER — EMPAGLIFLOZIN 10 MG PO TABS: 10.0000 mg | ORAL_TABLET | Freq: Every day | ORAL | 2 refills | Status: AC

## 2024-07-29 MED ORDER — CARVEDILOL 3.125 MG PO TABS: 3.1250 mg | ORAL_TABLET | Freq: Two times a day (BID) | ORAL | 2 refills | Status: DC

## 2024-07-29 MED ORDER — SACUBITRIL-VALSARTAN 24-26 MG PO TABS: 1.0000 | ORAL_TABLET | Freq: Two times a day (BID) | ORAL | 2 refills | Status: AC

## 2024-07-29 MED ORDER — ATORVASTATIN CALCIUM 80 MG PO TABS: 80.0000 mg | ORAL_TABLET | Freq: Every day | ORAL | 2 refills | Status: AC

## 2024-07-29 NOTE — Patient Instructions (Signed)
 Medication Changes:  No Changes In Medications at this time.   Lab Work:  Labs done today, your results will be available in MyChart, we will contact you for abnormal readings.  Follow-Up in: 3 weeks as scheduled with Dr. Mclean   At the [facility], you and your health needs are our priority. We have a designated team specialized in the treatment of Heart Failure. This Care Team includes your primary Heart Failure Specialized Cardiologist (physician), Advanced Practice Providers (APPs- Physician Assistants and Nurse Practitioners), and Pharmacist who all work together to provide you with the care you need, when you need it.   You may see any of the following providers on your designated Care Team at your next follow up:  Dr. Toribio Fuel Dr. Ezra Shuck Dr. Odis Brownie Greig Mosses, NP Caffie Shed, [place] [facility] [place], [place] Beckey Coe, NP Jordan Lee, NP Tinnie Redman, PharmD   Please be sure to bring in all your medications bottles to every appointment.   Need to Contact Us :  If you have any questions or concerns before your next appointment please send us  a message through [facility] or call our office at [phone].    TO LEAVE A MESSAGE FOR THE NURSE SELECT OPTION 2, PLEASE LEAVE A MESSAGE INCLUDING: YOUR NAME DATE OF BIRTH CALL BACK NUMBER REASON FOR CALL**this is important as we prioritize the call backs  YOU WILL RECEIVE A CALL BACK THE SAME DAY AS LONG AS YOU CALL BEFORE 4:00 PM
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
## 2024-07-30 ENCOUNTER — Telehealth (HOSPITAL_COMMUNITY): Payer: Self-pay | Admitting: Pharmacist

## 2024-07-30 NOTE — Telephone Encounter (Signed)
 Patient referred to infusion pharmacy team for ambulatory infusion of IV iron.  Insurance - [employer] Site of care - Site of care: [facility] Dx code - D50.9 IV Iron Therapy - Venofer 300mg  x 3 Infusion appointments - Scheduling team will schedule patient as soon as possible.   Sherry Pennant, PharmD, MPH, BCPS, CPP Clinical Pharmacist

## 2024-08-13 ENCOUNTER — Telehealth: Payer: Self-pay

## 2024-08-13 ENCOUNTER — Encounter: Payer: Self-pay | Admitting: Physician Assistant

## 2024-08-13 NOTE — Telephone Encounter (Signed)
 Auth Submission: NO AUTH NEEDED Site of care: Site of care: [facility] Payer: [employer] Medication & CPT/J Code(s) submitted: Venofer (Iron Sucrose) J1756 Diagnosis Code:  Route of submission (phone, fax, portal): portal Phone # Fax # Auth type: Buy/Bill PB Units/visits requested: 300mg  x 3 doses Reference number:  Approval from: 08/13/24 to 07/31/25

## 2024-08-19 ENCOUNTER — Telehealth (HOSPITAL_COMMUNITY): Payer: Self-pay | Admitting: Cardiology

## 2024-08-19 NOTE — Telephone Encounter (Signed)
 Called to confirm/remind patient of their appointment at the Advanced [facility] Failure Clinic on 08/19/2024.   Appointment:   [] Confirmed  [x] Left mess   [] No answer/No voice mail  [] VM Full/unable to leave message  [] Phone not in service  Patient reminded to bring all medications and/or complete list.  Confirmed patient has transportation. Gave directions, instructed to utilize valet parking.

## 2024-08-20 ENCOUNTER — Telehealth (HOSPITAL_COMMUNITY): Payer: Self-pay | Admitting: *Deleted

## 2024-08-20 ENCOUNTER — Ambulatory Visit (HOSPITAL_COMMUNITY)
Admission: RE | Admit: 2024-08-20 | Discharge: 2024-08-20 | Disposition: A | Discharge status: Home / Self Care | Source: Ambulatory Visit | Attending: Cardiology | Admitting: Cardiology

## 2024-08-20 ENCOUNTER — Ambulatory Visit (HOSPITAL_COMMUNITY): Payer: Self-pay | Admitting: Cardiology

## 2024-08-20 ENCOUNTER — Telehealth (HOSPITAL_COMMUNITY): Payer: Self-pay | Admitting: Pharmacist

## 2024-08-20 ENCOUNTER — Encounter (HOSPITAL_COMMUNITY): Payer: Self-pay | Admitting: Cardiology

## 2024-08-20 VITALS — BP 110/58 | HR 76 | Wt 161.0 lb

## 2024-08-20 DIAGNOSIS — Z8673 Personal history of transient ischemic attack (TIA), and cerebral infarction without residual deficits: Secondary | ICD-10-CM | POA: Diagnosis not present

## 2024-08-20 DIAGNOSIS — I429 Cardiomyopathy, unspecified: Secondary | ICD-10-CM | POA: Diagnosis not present

## 2024-08-20 DIAGNOSIS — I2510 Atherosclerotic heart disease of native coronary artery without angina pectoris: Secondary | ICD-10-CM | POA: Diagnosis not present

## 2024-08-20 DIAGNOSIS — F17210 Nicotine dependence, cigarettes, uncomplicated: Secondary | ICD-10-CM | POA: Diagnosis not present

## 2024-08-20 DIAGNOSIS — Z955 Presence of coronary angioplasty implant and graft: Secondary | ICD-10-CM | POA: Insufficient documentation

## 2024-08-20 DIAGNOSIS — Z79899 Other long term (current) drug therapy: Secondary | ICD-10-CM | POA: Insufficient documentation

## 2024-08-20 DIAGNOSIS — I350 Nonrheumatic aortic (valve) stenosis: Secondary | ICD-10-CM | POA: Insufficient documentation

## 2024-08-20 DIAGNOSIS — R0602 Shortness of breath: Secondary | ICD-10-CM | POA: Insufficient documentation

## 2024-08-20 DIAGNOSIS — I110 Hypertensive heart disease with heart failure: Secondary | ICD-10-CM | POA: Diagnosis not present

## 2024-08-20 DIAGNOSIS — D509 Iron deficiency anemia, unspecified: Secondary | ICD-10-CM

## 2024-08-20 DIAGNOSIS — Z7984 Long term (current) use of oral hypoglycemic drugs: Secondary | ICD-10-CM | POA: Insufficient documentation

## 2024-08-20 DIAGNOSIS — I5042 Chronic combined systolic (congestive) and diastolic (congestive) heart failure: Secondary | ICD-10-CM | POA: Diagnosis present

## 2024-08-20 DIAGNOSIS — E785 Hyperlipidemia, unspecified: Secondary | ICD-10-CM | POA: Insufficient documentation

## 2024-08-20 DIAGNOSIS — I5022 Chronic systolic (congestive) heart failure: Secondary | ICD-10-CM | POA: Insufficient documentation

## 2024-08-20 DIAGNOSIS — Z7902 Long term (current) use of antithrombotics/antiplatelets: Secondary | ICD-10-CM | POA: Insufficient documentation

## 2024-08-20 LAB — BASIC METABOLIC PANEL WITH GFR
Anion gap: 7 (ref 5–15)
BUN: 21 mg/dL (ref 8–23)
CO2: 27 mmol/L (ref 22–32)
Calcium: 9.2 mg/dL (ref 8.9–10.3)
Chloride: 101 mmol/L (ref 98–111)
Creatinine, Ser: 0.96 mg/dL (ref 0.61–1.24)
GFR, Estimated: >60 mL/min
Glucose, Bld: 83 mg/dL (ref 70–99)
Potassium: 4.4 mmol/L (ref 3.5–5.1)
Sodium: 135 mmol/L (ref 135–145)

## 2024-08-20 LAB — LIPID PANEL
Cholesterol: 131 mg/dL (ref 0–200)
HDL: 40 mg/dL — ABNORMAL LOW
LDL Cholesterol: 76 mg/dL (ref 0–99)
Total CHOL/HDL Ratio: 3.3 ratio
Triglycerides: 78 mg/dL
VLDL: 16 mg/dL (ref 0–40)

## 2024-08-20 LAB — SALICYLATE LEVEL: Salicylate Lvl: <7 mg/dL — ABNORMAL LOW (ref 7.0–30.0)

## 2024-08-20 LAB — PRO BRAIN NATRIURETIC PEPTIDE: Pro Brain Natriuretic Peptide: 67.4 pg/mL

## 2024-08-20 MED ORDER — CARVEDILOL 6.25 MG PO TABS: 6.2500 mg | ORAL_TABLET | Freq: Two times a day (BID) | ORAL | 2 refills | Status: AC

## 2024-08-20 MED ORDER — VARENICLINE TARTRATE 0.5 MG PO TABS: ORAL_TABLET | ORAL | 0 refills | Status: AC

## 2024-08-20 NOTE — Telephone Encounter (Signed)
 Patient referred to infusion pharmacy team for ambulatory infusion of IV iron.  Patient has active Venofer plan in place from 07/30/2024. Venofer remains preferred IV iron formulation  Raejean Swinford, PharmD, MPH, BCPS, CPP Clinical Pharmacist

## 2024-08-20 NOTE — Telephone Encounter (Signed)
 Attempted to call patient regarding upcoming cardiac PET appointment. Left message on voicemail with name and callback number  Larey Brick RN Navigator Cardiac Imaging [facility] [phone] Office [phone] Cell

## 2024-08-20 NOTE — Patient Instructions (Signed)
 CHANGE Carvedilol  to 6.25 mg Twice daily  START Chantix  as directed on the box.  Labs done today, your results will be available in MyChart, we will contact you for abnormal readings.  You have been referred for an iron infusion. You will be called to have this appointment arranged.  Please follow up with our heart failure pharmacist in 3 weeks.  Your physician recommends that you schedule a follow-up appointment in: 2 months.  If you have any questions or concerns before your next appointment please send us  a message through [facility] or call our office at [phone].    TO LEAVE A MESSAGE FOR THE NURSE SELECT OPTION 2, PLEASE LEAVE A MESSAGE INCLUDING: YOUR NAME DATE OF BIRTH CALL BACK NUMBER REASON FOR CALL**this is important as we prioritize the call backs  YOU WILL RECEIVE A CALL BACK THE SAME DAY AS LONG AS YOU CALL BEFORE 4:00 PM  At the [facility], you and your health needs are our priority. As part of our continuing mission to provide you with exceptional heart care, we have created designated Provider Care Teams. These Care Teams include your primary Cardiologist (physician) and Advanced Practice Providers (APPs- Physician Assistants and Nurse Practitioners) who all work together to provide you with the care you need, when you need it.   You may see any of the following providers on your designated Care Team at your next follow up: Dr Toribio Fuel Dr Ezra Shuck Dr. Morene Brownie Greig Mosses, NP Caffie Shed, [place] [facility] [place], [place] Beckey Coe, NP Jordan Lee, NP Ellouise Class, NP Tinnie Redman, PharmD Jaun Bash, PharmD   Please be sure to bring in all your medications bottles to every appointment.    Thank you for choosing [facility]

## 2024-08-20 NOTE — Progress Notes (Signed)
 "   PCP: Benjamin Raina Elizabeth, NP HF Cardiology: Dr. Rolan  Chief complaint: CHF  HPI:  Cameron Hoover is a 69 y.o. 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    He presented to [facility] ED on 07/19/24 with shortness of breath and chest pain.  Cardiology consulted.  GDMT titrated. Echo with EF 35 to 40%, regional wall motion abnormalities, RV okay, probable moderate low flow, low gradient aortic stenosis with mean gradient of 12 mmHg aortic valve area 1.12 cm. cMRI showed LVEF 24%, RVEF 41%, multiple R WMA w/ evidence of RCA and LAD territory infarcts (viable) and nonviable LCX territory infarct, moderate AI. Subsequent LHC with just mild to moderate nonobstructive CAD.  Patient returns for followup of CHF.  He is still smoking 1 ppd.  He is using Chantix  to try to quit.  No chest pain, and GERD symptoms have resolved with Protonix .  He gets short of breath walking long distances.  No orthopnea/PND.  No orthopnea/PND.   ECG (personally reviewed): NSR, LVH with repolarization abnormality.   Labs (12/25): pro-BNP 52, K 4.5, creatinine 0.95  PMH: 1. CAD: MI with LAD PCI in 2016.  - LHC (12/25): 60% ramus stenosis, patent mid LAD stent.  2. CVA in 2023 3. Aortic stenosis: Echo in 12/25 with suspected low flow/low gradient moderate AS.  4. Fe deficiency anemia 5. GERD 6. HTN 7. Chronic systolic CHF: Echo in 12/25 showed EF 35 to 40%, regional wall motion abnormalities, RV okay, probable moderate low flow, low gradient aortic stenosis with mean gradient of 12 mmHg aortic valve area 1.12 cm.  - Cardiac MRI (12/25): LV EF 24%, RV EF 41%, subendocardial LGE in the basal inferior/inferolateral walls, basal anterior wall, and the basal to mid anterolateral wall.   8. COPD: CT chest  (12/25) with emphysema, hilar and mediastinal lymphadenopathy.  Active smoker.   SH: Retired, 4 children, separated.  Smokes 1 ppd.  Occasional ETOH.   FH: Mother and father passed away from cancer. Oldest brother died from MI. Another brother and his sister passed from cancer. Uncles with CAD. No known family history of CHF.  Current Outpatient Medications  Medication Sig Dispense Refill   atorvastatin  (LIPITOR ) 80 MG tablet Take 1 tablet (80 mg total) by mouth daily. 30 tablet 2   clopidogrel  (PLAVIX ) 75 MG tablet Take 1 tablet (75 mg total) by mouth daily. 90 tablet 3   empagliflozin  (JARDIANCE ) 10 MG TABS tablet Take 1 tablet (10 mg total) by mouth daily. 30 tablet 2   gabapentin  (NEURONTIN ) 300 MG capsule Take 300 mg by mouth 3 (three) times daily.     HYDROcodone -acetaminophen  (NORCO/VICODIN) 5-325 MG tablet Take 1 tablet by mouth every 12 (twelve) hours.     pantoprazole  (PROTONIX ) 40 MG tablet Take 1 tablet (40 mg total) by mouth 2 (two) times daily. 30 tablet 0   sacubitril -valsartan  (ENTRESTO ) 24-26 MG Take 1 tablet by mouth 2 (two) times daily. 60 tablet 2   spironolactone  (ALDACTONE ) 25 MG tablet Take 1 tablet (25 mg total) by mouth daily. 30 tablet 2   varenicline  (CHANTIX ) 0.5 MG tablet DAY 1-3 TAKE 1 TABLET DAILY. DAY 4-7 TAKE 1 TAB Twice daily, DAY 8 ONWARDS 2 TABS Twice daily 54 tablet 0   carvedilol  (COREG ) 6.25  MG tablet Take 1 tablet (6.25 mg total) by mouth 2 (two) times daily. 180 tablet 2   No current facility-administered medications for this encounter.    Allergies[1]    Vitals:   08/20/24 0918  BP: (!) 110/58  Pulse: 76  SpO2: 98%  Weight: 73 kg (161 lb)    PHYSICAL EXAM: General: NAD Neck: No JVD, no thyromegaly or thyroid  nodule.  Lungs: Clear to auscultation bilaterally with normal respiratory effort. CV: Nondisplaced PMI.  Heart regular S1/S2, no S3/S4, no murmur.  No peripheral edema.  No carotid bruit.  Normal pedal pulses.  Abdomen: Soft,  nontender, no hepatosplenomegaly, no distention.  Skin: Intact without lesions or rashes.  Neurologic: Alert and oriented x 3.  Psych: Normal affect. Extremities: No clubbing or cyanosis.  HEENT: Normal.   ASSESSMENT & PLAN: 1.  Chronic systolic CHF: ?Ischemic cardiomyopathy vs mixed ischemic/nonischemic.  MI in 2016 with DES to LAD.  Echo in 12/25 showed EF 35 to 40%, regional wall motion abnormalities, RV okay, probable moderate low flow, low gradient aortic stenosis with mean gradient of 12 mmHg aortic valve area 1.12 cm. Cardiac MRI in 12/25 was suggestive of ischemic cardiomyopathy, showing LV EF 24%, RV EF 41%, subendocardial LGE in the basal inferior/inferolateral walls, basal anterior wall, and the basal to mid anterolateral wall.   However, cath in 12/25 showed patent LAD stent and otherwise nonobstructive CAD.  With extensive LGE pattern but nonobstructive CAD as well as hilar and mediastinal lymphadenopathy on cardiac MRI, I think we need to rule out cardiac sarcoidosis.  He is not volume overloaded on exam, NYHA class II symptoms.  - Increase Coreg  to 6.25 mg BID  - Continue Jardiance  10 mg daily - Continue Entresto  24/26 mg BID - Continue spironolactone  25 mg daily.  BMET/BNP today.  - I will arrange for cardiac PET to assess for evidence for cardiac sarcoidosis. I will also send ACE level.  - Echo 6/26, ?ICD candidate.  2. CAD: MI with stent to mid LAD in 2016.  No obstructive CAD on 12/25 LHC.  - Continue Plavix .  - Continue atorvastatin  80 mg daily.  Check lipids today.  3. HTN: BP not elevated.  4. Hx CVA - Plavix  + statin 5. Iron deficiency anemia: Arrange for IV Fe.   Follow up: 3 wks with HF pharmacist for med titration. See APP in 2 months.   I spent 56 minutes reviewing records, interviewing/examining patient, and managing orders.   Ezra Shuck 08/20/2024      [1] No Known Allergies  "

## 2024-08-22 ENCOUNTER — Encounter (HOSPITAL_COMMUNITY)
Admission: RE | Admit: 2024-08-22 | Discharge: 2024-08-22 | Disposition: A | Discharge status: Home / Self Care | Source: Ambulatory Visit | Attending: Physician Assistant | Admitting: Physician Assistant

## 2024-08-27 ENCOUNTER — Encounter: Attending: Physician Assistant | Admitting: Emergency Medicine

## 2024-08-27 VITALS — BP 126/71 | HR 48 | Temp 97.7°F | Resp 16

## 2024-08-27 DIAGNOSIS — D509 Iron deficiency anemia, unspecified: Secondary | ICD-10-CM

## 2024-08-27 MED ORDER — DIPHENHYDRAMINE HCL 25 MG PO CAPS
25.0000 mg | ORAL_CAPSULE | Freq: Once | ORAL | Status: AC
  Administered 2024-08-27: 25 mg via ORAL

## 2024-08-27 MED ORDER — ACETAMINOPHEN 325 MG PO TABS
650.0000 mg | ORAL_TABLET | Freq: Once | ORAL | Status: AC
  Administered 2024-08-27: 650 mg via ORAL

## 2024-08-27 MED ORDER — SODIUM CHLORIDE 0.9 % IV SOLN
300.0000 mg | Freq: Once | INTRAVENOUS | Status: AC
  Administered 2024-08-27: 300 mg via INTRAVENOUS
  Filled 2024-08-27: qty 10

## 2024-08-27 NOTE — Progress Notes (Signed)
 Diagnosis: Iron  Deficiency Anemia  Provider:   Colletta Manuelita Garre, PA-C    Procedure: IV Infusion  IV Type: Peripheral, IV Location: L Antecubital  Venofer  (Iron  Sucrose), Dose: 300 mg  Infusion Start Time: 1233  Infusion Stop Time: 1410  Post Infusion IV Care: Observation period completed and Peripheral IV Discontinued  Discharge: Condition: Good, Destination: Home . AVS Declined  Performed by:  Delon ONEIDA Officer, RN

## 2024-09-02 ENCOUNTER — Ambulatory Visit: Admitting: Gastroenterology

## 2024-09-03 ENCOUNTER — Ambulatory Visit

## 2024-09-04 ENCOUNTER — Other Ambulatory Visit: Payer: Self-pay | Admitting: Gastroenterology

## 2024-09-09 ENCOUNTER — Ambulatory Visit

## 2024-09-10 ENCOUNTER — Ambulatory Visit (HOSPITAL_COMMUNITY)

## 2024-09-19 ENCOUNTER — Encounter (HOSPITAL_COMMUNITY)

## 2024-10-18 ENCOUNTER — Ambulatory Visit (HOSPITAL_COMMUNITY)

## 2024-10-30 ENCOUNTER — Ambulatory Visit: Admitting: Gastroenterology

## 2024-11-29 ENCOUNTER — Ambulatory Visit: Payer: Self-pay
